# Patient Record
Sex: Female | Born: 1950 | Race: White | Hispanic: No | Smoking: Never smoker
Health system: Southern US, Community
[De-identification: ages and names within clinical notes are randomized; demographics above are authoritative.]

## PROBLEM LIST (undated history)

## (undated) DIAGNOSIS — T4145XA Adverse effect of unspecified anesthetic, initial encounter: Secondary | ICD-10-CM

## (undated) DIAGNOSIS — D469 Myelodysplastic syndrome, unspecified: Secondary | ICD-10-CM

## (undated) DIAGNOSIS — R112 Nausea with vomiting, unspecified: Secondary | ICD-10-CM

## (undated) DIAGNOSIS — T8859XA Other complications of anesthesia, initial encounter: Secondary | ICD-10-CM

## (undated) DIAGNOSIS — N189 Chronic kidney disease, unspecified: Secondary | ICD-10-CM

## (undated) DIAGNOSIS — I00 Rheumatic fever without heart involvement: Secondary | ICD-10-CM

## (undated) DIAGNOSIS — K219 Gastro-esophageal reflux disease without esophagitis: Secondary | ICD-10-CM

## (undated) DIAGNOSIS — M1990 Unspecified osteoarthritis, unspecified site: Secondary | ICD-10-CM

## (undated) DIAGNOSIS — IMO0001 Reserved for inherently not codable concepts without codable children: Secondary | ICD-10-CM

## (undated) DIAGNOSIS — A498 Other bacterial infections of unspecified site: Secondary | ICD-10-CM

## (undated) DIAGNOSIS — I10 Essential (primary) hypertension: Secondary | ICD-10-CM

## (undated) DIAGNOSIS — R768 Other specified abnormal immunological findings in serum: Secondary | ICD-10-CM

## (undated) DIAGNOSIS — A4901 Methicillin susceptible Staphylococcus aureus infection, unspecified site: Secondary | ICD-10-CM

## (undated) DIAGNOSIS — E785 Hyperlipidemia, unspecified: Secondary | ICD-10-CM

## (undated) DIAGNOSIS — G43109 Migraine with aura, not intractable, without status migrainosus: Secondary | ICD-10-CM

## (undated) DIAGNOSIS — Z9889 Other specified postprocedural states: Secondary | ICD-10-CM

## (undated) DIAGNOSIS — K759 Inflammatory liver disease, unspecified: Secondary | ICD-10-CM

## (undated) DIAGNOSIS — Z87442 Personal history of urinary calculi: Secondary | ICD-10-CM

## (undated) DIAGNOSIS — E119 Type 2 diabetes mellitus without complications: Secondary | ICD-10-CM

## (undated) DIAGNOSIS — D649 Anemia, unspecified: Secondary | ICD-10-CM

## (undated) HISTORY — DX: Anemia, unspecified: D64.9

## (undated) HISTORY — PX: ENDOMETRIAL ABLATION: SHX621

## (undated) HISTORY — DX: Reserved for inherently not codable concepts without codable children: IMO0001

## (undated) HISTORY — DX: Unspecified osteoarthritis, unspecified site: M19.90

## (undated) HISTORY — DX: Chronic kidney disease, unspecified: N18.9

## (undated) HISTORY — DX: Migraine with aura, not intractable, without status migrainosus: G43.109

## (undated) HISTORY — DX: Personal history of urinary calculi: Z87.442

## (undated) HISTORY — DX: Methicillin susceptible Staphylococcus aureus infection, unspecified site: A49.01

## (undated) HISTORY — DX: Gastro-esophageal reflux disease without esophagitis: K21.9

## (undated) HISTORY — DX: Other specified abnormal immunological findings in serum: R76.8

## (undated) HISTORY — DX: Other bacterial infections of unspecified site: A49.8

## (undated) HISTORY — PX: CATARACT EXTRACTION: SUR2

## (undated) HISTORY — DX: Type 2 diabetes mellitus without complications: E11.9

## (undated) HISTORY — DX: Myelodysplastic syndrome, unspecified: D46.9

## (undated) HISTORY — DX: Hyperlipidemia, unspecified: E78.5

## (undated) HISTORY — DX: Essential (primary) hypertension: I10

---
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
## 1986-05-14 HISTORY — PX: TUBAL LIGATION: SHX77

## 2005-02-20 ENCOUNTER — Ambulatory Visit: Payer: Self-pay | Admitting: Rheumatology

## 2009-06-30 ENCOUNTER — Ambulatory Visit: Payer: Self-pay | Admitting: Unknown Physician Specialty

## 2009-07-07 ENCOUNTER — Ambulatory Visit: Payer: Self-pay | Admitting: Family Medicine

## 2010-09-14 ENCOUNTER — Ambulatory Visit: Payer: Self-pay | Admitting: Family Medicine

## 2010-09-15 ENCOUNTER — Observation Stay (HOSPITAL_COMMUNITY)
Admission: EM | Admit: 2010-09-15 | Discharge: 2010-09-15 | Disposition: A | Discharge status: Home / Self Care | Payer: BC Managed Care – PPO | Attending: Neurosurgery | Admitting: Neurosurgery

## 2010-09-15 DIAGNOSIS — M51379 Other intervertebral disc degeneration, lumbosacral region without mention of lumbar back pain or lower extremity pain: Principal | ICD-10-CM | POA: Insufficient documentation

## 2010-09-15 DIAGNOSIS — M5137 Other intervertebral disc degeneration, lumbosacral region: Secondary | ICD-10-CM | POA: Insufficient documentation

## 2010-09-17 NOTE — H&P (Signed)
NAME:  Katie Singh, PELAGIO NO.:  [id number]  MEDICAL RECORD NO.:  [id number]           PATIENT TYPE:  O  LOCATION:  [id number]                         FACILITY:  [facility]  PHYSICIAN:  Leeroy Cha, M.D.   DATE OF BIRTH:  [birth year]  DATE OF ADMISSION:  09/15/2010 DATE OF DISCHARGE:  09/15/2010                             HISTORY & PHYSICAL   Ms. Verret is a 60 year old female who my office got call for her to be transfer from emergency because of the possibility of cauda equina syndrome.  The patient was seen by her medical doctor about 2 days ago. At that time according to the chart, the patient was complaining of some short of breath, some chest pain, and also it was mentioned that once in a while, she wears her pants and once in a while she has some poor control of her bowel.  She denies any back pain.  She denies any pain into the legs, although she has a little bit of numbness in the right side of the thigh, which she is claiming secondary to her diabetes. Nevertheless, she ended up having emergency MRI yesterday morning and because of the findings of the MRI, we were called.  I was in surgery, and I was unable to look at the MRI, but the way the patient was described, we told them to keep her n.p.o. and we opened the OR ready to go ahead with surgery.  Finally, the patient came at about 4 o'clock in the afternoon.  She is awake and oriented x3.  She has no symptoms whatsoever.  She is standing and the only complaint that she has is numbness on the right side.  She complains of back pain off and on, but this is something that is cyclical with good days and bad days, but never complained of any shooting pain down to her legs or any pain down to her pelvic area.  In relation to bladder or bowel, she tells me that this is something which is coming to her.  This happened on several occasions, and she thinks that all of this is secondary to her 5 pregnancies.  She has not  been seen by the gynecologist in the past few years.  She denies any chest pain.  She denies any difficulty of breathing.  PAST MEDICAL HISTORY:  She has had 5 pregnancies.  She is taking folic acid, gabapentin, hydrochlorothiazide, Mobic, Paxil, prednisone, Protonix, vitamin 3.  FAMILY HISTORY:  Unremarkable.  SOCIAL HISTORY:  The patient does not smoke.  She occasionally drinks.  REVIEW OF SYSTEMS:  Positive for depression, diabetes, hypertension, and rheumatoid arthritis.  SURGERIES:  She has some type of thermal ablation of the uterus.  PHYSICAL EXAMINATION:  VITAL SIGNS:  Blood pressure 140/76, pulse 76, respiratory rate of 20. HEAD, EARS, NOSE, AND THROAT:  Normal. NECK:  She has good flexibility. CARDIOVASCULAR:  Normal. LUNGS:  Normal. ABDOMEN:  Normal. EXTREMITIES:  Normal pulses. NEURO:  She is oriented x3.  Strength normal in the upper and lower extremities.  Reflexes are symmetrical.  Sensation normal except for some numbness in the outside  of the right thigh.  She has a normal proprioception. MUSCULOSKELETAL:  She is able to walk in tip toes and heel and squat without any problem.  I reviewed the MRI myself first through the internet and later on the CDs she brought.  She has degenerative changes in multiple levels was mild bulging disk laterally in the left side at L5-1.  CLINICAL IMPRESSION:  Degenerative disk disease with a mild bulging disk at L5-1 to the left, which is neurologically asymptomatic and by clinical history or physical examination, there is no evidence of any radiculopathy and no cauda equina syndrome.  I talked to her and her husband who was present.  There is no point to admit her.  I was really worried because with history of chest pain, short of breath, I was worried that I may not be get hold of the cardiologist before surgery if he does know the case.  She is going to go home.  She has my phone number to give me a call as needed.  Of  course, she was advised that part of the workup.  Hopefully, she will go into a weight loss program. Also, I mentioned to her about the need to be seen by the gynecologist to rule out the possibility of cystocele.          ______________________________ Leeroy Cha, M.D.     EB/MEDQ  D:  09/15/2010  T:  09/16/2010  Job:  [id number]  Electronically Signed by Leeroy Cha M.D. on 09/17/2010 08:12:04 PM

## 2010-11-29 ENCOUNTER — Ambulatory Visit: Payer: Self-pay | Admitting: Family Medicine

## 2010-12-04 ENCOUNTER — Ambulatory Visit: Payer: Self-pay

## 2012-02-06 ENCOUNTER — Ambulatory Visit: Payer: Self-pay | Admitting: Family Medicine

## 2012-02-14 ENCOUNTER — Other Ambulatory Visit: Payer: Self-pay | Admitting: Unknown Physician Specialty

## 2012-03-10 ENCOUNTER — Encounter: Payer: Self-pay | Admitting: Unknown Physician Specialty

## 2012-03-10 ENCOUNTER — Ambulatory Visit: Payer: Self-pay | Admitting: Internal Medicine

## 2012-03-10 ENCOUNTER — Ambulatory Visit: Payer: Self-pay | Admitting: Specialist

## 2012-03-11 ENCOUNTER — Ambulatory Visit: Payer: Self-pay | Admitting: Internal Medicine

## 2012-03-11 LAB — CBC CANCER CENTER
Bands: 1 %
Basophil #: 0 x10 3/mm (ref 0.0–0.1)
Eosinophil #: 0.5 x10 3/mm (ref 0.0–0.7)
HGB: 8.6 g/dL — ABNORMAL LOW (ref 12.0–16.0)
Lymphocyte #: 1 x10 3/mm (ref 1.0–3.6)
MCH: 29.7 pg (ref 26.0–34.0)
MCHC: 33 g/dL (ref 32.0–36.0)
MCV: 90 fL (ref 80–100)
Monocyte #: 0.1 x10 3/mm — ABNORMAL LOW (ref 0.2–0.9)
Monocyte %: 4.1 %
Neutrophil #: 1.6 x10 3/mm (ref 1.4–6.5)
Other Cells Blood: 1 %
Platelet: 418 x10 3/mm (ref 150–440)
RBC: 2.89 10*6/uL — ABNORMAL LOW (ref 3.80–5.20)
RDW: 19 % — ABNORMAL HIGH (ref 11.5–14.5)
WBC: 3.2 x10 3/mm — ABNORMAL LOW (ref 3.6–11.0)

## 2012-03-11 LAB — PROTIME-INR
INR: 1
Prothrombin Time: 13.2 s

## 2012-03-11 LAB — FIBRINOGEN: Fibrinogen: 464 mg/dL (ref 210–470)

## 2012-03-11 LAB — RETICULOCYTES
Absolute Retic Count: 0.095 x10 6/uL
Reticulocyte: 3.3 % — ABNORMAL HIGH

## 2012-03-11 LAB — FOLATE: Folic Acid: 62.8 ng/mL

## 2012-03-11 LAB — APTT: Activated PTT: 30.3 secs (ref 23.6–35.9)

## 2012-03-11 LAB — FERRITIN: Ferritin (ARMC): 117 ng/mL

## 2012-03-11 LAB — IRON AND TIBC: Iron Bind.Cap.(Total): 358 ug/dL (ref 250–450)

## 2012-03-11 LAB — FIBRIN DEGRADATION PROD.(ARMC ONLY): Fibrin Degradation Prod.: <10 ug/ml (ref 2.1–7.7)

## 2012-03-11 LAB — LACTATE DEHYDROGENASE: LDH: 308 U/L — ABNORMAL HIGH

## 2012-03-13 LAB — OCCULT BLOOD X 1 CARD TO LAB, STOOL: Occult Blood, Feces: NEGATIVE

## 2012-03-14 ENCOUNTER — Ambulatory Visit: Payer: Self-pay | Admitting: Internal Medicine

## 2012-03-14 ENCOUNTER — Encounter: Payer: Self-pay | Admitting: Unknown Physician Specialty

## 2012-03-26 LAB — CBC CANCER CENTER
Basophil: 2 %
Lymphocytes: 27 %
MCHC: 31.3 g/dL — ABNORMAL LOW (ref 32.0–36.0)
Platelet: 383 x10 3/mm (ref 150–440)
RDW: 17.4 % — ABNORMAL HIGH (ref 11.5–14.5)
Variant Lymphocyte: 4 %

## 2012-04-13 ENCOUNTER — Ambulatory Visit: Payer: Self-pay | Admitting: Internal Medicine

## 2012-04-13 ENCOUNTER — Encounter: Payer: Self-pay | Admitting: Unknown Physician Specialty

## 2012-05-15 ENCOUNTER — Ambulatory Visit: Payer: Self-pay | Admitting: Internal Medicine

## 2012-05-15 LAB — HEPATIC FUNCTION PANEL A (ARMC)
Albumin: 3.1 g/dL — ABNORMAL LOW (ref 3.4–5.0)
SGOT(AST): 23 U/L (ref 15–37)
Total Protein: 8.6 g/dL — ABNORMAL HIGH (ref 6.4–8.2)

## 2012-05-15 LAB — CBC CANCER CENTER
Basophil #: 0.1 x10 3/mm (ref 0.0–0.1)
Basophil %: 1.7 %
Eosinophil %: 9.2 %
HCT: 35.8 % (ref 35.0–47.0)
Lymphocyte #: 1.6 x10 3/mm (ref 1.0–3.6)
Lymphocyte %: 32.3 %
MCHC: 31.6 g/dL — ABNORMAL LOW (ref 32.0–36.0)
Monocyte #: 0.5 x10 3/mm (ref 0.2–0.9)
RBC: 4.61 10*6/uL (ref 3.80–5.20)
RDW: 15.5 % — ABNORMAL HIGH (ref 11.5–14.5)

## 2012-05-15 LAB — CREATININE, SERUM: Creatinine: 2.5 mg/dL — ABNORMAL HIGH (ref 0.60–1.30)

## 2012-06-05 LAB — CBC CANCER CENTER
Eosinophil %: 13 %
HCT: 32.5 % — ABNORMAL LOW (ref 35.0–47.0)
Lymphocyte #: 1.4 x10 3/mm (ref 1.0–3.6)
MCH: 24.1 pg — ABNORMAL LOW (ref 26.0–34.0)
MCHC: 31.8 g/dL — ABNORMAL LOW (ref 32.0–36.0)
MCV: 76 fL — ABNORMAL LOW (ref 80–100)
Monocyte #: 0.3 x10 3/mm (ref 0.2–0.9)
Monocyte %: 8.6 %
Neutrophil %: 40.7 %
Platelet: 193 x10 3/mm (ref 150–440)
RBC: 4.28 10*6/uL (ref 3.80–5.20)
WBC: 3.8 x10 3/mm (ref 3.6–11.0)

## 2012-06-14 ENCOUNTER — Ambulatory Visit: Payer: Self-pay | Admitting: Internal Medicine

## 2012-07-03 LAB — CREATININE, SERUM
Creatinine: 2.82 mg/dL — ABNORMAL HIGH (ref 0.60–1.30)
EGFR (African American): 20 — ABNORMAL LOW

## 2012-07-03 LAB — CBC CANCER CENTER
Monocyte #: 0.4 x10 3/mm (ref 0.2–0.9)
Monocyte %: 8.3 %
Neutrophil %: 54.4 %
RBC: 4.13 10*6/uL (ref 3.80–5.20)
WBC: 5.1 x10 3/mm (ref 3.6–11.0)

## 2012-07-03 LAB — HEPATIC FUNCTION PANEL A (ARMC)
Albumin: 3.5 g/dL (ref 3.4–5.0)
Alkaline Phosphatase: 176 U/L — ABNORMAL HIGH (ref 50–136)
Bilirubin, Direct: 0.1 mg/dL (ref 0.00–0.20)
SGOT(AST): 21 U/L (ref 15–37)
Total Protein: 7.8 g/dL (ref 6.4–8.2)

## 2012-07-12 ENCOUNTER — Ambulatory Visit: Payer: Self-pay | Admitting: Internal Medicine

## 2012-08-14 ENCOUNTER — Ambulatory Visit: Payer: Self-pay | Admitting: Internal Medicine

## 2012-08-14 LAB — CBC CANCER CENTER
Basophil #: 0.1 x10 3/mm (ref 0.0–0.1)
Eosinophil #: 0.3 x10 3/mm (ref 0.0–0.7)
HCT: 32 % — ABNORMAL LOW (ref 35.0–47.0)
Lymphocyte #: 1.4 x10 3/mm (ref 1.0–3.6)
MCH: 24 pg — ABNORMAL LOW (ref 26.0–34.0)
MCHC: 31.9 g/dL — ABNORMAL LOW (ref 32.0–36.0)
Monocyte #: 0.3 x10 3/mm (ref 0.2–0.9)
Monocyte %: 6.8 %
Neutrophil %: 44.2 %
Platelet: 171 x10 3/mm (ref 150–440)
RDW: 16.3 % — ABNORMAL HIGH (ref 11.5–14.5)

## 2012-09-11 ENCOUNTER — Ambulatory Visit: Payer: Self-pay | Admitting: Internal Medicine

## 2012-11-06 ENCOUNTER — Ambulatory Visit: Payer: Self-pay | Admitting: Internal Medicine

## 2012-11-06 LAB — CBC CANCER CENTER
Eosinophil %: 6.3 %
HGB: 10.1 g/dL — ABNORMAL LOW (ref 12.0–16.0)
Lymphocyte %: 33.9 %
RBC: 4.27 10*6/uL (ref 3.80–5.20)
WBC: 3.6 x10 3/mm (ref 3.6–11.0)

## 2012-11-11 ENCOUNTER — Ambulatory Visit: Payer: Self-pay | Admitting: Internal Medicine

## 2013-06-03 ENCOUNTER — Ambulatory Visit: Payer: Self-pay | Admitting: Family Medicine

## 2013-06-03 LAB — RAPID INFLUENZA A&B ANTIGENS (ARMC ONLY)

## 2013-06-03 LAB — RAPID STREP-A WITH REFLX: Micro Text Report: POSITIVE

## 2013-12-22 ENCOUNTER — Ambulatory Visit: Payer: Self-pay | Admitting: Family Medicine

## 2014-01-28 ENCOUNTER — Encounter: Payer: Self-pay | Admitting: Rheumatology

## 2014-02-11 ENCOUNTER — Encounter: Payer: Self-pay | Admitting: Rheumatology

## 2014-03-29 DIAGNOSIS — K529 Noninfective gastroenteritis and colitis, unspecified: Secondary | ICD-10-CM | POA: Insufficient documentation

## 2014-11-07 ENCOUNTER — Other Ambulatory Visit: Payer: Self-pay | Admitting: Family Medicine

## 2014-11-11 ENCOUNTER — Other Ambulatory Visit: Payer: Self-pay | Admitting: Family Medicine

## 2014-11-11 MED ORDER — TRAMADOL HCL 50 MG PO TABS: 50.0000 mg | ORAL_TABLET | Freq: Three times a day (TID) | ORAL | Status: DC | PRN

## 2014-11-11 NOTE — Telephone Encounter (Signed)
Called in [place]

## 2014-12-29 ENCOUNTER — Telehealth: Payer: Self-pay

## 2014-12-29 DIAGNOSIS — E785 Hyperlipidemia, unspecified: Secondary | ICD-10-CM | POA: Insufficient documentation

## 2014-12-29 DIAGNOSIS — G4730 Sleep apnea, unspecified: Secondary | ICD-10-CM | POA: Insufficient documentation

## 2014-12-29 DIAGNOSIS — I10 Essential (primary) hypertension: Secondary | ICD-10-CM | POA: Insufficient documentation

## 2014-12-29 DIAGNOSIS — E1169 Type 2 diabetes mellitus with other specified complication: Secondary | ICD-10-CM | POA: Insufficient documentation

## 2014-12-29 MED ORDER — MELOXICAM 15 MG PO TABS: 15.0000 mg | ORAL_TABLET | Freq: Every day | ORAL | Status: DC

## 2014-12-29 NOTE — Telephone Encounter (Signed)
Requesting refill for Meloxicam

## 2015-02-09 ENCOUNTER — Other Ambulatory Visit: Payer: Self-pay | Admitting: Family Medicine

## 2015-02-20 ENCOUNTER — Other Ambulatory Visit: Payer: Self-pay | Admitting: Family Medicine

## 2015-03-17 ENCOUNTER — Encounter: Payer: Self-pay | Admitting: Family Medicine

## 2015-04-05 ENCOUNTER — Other Ambulatory Visit: Payer: Self-pay | Admitting: Family Medicine

## 2015-04-11 ENCOUNTER — Other Ambulatory Visit: Payer: Self-pay | Admitting: Family Medicine

## 2015-04-26 ENCOUNTER — Encounter: Payer: Self-pay | Admitting: Family Medicine

## 2015-04-26 ENCOUNTER — Ambulatory Visit (INDEPENDENT_AMBULATORY_CARE_PROVIDER_SITE_OTHER): Payer: PPO | Admitting: Family Medicine

## 2015-04-26 VITALS — BP 136/83 | HR 77 | Temp 97.6°F | Ht 67.8 in | Wt 282.0 lb

## 2015-04-26 DIAGNOSIS — IMO0001 Reserved for inherently not codable concepts without codable children: Secondary | ICD-10-CM

## 2015-04-26 DIAGNOSIS — N184 Chronic kidney disease, stage 4 (severe): Secondary | ICD-10-CM

## 2015-04-26 DIAGNOSIS — E785 Hyperlipidemia, unspecified: Secondary | ICD-10-CM | POA: Diagnosis not present

## 2015-04-26 DIAGNOSIS — E1122 Type 2 diabetes mellitus with diabetic chronic kidney disease: Secondary | ICD-10-CM

## 2015-04-26 DIAGNOSIS — Z0000 Encounter for general adult medical examination without abnormal findings: Secondary | ICD-10-CM

## 2015-04-26 DIAGNOSIS — Q069 Congenital malformation of spinal cord, unspecified: Secondary | ICD-10-CM

## 2015-04-26 DIAGNOSIS — G4730 Sleep apnea, unspecified: Secondary | ICD-10-CM

## 2015-04-26 DIAGNOSIS — Z1231 Encounter for screening mammogram for malignant neoplasm of breast: Secondary | ICD-10-CM | POA: Diagnosis not present

## 2015-04-26 DIAGNOSIS — D469 Myelodysplastic syndrome, unspecified: Secondary | ICD-10-CM

## 2015-04-26 DIAGNOSIS — Z23 Encounter for immunization: Secondary | ICD-10-CM

## 2015-04-26 DIAGNOSIS — I10 Essential (primary) hypertension: Secondary | ICD-10-CM | POA: Diagnosis not present

## 2015-04-26 LAB — URINALYSIS, ROUTINE W REFLEX MICROSCOPIC
Bilirubin, UA: NEGATIVE
GLUCOSE, UA: NEGATIVE
Ketones, UA: NEGATIVE
LEUKOCYTES UA: NEGATIVE
Nitrite, UA: NEGATIVE
PH UA: 5 (ref 5.0–7.5)
PROTEIN UA: NEGATIVE
RBC, UA: NEGATIVE
Specific Gravity, UA: 1.01 (ref 1.005–1.030)
Urobilinogen, Ur: 0.2 mg/dL (ref 0.2–1.0)

## 2015-04-26 LAB — BAYER DCA HB A1C WAIVED: HB A1C (BAYER DCA - WAIVED): 6.9 % (ref <7.0)

## 2015-04-26 MED ORDER — DIAZEPAM 5 MG PO TABS: 5.0000 mg | ORAL_TABLET | Freq: Every day | ORAL | Status: DC | PRN

## 2015-04-26 MED ORDER — PAROXETINE HCL 30 MG PO TABS: ORAL_TABLET | ORAL | Status: DC

## 2015-04-26 MED ORDER — DILTIAZEM HCL ER COATED BEADS 180 MG PO CP24: 180.0000 mg | ORAL_CAPSULE | Freq: Every day | ORAL | Status: DC

## 2015-04-26 MED ORDER — TRAMADOL HCL 50 MG PO TABS: 50.0000 mg | ORAL_TABLET | Freq: Two times a day (BID) | ORAL | Status: DC | PRN

## 2015-04-26 MED ORDER — GABAPENTIN 300 MG PO CAPS: 300.0000 mg | ORAL_CAPSULE | Freq: Three times a day (TID) | ORAL | Status: DC

## 2015-04-26 MED ORDER — FUROSEMIDE 40 MG PO TABS: 40.0000 mg | ORAL_TABLET | Freq: Every day | ORAL | Status: DC

## 2015-04-26 NOTE — Assessment & Plan Note (Signed)
The current medical regimen is effective;  continue present plan and medications.  

## 2015-04-26 NOTE — Assessment & Plan Note (Signed)
Followed by hematology 

## 2015-04-26 NOTE — Assessment & Plan Note (Signed)
Renal failure managed by kidney doctors

## 2015-04-26 NOTE — Progress Notes (Signed)
BP 136/83 mmHg  Pulse 77  Temp(Src) 97.6 F (36.4 C)  Ht 5' 7.8" (1.722 m)  Wt 282 lb (127.914 kg)  BMI 43.14 kg/m2  SpO2 97%   Subjective:    Patient ID: Katie Singh, female    DOB: Sep 12, 1950, 64 y.o.   MRN: [id number]  HPI: Katie Singh is a 64 y.o. female  Chief Complaint  Patient presents with  . Annual Exam   welcome to [facility] met Patient with several months worth of right lower quadrant abdominal pain occasional or pain that may last up to an hour or so sometimes longer not associated with any other changes no blood in stool or urine no other bowel symptoms no urinary symptoms Nothing seems to make it worse or better. Patient taking tramadol for occasional aches and pains especially arthritis in her hands takes it rarely itches appropriate Takes rare Valium for high stress days Blood pressures been doing well Gabapentin seems a doing okay And Plaquenil as prescribed by rheumatology Relevant past medical, surgical, family and social history reviewed and updated as indicated. Interim medical history since our last visit reviewed. Allergies and medications reviewed and updated.  Other than noted above Review of Systems  Constitutional: Negative.   HENT: Negative.   Eyes: Negative.   Respiratory: Negative.   Cardiovascular: Negative.   Gastrointestinal: Negative.   Endocrine: Negative.   Genitourinary: Negative.   Musculoskeletal: Negative.   Skin: Negative.   Allergic/Immunologic: Negative.   Neurological: Negative.   Hematological: Negative.   Psychiatric/Behavioral: Negative.     Per HPI unless specifically indicated above     Objective:    BP 136/83 mmHg  Pulse 77  Temp(Src) 97.6 F (36.4 C)  Ht 5' 7.8" (1.722 m)  Wt 282 lb (127.914 kg)  BMI 43.14 kg/m2  SpO2 97%  Wt Readings from Last 3 Encounters:  04/26/15 282 lb (127.914 kg)  08/16/14 257 lb (116.574 kg)    Physical Exam  Constitutional: She is oriented to person, place,  and time. She appears well-developed and well-nourished.  HENT:  Head: Normocephalic and atraumatic.  Right Ear: External ear normal.  Left Ear: External ear normal.  Nose: Nose normal.  Mouth/Throat: Oropharynx is clear and moist.  Eyes: Conjunctivae and EOM are normal. Pupils are equal, round, and reactive to light.  Neck: Normal range of motion. Neck supple. Carotid bruit is not present.  Cardiovascular: Normal rate, regular rhythm and normal heart sounds.   No murmur heard. Pulmonary/Chest: Effort normal and breath sounds normal. She exhibits no mass. Right breast exhibits no mass, no skin change and no tenderness. Left breast exhibits no mass, no skin change and no tenderness. Breasts are symmetrical.  Abdominal: Soft. Bowel sounds are normal. There is no hepatosplenomegaly.  Musculoskeletal: Normal range of motion.  Neurological: She is alert and oriented to person, place, and time.  Skin: No rash noted.  Psychiatric: She has a normal mood and affect. Her behavior is normal. Judgment and thought content normal.        Assessment & Plan:   Problem List Items Addressed This Visit      Cardiovascular and Mediastinum   Hypertension - Primary    The current medical regimen is effective;  continue present plan and medications.       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       Relevant Medications   diltiazem (CARDIZEM CD) 180 MG 24 hr capsule   furosemide (LASIX) 40 MG tablet   Other Relevant Orders   Comprehensive metabolic panel   Lipid panel    Other Visit Diagnoses    Immunization due        Relevant Orders    Pneumococcal conjugate vaccine 13-valent IM (Completed)    Encounter for screening mammogram for breast cancer        Relevant Orders    MM Digital Screening    PE (physical exam), annual        Relevant Orders    EKG 12-Lead (Completed)       Patient will work on living will and will Follow up plan: Return in about 6 months (around 10/25/2015), or if symptoms worsen or fail to improve, for For A1c, BMP, lipids, ALT, AST.

## 2015-04-26 NOTE — Assessment & Plan Note (Signed)
Refuses CPAP 

## 2015-04-27 ENCOUNTER — Encounter: Payer: Self-pay | Admitting: Family Medicine

## 2015-04-27 LAB — LIPID PANEL
CHOL/HDL RATIO: 4.1 ratio (ref 0.0–4.4)
CHOLESTEROL TOTAL: 147 mg/dL (ref 100–199)
HDL: 36 mg/dL — AB (ref >39)
LDL CALC: 88 mg/dL (ref 0–99)
TRIGLYCERIDES: 116 mg/dL (ref 0–149)
VLDL CHOLESTEROL CAL: 23 mg/dL (ref 5–40)

## 2015-04-27 LAB — COMPREHENSIVE METABOLIC PANEL
ALK PHOS: 140 IU/L — AB (ref 39–117)
ALT: 17 IU/L (ref 0–32)
AST: 20 IU/L (ref 0–40)
Albumin/Globulin Ratio: 1.3 (ref 1.1–2.5)
Albumin: 4.1 g/dL (ref 3.6–4.8)
BUN/Creatinine Ratio: 11 (ref 11–26)
BUN: 21 mg/dL (ref 8–27)
Bilirubin Total: 0.3 mg/dL (ref 0.0–1.2)
CALCIUM: 8.9 mg/dL (ref 8.7–10.3)
CO2: 25 mmol/L (ref 18–29)
CREATININE: 1.9 mg/dL — AB (ref 0.57–1.00)
Chloride: 99 mmol/L (ref 96–106)
GFR calc Af Amer: 31 mL/min/{1.73_m2} — ABNORMAL LOW (ref >59)
GFR, EST NON AFRICAN AMERICAN: 27 mL/min/{1.73_m2} — AB (ref >59)
GLOBULIN, TOTAL: 3.1 g/dL (ref 1.5–4.5)
GLUCOSE: 97 mg/dL (ref 65–99)
Potassium: 4.4 mmol/L (ref 3.5–5.2)
Sodium: 140 mmol/L (ref 134–144)
Total Protein: 7.2 g/dL (ref 6.0–8.5)

## 2015-04-27 LAB — CBC WITH DIFFERENTIAL/PLATELET
BASOS: 1 %
Basophils Absolute: 0.1 10*3/uL (ref 0.0–0.2)
EOS (ABSOLUTE): 0.2 10*3/uL (ref 0.0–0.4)
EOS: 4 %
HEMATOCRIT: 36.3 % (ref 34.0–46.6)
Hemoglobin: 11.9 g/dL (ref 11.1–15.9)
IMMATURE GRANS (ABS): 0 10*3/uL (ref 0.0–0.1)
IMMATURE GRANULOCYTES: 0 %
LYMPHS: 29 %
Lymphocytes Absolute: 1.4 10*3/uL (ref 0.7–3.1)
MCH: 25.4 pg — ABNORMAL LOW (ref 26.6–33.0)
MCHC: 32.8 g/dL (ref 31.5–35.7)
MCV: 77 fL — AB (ref 79–97)
MONOCYTES: 12 %
Monocytes Absolute: 0.6 10*3/uL (ref 0.1–0.9)
NEUTROS PCT: 54 %
Neutrophils Absolute: 2.6 10*3/uL (ref 1.4–7.0)
PLATELETS: 258 10*3/uL (ref 150–379)
RBC: 4.69 x10E6/uL (ref 3.77–5.28)
RDW: 14.1 % (ref 12.3–15.4)
WBC: 4.8 10*3/uL (ref 3.4–10.8)

## 2015-04-27 LAB — TSH: TSH: 3.75 u[IU]/mL (ref 0.450–4.500)

## 2015-06-02 ENCOUNTER — Encounter (HOSPITAL_COMMUNITY): Payer: Self-pay

## 2015-07-05 ENCOUNTER — Encounter: Payer: Self-pay | Admitting: Family Medicine

## 2015-07-08 ENCOUNTER — Telehealth: Payer: Self-pay | Admitting: Family Medicine

## 2015-07-08 DIAGNOSIS — F324 Major depressive disorder, single episode, in partial remission: Secondary | ICD-10-CM | POA: Diagnosis not present

## 2015-07-08 DIAGNOSIS — N189 Chronic kidney disease, unspecified: Secondary | ICD-10-CM | POA: Diagnosis not present

## 2015-07-08 DIAGNOSIS — Z6841 Body Mass Index (BMI) 40.0 and over, adult: Secondary | ICD-10-CM | POA: Diagnosis not present

## 2015-07-08 DIAGNOSIS — Z0000 Encounter for general adult medical examination without abnormal findings: Secondary | ICD-10-CM | POA: Diagnosis not present

## 2015-07-08 DIAGNOSIS — R9431 Abnormal electrocardiogram [ECG] [EKG]: Secondary | ICD-10-CM | POA: Diagnosis not present

## 2015-07-08 DIAGNOSIS — I129 Hypertensive chronic kidney disease with stage 1 through stage 4 chronic kidney disease, or unspecified chronic kidney disease: Secondary | ICD-10-CM | POA: Diagnosis not present

## 2015-07-08 NOTE — Telephone Encounter (Signed)
Beverlee Nims called in and stated that the pt had back pain, chest pain and a headache. She stated that the pts manually taken BP was 189/87 and the automatically taken BP was 180/84. After speaking with the Dr Wynetta Emery I advised Ms Beverlee Nims to have the pt go to the ED.

## 2015-07-11 ENCOUNTER — Encounter: Payer: Self-pay | Admitting: Family Medicine

## 2015-07-11 ENCOUNTER — Ambulatory Visit (INDEPENDENT_AMBULATORY_CARE_PROVIDER_SITE_OTHER): Payer: PPO | Admitting: Family Medicine

## 2015-07-11 VITALS — BP 167/89 | HR 76 | Temp 98.8°F | Ht 67.2 in | Wt 284.0 lb

## 2015-07-11 DIAGNOSIS — I10 Essential (primary) hypertension: Secondary | ICD-10-CM | POA: Diagnosis not present

## 2015-07-11 DIAGNOSIS — J069 Acute upper respiratory infection, unspecified: Secondary | ICD-10-CM | POA: Diagnosis not present

## 2015-07-11 MED ORDER — FEXOFENADINE HCL 180 MG PO TABS: 180.0000 mg | ORAL_TABLET | Freq: Every day | ORAL | Status: DC

## 2015-07-11 MED ORDER — LOSARTAN POTASSIUM 25 MG PO TABS: 25.0000 mg | ORAL_TABLET | Freq: Every day | ORAL | Status: DC

## 2015-07-11 MED ORDER — HYDROCOD POLST-CPM POLST ER 10-8 MG/5ML PO SUER: 5.0000 mL | Freq: Every evening | ORAL | Status: DC | PRN

## 2015-07-11 NOTE — Progress Notes (Signed)
BP 167/89 mmHg  Pulse 76  Temp(Src) 98.8 F (37.1 C)  Ht 5' 7.2" (1.707 m)  Wt 284 lb (128.822 kg)  BMI 44.21 kg/m2  SpO2 96%   Subjective:    Patient ID: Katie Singh, female    DOB: [birth year], 65 y.o.   MRN: [id number]  HPI: Katie Singh is a 65 y.o. female  Chief Complaint  Patient presents with  . Hypertension  . URI   HYPERTENSION Hypertension status: exacerbated  Satisfied with current treatment? no Duration of hypertension: chronic BP monitoring frequency:  a few times a week BP range: 169/104 this AM, at the nursing home 160s/120s, 170-80s/80s-100s BP medication side effects:  no Medication compliance: excellent compliance Aspirin: yes Recurrent headaches: yes Visual changes: yes- chronic Palpitations: yes Dyspnea: yes Chest pain: yes Lower extremity edema: yes Dizzy/lightheaded: no  UPPER RESPIRATORY TRACT INFECTION Duration: 3-4 weeks Worst symptom: headaches Fever: no Cough: yes Shortness of breath: no Wheezing: yes Chest pain: yes, with cough Chest tightness: yes Chest congestion: yes Nasal congestion: yes Runny nose: no Post nasal drip: yes Sneezing: yes Sore throat: no Swollen glands: no Sinus pressure: yes Headache: yes Face pain: yes  R maxillary Toothache: yes Ear pain: no  Ear pressure: no  Eyes red/itching:no Eye drainage/crusting: no  Vomiting: no Rash: no Fatigue: yes Sick contacts: yes Strep contacts: no  Context: worse Recurrent sinusitis: no Relief with OTC cold/cough medications: no  Treatments attempted: none '  Relevant past medical, surgical, family and social history reviewed and updated as indicated. Interim medical history since our last visit reviewed. Allergies and medications reviewed and updated.  Review of Systems  Constitutional: Negative.   HENT: Negative.   Respiratory: Negative.   Cardiovascular: Negative.   Psychiatric/Behavioral: Negative.     Per HPI unless specifically indicated  above     Objective:    BP 167/89 mmHg  Pulse 76  Temp(Src) 98.8 F (37.1 C)  Ht 5' 7.2" (1.707 m)  Wt 284 lb (128.822 kg)  BMI 44.21 kg/m2  SpO2 96%  Wt Readings from Last 3 Encounters:  07/11/15 284 lb (128.822 kg)  04/26/15 282 lb (127.914 kg)  08/16/14 257 lb (116.574 kg)    Physical Exam  Constitutional: She is oriented to person, place, and time. She appears well-developed and well-nourished. No distress.  HENT:  Head: Normocephalic and atraumatic.  Right Ear: Hearing and external ear normal.  Left Ear: Hearing and external ear normal.  Nose: Nose normal.  Mouth/Throat: Oropharynx is clear and moist. No oropharyngeal exudate.  Eyes: Conjunctivae, EOM and lids are normal. Pupils are equal, round, and reactive to light. Right eye exhibits no discharge. Left eye exhibits no discharge. No scleral icterus.  Neck: Normal range of motion. Neck supple. No JVD present. No tracheal deviation present. No thyromegaly present.  Cardiovascular: Normal rate, regular rhythm, normal heart sounds and intact distal pulses.  Exam reveals no gallop and no friction rub.   No murmur heard. Pulmonary/Chest: Effort normal and breath sounds normal. No stridor. No respiratory distress. She has no wheezes. She has no rales. She exhibits no tenderness.  Musculoskeletal: Normal range of motion.  Lymphadenopathy:    She has no cervical adenopathy.  Neurological: She is alert and oriented to person, place, and time.  Skin: Skin is warm, dry and intact. No rash noted. No erythema. No pallor.  Psychiatric: She has a normal mood and affect. Her speech is normal and behavior is normal. Judgment and thought content normal. Cognition  and memory are normal.  Nursing note and vitals reviewed.   Results for orders placed or performed in visit on 04/26/15  Comprehensive metabolic panel  Result Value Ref Range   Glucose 97 65 - 99 mg/dL   BUN 21 8 - 27 mg/dL   Creatinine, Ser 1.90 (H) 0.57 - 1.00 mg/dL    GFR calc non Af Amer 27 (L) >59 mL/min/1.73   GFR calc Af Amer 31 (L) >59 mL/min/1.73   BUN/Creatinine Ratio 11 11 - 26   Sodium 140 134 - 144 mmol/L   Potassium 4.4 3.5 - 5.2 mmol/L   Chloride 99 96 - 106 mmol/L   CO2 25 18 - 29 mmol/L   Calcium 8.9 8.7 - 10.3 mg/dL   Total Protein 7.2 6.0 - 8.5 g/dL   Albumin 4.1 3.6 - 4.8 g/dL   Globulin, Total 3.1 1.5 - 4.5 g/dL   Albumin/Globulin Ratio 1.3 1.1 - 2.5   Bilirubin Total 0.3 0.0 - 1.2 mg/dL   Alkaline Phosphatase 140 (H) 39 - 117 IU/L   AST 20 0 - 40 IU/L   ALT 17 0 - 32 IU/L  Lipid panel  Result Value Ref Range   Cholesterol, Total 147 100 - 199 mg/dL   Triglycerides 116 0 - 149 mg/dL   HDL 36 (L) >39 mg/dL   VLDL Cholesterol Cal 23 5 - 40 mg/dL   LDL Calculated 88 0 - 99 mg/dL   Chol/HDL Ratio 4.1 0.0 - 4.4 ratio units  CBC with Differential/Platelet  Result Value Ref Range   WBC 4.8 3.4 - 10.8 x10E3/uL   RBC 4.69 3.77 - 5.28 x10E6/uL   Hemoglobin 11.9 11.1 - 15.9 g/dL   Hematocrit 36.3 34.0 - 46.6 %   MCV 77 (L) 79 - 97 fL   MCH 25.4 (L) 26.6 - 33.0 pg   MCHC 32.8 31.5 - 35.7 g/dL   RDW 14.1 12.3 - 15.4 %   Platelets 258 150 - 379 x10E3/uL   Neutrophils 54 %   Lymphs 29 %   Monocytes 12 %   Eos 4 %   Basos 1 %   Neutrophils Absolute 2.6 1.4 - 7.0 x10E3/uL   Lymphocytes Absolute 1.4 0.7 - 3.1 x10E3/uL   Monocytes Absolute 0.6 0.1 - 0.9 x10E3/uL   EOS (ABSOLUTE) 0.2 0.0 - 0.4 x10E3/uL   Basophils Absolute 0.1 0.0 - 0.2 x10E3/uL   Immature Granulocytes 0 %   Immature Grans (Abs) 0.0 0.0 - 0.1 x10E3/uL  Bayer DCA Hb A1c Waived  Result Value Ref Range   Bayer DCA Hb A1c Waived 6.9 <7.0 %  TSH  Result Value Ref Range   TSH 3.750 0.450 - 4.500 uIU/mL  Urinalysis, Routine w reflex microscopic (not at [facility])  Result Value Ref Range   Specific Gravity, UA 1.010 1.005 - 1.030   pH, UA 5.0 5.0 - 7.5   Color, UA Yellow Yellow   Appearance Ur Clear Clear   Leukocytes, UA Negative Negative   Protein, UA Negative  Negative/Trace   Glucose, UA Negative Negative   Ketones, UA Negative Negative   RBC, UA Negative Negative   Bilirubin, UA Negative Negative   Urobilinogen, Ur 0.2 0.2 - 1.0 mg/dL   Nitrite, UA Negative Negative      Assessment & Plan:   Problem List Items Addressed This Visit      Cardiovascular and Mediastinum   Hypertension - Primary    Not under good control. Will restart losartan and recheck in 1  month. Continue to monitor.       Relevant Medications   losartan (COZAAR) 25 MG tablet    Other Visit Diagnoses    Upper respiratory infection        Will treat with tussionex. No sign of bacterial infection. Call if not getting better or getting worse.         Follow up plan: Return in about 4 weeks (around 08/08/2015) for BP recheck.

## 2015-07-11 NOTE — Assessment & Plan Note (Signed)
Not under good control. Will restart losartan and recheck in 1 month. Continue to monitor.

## 2015-07-28 ENCOUNTER — Other Ambulatory Visit: Payer: Self-pay | Admitting: Family Medicine

## 2015-07-28 NOTE — Telephone Encounter (Signed)
Call in rx  

## 2015-08-08 ENCOUNTER — Encounter: Payer: Self-pay | Admitting: Family Medicine

## 2015-08-08 ENCOUNTER — Ambulatory Visit (INDEPENDENT_AMBULATORY_CARE_PROVIDER_SITE_OTHER): Payer: PPO | Admitting: Family Medicine

## 2015-08-08 VITALS — BP 137/86 | HR 91 | Temp 98.3°F | Ht 69.0 in | Wt 285.0 lb

## 2015-08-08 DIAGNOSIS — E785 Hyperlipidemia, unspecified: Secondary | ICD-10-CM

## 2015-08-08 DIAGNOSIS — N184 Chronic kidney disease, stage 4 (severe): Secondary | ICD-10-CM

## 2015-08-08 DIAGNOSIS — I10 Essential (primary) hypertension: Secondary | ICD-10-CM

## 2015-08-08 DIAGNOSIS — E1122 Type 2 diabetes mellitus with diabetic chronic kidney disease: Secondary | ICD-10-CM

## 2015-08-08 MED ORDER — LOSARTAN POTASSIUM 25 MG PO TABS: 25.0000 mg | ORAL_TABLET | Freq: Every day | ORAL | Status: DC

## 2015-08-08 NOTE — Progress Notes (Signed)
BP 137/86 mmHg  Pulse 91  Temp(Src) 98.3 F (36.8 C)  Ht 5\' 9"  (1.753 m)  Wt 285 lb (129.275 kg)  BMI 42.07 kg/m2  SpO2 98%   Subjective:    Patient ID: Katie Singh, female    DOB: 1950-06-29, 65 y.o.   MRN: [id number]  HPI: [name] is a 65 y.o. female  Chief Complaint  Patient presents with  . Hypertension   blood pressures been doing well in spite of great deal of personal family stress No issues with medications Blood sugar occasionally checked his been doing okay last A1c was 6.9 patient diet-controlled diabetes Renal function has been stable depression able in spite of high stress environment  Relevant past medical, surgical, family and social history reviewed and updated as indicated. Interim medical history since our last visit reviewed. Allergies and medications reviewed and updated.  Review of Systems  Constitutional: Negative.   Respiratory: Negative.   Cardiovascular: Negative.     Per HPI unless specifically indicated above     Objective:    BP 137/86 mmHg  Pulse 91  Temp(Src) 98.3 F (36.8 C)  Ht 5\' 9"  (1.753 m)  Wt 285 lb (129.275 kg)  BMI 42.07 kg/m2  SpO2 98%  Wt Readings from Last 3 Encounters:  08/08/15 285 lb (129.275 kg)  07/11/15 284 lb (128.822 kg)  04/26/15 282 lb (127.914 kg)    Physical Exam  Constitutional: She is oriented to person, place, and time. She appears well-developed and well-nourished. No distress.  HENT:  Head: Normocephalic and atraumatic.  Right Ear: Hearing normal.  Left Ear: Hearing normal.  Nose: Nose normal.  Eyes: Conjunctivae and lids are normal. Right eye exhibits no discharge. Left eye exhibits no discharge. No scleral icterus.  Cardiovascular: Normal rate, regular rhythm and normal heart sounds.   Pulmonary/Chest: Effort normal and breath sounds normal. No respiratory distress.  Musculoskeletal: Normal range of motion.  Neurological: She is alert and oriented to person, place, and time.   Skin: Skin is intact. No rash noted.  Psychiatric: She has a normal mood and affect. Her speech is normal and behavior is normal. Judgment and thought content normal. Cognition and memory are normal.    Results for orders placed or performed in visit on 04/26/15  Comprehensive metabolic panel  Result Value Ref Range   Glucose 97 65 - 99 mg/dL   BUN 21 8 - 27 mg/dL   Creatinine, Ser 1.90 (H) 0.57 - 1.00 mg/dL   GFR calc non Af Amer 27 (L) >59 mL/min/1.73   GFR calc Af Amer 31 (L) >59 mL/min/1.73   BUN/Creatinine Ratio 11 11 - 26   Sodium 140 134 - 144 mmol/L   Potassium 4.4 3.5 - 5.2 mmol/L   Chloride 99 96 - 106 mmol/L   CO2 25 18 - 29 mmol/L   Calcium 8.9 8.7 - 10.3 mg/dL   Total Protein 7.2 6.0 - 8.5 g/dL   Albumin 4.1 3.6 - 4.8 g/dL   Globulin, Total 3.1 1.5 - 4.5 g/dL   Albumin/Globulin Ratio 1.3 1.1 - 2.5   Bilirubin Total 0.3 0.0 - 1.2 mg/dL   Alkaline Phosphatase 140 (H) 39 - 117 IU/L   AST 20 0 - 40 IU/L   ALT 17 0 - 32 IU/L  Lipid panel  Result Value Ref Range   Cholesterol, Total 147 100 - 199 mg/dL   Triglycerides 116 0 - 149 mg/dL   HDL 36 (L) >39 mg/dL   VLDL  Cholesterol Cal 23 5 - 40 mg/dL   LDL Calculated 88 0 - 99 mg/dL   Chol/HDL Ratio 4.1 0.0 - 4.4 ratio units  CBC with Differential/Platelet  Result Value Ref Range   WBC 4.8 3.4 - 10.8 x10E3/uL   RBC 4.69 3.77 - 5.28 x10E6/uL   Hemoglobin 11.9 11.1 - 15.9 g/dL   Hematocrit 36.3 34.0 - 46.6 %   MCV 77 (L) 79 - 97 fL   MCH 25.4 (L) 26.6 - 33.0 pg   MCHC 32.8 31.5 - 35.7 g/dL   RDW 14.1 12.3 - 15.4 %   Platelets 258 150 - 379 x10E3/uL   Neutrophils 54 %   Lymphs 29 %   Monocytes 12 %   Eos 4 %   Basos 1 %   Neutrophils Absolute 2.6 1.4 - 7.0 x10E3/uL   Lymphocytes Absolute 1.4 0.7 - 3.1 x10E3/uL   Monocytes Absolute 0.6 0.1 - 0.9 x10E3/uL   EOS (ABSOLUTE) 0.2 0.0 - 0.4 x10E3/uL   Basophils Absolute 0.1 0.0 - 0.2 x10E3/uL   Immature Granulocytes 0 %   Immature Grans (Abs) 0.0 0.0 - 0.1 x10E3/uL   Bayer DCA Hb A1c Waived  Result Value Ref Range   Bayer DCA Hb A1c Waived 6.9 <7.0 %  TSH  Result Value Ref Range   TSH 3.750 0.450 - 4.500 uIU/mL  Urinalysis, Routine w reflex microscopic (not at [facility])  Result Value Ref Range   Specific Gravity, UA 1.010 1.005 - 1.030   pH, UA 5.0 5.0 - 7.5   Color, UA Yellow Yellow   Appearance Ur Clear Clear   Leukocytes, UA Negative Negative   Protein, UA Negative Negative/Trace   Glucose, UA Negative Negative   Ketones, UA Negative Negative   RBC, UA Negative Negative   Bilirubin, UA Negative Negative   Urobilinogen, Ur 0.2 0.2 - 1.0 mg/dL   Nitrite, UA Negative Negative      Assessment & Plan:   Problem List Items Addressed This Visit      Cardiovascular and Mediastinum   Hypertension - Primary    The current medical regimen is effective;  continue present plan and medications.       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       Relevant Medications   losartan (COZAAR) 25 MG tablet       Follow up plan: Return for As scheduled.

## 2015-08-08 NOTE — Assessment & Plan Note (Signed)
Stable for now

## 2015-08-08 NOTE — Assessment & Plan Note (Signed)
The current medical regimen is effective;  continue present plan and medications.  

## 2015-09-04 ENCOUNTER — Other Ambulatory Visit: Payer: Self-pay | Admitting: Family Medicine

## 2015-09-08 ENCOUNTER — Telehealth: Payer: Self-pay | Admitting: Family Medicine

## 2015-09-08 MED ORDER — CIPROFLOXACIN HCL 250 MG PO TABS: 250.0000 mg | ORAL_TABLET | Freq: Two times a day (BID) | ORAL | Status: DC

## 2015-09-08 NOTE — Telephone Encounter (Signed)
Pt called stated she believes she has a bladder infection, pt stated see has seen Dr. Jeananne Rama for this issue before. Would like to know if something can be called in for her. Pharm is [facility] in [place]. Thanks.

## 2015-09-21 DIAGNOSIS — E119 Type 2 diabetes mellitus without complications: Secondary | ICD-10-CM | POA: Diagnosis not present

## 2015-09-21 DIAGNOSIS — H25019 Cortical age-related cataract, unspecified eye: Secondary | ICD-10-CM | POA: Diagnosis not present

## 2015-09-22 LAB — HM DIABETES EYE EXAM

## 2015-09-29 ENCOUNTER — Telehealth: Payer: Self-pay | Admitting: Family Medicine

## 2015-09-29 MED ORDER — CIPROFLOXACIN HCL 250 MG PO TABS: 250.0000 mg | ORAL_TABLET | Freq: Two times a day (BID) | ORAL | Status: DC

## 2015-09-29 NOTE — Telephone Encounter (Signed)
Routing to provider  

## 2015-09-29 NOTE — Telephone Encounter (Signed)
Called and left patient a voicemail letting her know another round of antibiotic was sent in.

## 2015-09-29 NOTE — Telephone Encounter (Signed)
Pt called and stated that she believes she still has a bladder infection due to burning and she would like to know if she can have another round of antibiotics called in to [facility]

## 2015-10-20 DIAGNOSIS — H2512 Age-related nuclear cataract, left eye: Secondary | ICD-10-CM | POA: Diagnosis not present

## 2015-10-20 DIAGNOSIS — H25011 Cortical age-related cataract, right eye: Secondary | ICD-10-CM | POA: Diagnosis not present

## 2015-10-20 DIAGNOSIS — H2513 Age-related nuclear cataract, bilateral: Secondary | ICD-10-CM | POA: Diagnosis not present

## 2015-10-20 DIAGNOSIS — H25012 Cortical age-related cataract, left eye: Secondary | ICD-10-CM | POA: Diagnosis not present

## 2015-10-20 DIAGNOSIS — H2511 Age-related nuclear cataract, right eye: Secondary | ICD-10-CM | POA: Diagnosis not present

## 2015-10-20 LAB — HM DIABETES EYE EXAM

## 2015-10-25 ENCOUNTER — Ambulatory Visit: Payer: PPO | Admitting: Family Medicine

## 2015-11-01 ENCOUNTER — Ambulatory Visit (INDEPENDENT_AMBULATORY_CARE_PROVIDER_SITE_OTHER): Payer: PPO | Admitting: Family Medicine

## 2015-11-01 ENCOUNTER — Encounter: Payer: Self-pay | Admitting: Family Medicine

## 2015-11-01 ENCOUNTER — Telehealth: Payer: Self-pay | Admitting: Family Medicine

## 2015-11-01 ENCOUNTER — Other Ambulatory Visit: Payer: Self-pay | Admitting: Family Medicine

## 2015-11-01 VITALS — BP 124/78 | HR 83 | Temp 97.8°F | Ht 68.7 in | Wt 284.0 lb

## 2015-11-01 DIAGNOSIS — N184 Chronic kidney disease, stage 4 (severe): Secondary | ICD-10-CM | POA: Diagnosis not present

## 2015-11-01 DIAGNOSIS — Z0000 Encounter for general adult medical examination without abnormal findings: Secondary | ICD-10-CM | POA: Diagnosis not present

## 2015-11-01 DIAGNOSIS — E1122 Type 2 diabetes mellitus with diabetic chronic kidney disease: Secondary | ICD-10-CM | POA: Diagnosis not present

## 2015-11-01 DIAGNOSIS — I10 Essential (primary) hypertension: Secondary | ICD-10-CM | POA: Diagnosis not present

## 2015-11-01 DIAGNOSIS — E785 Hyperlipidemia, unspecified: Secondary | ICD-10-CM

## 2015-11-01 LAB — LP+ALT+AST PICCOLO, WAIVED
ALT (SGPT) Piccolo, Waived: 33 U/L (ref 10–47)
AST (SGOT) Piccolo, Waived: 33 U/L (ref 11–38)
CHOL/HDL RATIO PICCOLO,WAIVE: 4 mg/dL
Cholesterol Piccolo, Waived: 152 mg/dL (ref <200)
HDL Chol Piccolo, Waived: 38 mg/dL — ABNORMAL LOW (ref >59)
LDL CHOL CALC PICCOLO WAIVED: 91 mg/dL (ref <100)
Triglycerides Piccolo,Waived: 115 mg/dL (ref <150)
VLDL CHOL CALC PICCOLO,WAIVE: 23 mg/dL (ref <30)

## 2015-11-01 LAB — BAYER DCA HB A1C WAIVED: HB A1C: 7 % — AB (ref <7.0)

## 2015-11-01 MED ORDER — DULAGLUTIDE 0.75 MG/0.5ML ~~LOC~~ SOAJ
0.7500 mg | SUBCUTANEOUS | Status: DC
Start: 2015-11-01 — End: 2015-11-16

## 2015-11-01 NOTE — Assessment & Plan Note (Addendum)
A1C up some today. Counseled on improving diet and exercise and adding on Trulicity.

## 2015-11-01 NOTE — Progress Notes (Signed)
   BP 124/78 mmHg  Pulse 83  Temp(Src) 97.8 F (36.6 C)  Ht 5' 8.7" (1.745 m)  Wt 284 lb (128.822 kg)  BMI 42.31 kg/m2  SpO2 97%   Subjective:    Patient ID: Katie Singh, female    DOB: Sep 15, 1950, 65 y.o.   MRN: [id number]  HPI: Katie Singh is a 65 y.o. female  Chief Complaint  Patient presents with  . Diabetes  . Hypertension  . Hyperlipidemia   Patient presents for routine follow up today.   DM - Diet controlled currently HTN - BPs doing well, no dizziness or syncopal episodes noted. Taking medications faithfully HLD - Diet controlled. Was on medication previously with no improvement.   Relevant past medical, surgical, family and social history reviewed and updated as indicated. Interim medical history since our last visit reviewed. Allergies and medications reviewed and updated.  Review of Systems  Per HPI unless specifically indicated above     Objective:    BP 124/78 mmHg  Pulse 83  Temp(Src) 97.8 F (36.6 C)  Ht 5' 8.7" (1.745 m)  Wt 284 lb (128.822 kg)  BMI 42.31 kg/m2  SpO2 97%  Wt Readings from Last 3 Encounters:  11/01/15 284 lb (128.822 kg)  08/08/15 285 lb (129.275 kg)  07/11/15 284 lb (128.822 kg)    Physical Exam  Results for orders placed or performed in visit on 10/21/15  HM DIABETES EYE EXAM  Result Value Ref Range   HM Diabetic Eye Exam No Retinopathy No Retinopathy      Assessment & Plan:   Problem List Items Addressed This Visit      Cardiovascular and Mediastinum   Hypertension    The current medical regimen is effective;  continue present plan and medications.       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 Counseled on improving diet and exercise and adding on Trulicity.       Relevant Medications   Dulaglutide (TRULICITY) [id number] SOPN   Other Relevant Orders   Bayer DCA Hb A1c Waived   LP+ALT+AST Piccolo, Waived   Basic metabolic panel     Other   Hyperlipidemia    LDL above goal, counseled patient on continued improvement of diet and exercise. Will continue to monitor for now as we are hoping for improvement with the addition of Trulicity and lifestyle modification.      Relevant Orders   Bayer DCA Hb A1c Waived   LP+ALT+AST Piccolo, Waived   Basic metabolic panel    Other Visit Diagnoses    Healthcare maintenance        Relevant Orders    Hepatitis C Antibody    HIV antibody        Follow up plan: Return in about 3 months (around 02/01/2016) for A1C, Lipid, AST, ALT.

## 2015-11-01 NOTE — Telephone Encounter (Signed)
Phone call discussed Will stop Trulicity due to cost and concerned about renal function will refer to lifestyles for diabetes education diet exercise nutrition

## 2015-11-01 NOTE — Telephone Encounter (Signed)
Call pt 

## 2015-11-01 NOTE — Assessment & Plan Note (Addendum)
LDL above goal, counseled patient on continued improvement of diet and exercise. Will continue to monitor for now as we are hoping for improvement with the addition of Trulicity and lifestyle modification.

## 2015-11-01 NOTE — Telephone Encounter (Signed)
[facility] in [place] called stated pt's insurance will not cover Trulicity and does not give an alternative. Something else will need to be called in or we need to call insurance company to get an exception. [facility] Exception telephone # [phone]. Please call Pharmacy and advise. Thanks.

## 2015-11-01 NOTE — Assessment & Plan Note (Signed)
The current medical regimen is effective;  continue present plan and medications.  

## 2015-11-01 NOTE — Telephone Encounter (Signed)
rx fax

## 2015-11-02 ENCOUNTER — Encounter: Payer: Self-pay | Admitting: Family Medicine

## 2015-11-02 LAB — HIV ANTIBODY (ROUTINE TESTING W REFLEX): HIV Screen 4th Generation wRfx: NONREACTIVE

## 2015-11-02 LAB — HEPATITIS C ANTIBODY: Hep C Virus Ab: <0.1 s/co ratio (ref 0.0–0.9)

## 2015-11-16 ENCOUNTER — Encounter: Payer: Self-pay | Admitting: Family Medicine

## 2015-11-16 ENCOUNTER — Ambulatory Visit (INDEPENDENT_AMBULATORY_CARE_PROVIDER_SITE_OTHER): Payer: PPO | Admitting: Family Medicine

## 2015-11-16 VITALS — BP 156/91 | HR 73 | Temp 98.6°F | Wt 288.0 lb

## 2015-11-16 DIAGNOSIS — Z01818 Encounter for other preprocedural examination: Secondary | ICD-10-CM

## 2015-11-16 NOTE — Patient Instructions (Signed)
Call with any questions or concerns. Best of luck with surgery next week!

## 2015-11-16 NOTE — Progress Notes (Signed)
BP 156/91 mmHg  Pulse 73  Temp(Src) 98.6 F (37 C)  Wt 288 lb (130.636 kg)  SpO2 97%   Subjective:    Patient ID: Katie Singh, female    DOB: 1950-11-28, 64 y.o.   MRN: [id number]  HPI: Katie Singh is a 65 y.o. female  Chief Complaint  Patient presents with  . Surgical Clearance    She is having Cataract Surgery on her left eye on 11/22/15.   Patient presents today for a pre-operative evaluation for left eye cataract surgery scheduled 11/22/15. She feels well overall and has no concerns today. Taking her medications faithfully and without any side effects noted.  HTN - Has not been checking regularly at home. Nephrologist had previously taken her off losartan but it was added back on recently given persistently elevated readings. Denies CP, dizziness, or syncopal episodes.   DM - A1c last month showed 7.0, working on lifestyle modification to further improve control.    CKD - Followed by nephrology, has not been in a while but sees [facility].   Relevant past medical, surgical, family and social history reviewed and updated as indicated. Interim medical history since our last visit reviewed. Allergies and medications reviewed and updated.  Review of Systems  Constitutional: Negative.   HENT: Negative.   Eyes: Visual disturbance: left eye blurry vision d/t cataract.  Respiratory: Negative.   Cardiovascular: Negative.   Gastrointestinal: Negative.   Endocrine: Negative.   Genitourinary: Negative.   Musculoskeletal: Back pain: mild, muscular.  Skin: Negative.   Neurological: Negative.   Hematological: Does not bruise/bleed easily.  Psychiatric/Behavioral: Negative.     Per HPI unless specifically indicated above     Objective:    BP 156/91 mmHg  Pulse 73  Temp(Src) 98.6 F (37 C)  Wt 288 lb (130.636 kg)  SpO2 97%  Wt Readings from Last 3 Encounters:  11/16/15 288 lb (130.636 kg)  11/01/15 284 lb (128.822 kg)  08/08/15 285 lb (129.275 kg)     Physical Exam  Constitutional: She is oriented to person, place, and time. She appears well-developed and well-nourished. No distress.  HENT:  Head: Atraumatic.  Eyes: Conjunctivae are normal. No scleral icterus.  Neck: Normal range of motion. Neck supple.  Cardiovascular: Normal rate and normal heart sounds.   Pulmonary/Chest: Effort normal and breath sounds normal. No respiratory distress.  Abdominal: Soft. Bowel sounds are normal.  Musculoskeletal: Normal range of motion.  Neurological: She is alert and oriented to person, place, and time.  Skin: Skin is warm and dry. No rash noted.  Psychiatric: She has a normal mood and affect. Her behavior is normal.  Nursing note and vitals reviewed.   EKG reviewed, stable from previous (04/2015) - Left axis, anterior fasicular block  Results for orders placed or performed in visit on 11/01/15  Bayer DCA Hb A1c Waived  Result Value Ref Range   Bayer DCA Hb A1c Waived 7.0 (H) <7.0 %  LP+ALT+AST Piccolo, Waived  Result Value Ref Range   ALT (SGPT) Piccolo, Waived 33 10 - 47 U/L   AST (SGOT) Piccolo, Waived 33 11 - 38 U/L   Cholesterol Piccolo, Waived 152 <200 mg/dL   HDL Chol Piccolo, Waived 38 (L) >59 mg/dL   Triglycerides Piccolo,Waived 115 <150 mg/dL   Chol/HDL Ratio Piccolo,Waive 4.0 mg/dL   LDL Chol Calc Piccolo Waived 91 <100 mg/dL   VLDL Chol Calc Piccolo,Waive 23 <30 mg/dL  Hepatitis C Antibody  Result Value Ref Range  Hep C Virus Ab <0.1 0.0 - 0.9 s/co ratio  HIV antibody  Result Value Ref Range   HIV Screen 4th Generation wRfx Non Reactive Non Reactive      Assessment & Plan:   Problem List Items Addressed This Visit    None    Visit Diagnoses    Pre-op evaluation    -  Primary    Labs and EKG completed today. Patient is medically cleared for her upcoming cataract surgery pending results.     Relevant Orders    CBC with Differential/Platelet    Comprehensive metabolic panel    UA/M w/rflx Culture, Routine    EKG  12-Lead (Completed)      Will have patient follow up in one month to address BP, still mildly elevated despite adding losartan back on.    Follow up plan: Return in about 4 weeks (around 12/14/2015) for BP.

## 2015-11-17 ENCOUNTER — Encounter: Payer: Self-pay | Admitting: Family Medicine

## 2015-11-17 ENCOUNTER — Other Ambulatory Visit: Payer: Self-pay | Admitting: Family Medicine

## 2015-11-17 LAB — COMPREHENSIVE METABOLIC PANEL
A/G RATIO: 1.2 (ref 1.2–2.2)
ALBUMIN: 4.1 g/dL (ref 3.6–4.8)
ALK PHOS: 131 IU/L — AB (ref 39–117)
ALT: 20 IU/L (ref 0–32)
AST: 18 IU/L (ref 0–40)
BUN/Creatinine Ratio: 13 (ref 12–28)
BUN: 23 mg/dL (ref 8–27)
Bilirubin Total: 0.3 mg/dL (ref 0.0–1.2)
CHLORIDE: 99 mmol/L (ref 96–106)
CO2: 22 mmol/L (ref 18–29)
Calcium: 9.2 mg/dL (ref 8.7–10.3)
Creatinine, Ser: 1.75 mg/dL — ABNORMAL HIGH (ref 0.57–1.00)
GFR calc Af Amer: 35 mL/min/{1.73_m2} — ABNORMAL LOW (ref >59)
GFR calc non Af Amer: 30 mL/min/{1.73_m2} — ABNORMAL LOW (ref >59)
GLOBULIN, TOTAL: 3.3 g/dL (ref 1.5–4.5)
Glucose: 94 mg/dL (ref 65–99)
Potassium: 4.1 mmol/L (ref 3.5–5.2)
Sodium: 140 mmol/L (ref 134–144)
Total Protein: 7.4 g/dL (ref 6.0–8.5)

## 2015-11-17 LAB — CBC WITH DIFFERENTIAL/PLATELET
BASOS: 1 %
Basophils Absolute: 0.1 10*3/uL (ref 0.0–0.2)
EOS (ABSOLUTE): 0.3 10*3/uL (ref 0.0–0.4)
EOS: 7 %
HEMATOCRIT: 39.5 % (ref 34.0–46.6)
HEMOGLOBIN: 12.4 g/dL (ref 11.1–15.9)
Immature Grans (Abs): 0 10*3/uL (ref 0.0–0.1)
Immature Granulocytes: 0 %
LYMPHS ABS: 1.4 10*3/uL (ref 0.7–3.1)
Lymphs: 28 %
MCH: 26 pg — ABNORMAL LOW (ref 26.6–33.0)
MCHC: 31.4 g/dL — AB (ref 31.5–35.7)
MCV: 83 fL (ref 79–97)
MONOCYTES: 7 %
MONOS ABS: 0.3 10*3/uL (ref 0.1–0.9)
NEUTROS ABS: 2.9 10*3/uL (ref 1.4–7.0)
Neutrophils: 57 %
Platelets: 271 10*3/uL (ref 150–379)
RBC: 4.77 x10E6/uL (ref 3.77–5.28)
RDW: 14.3 % (ref 12.3–15.4)
WBC: 5 10*3/uL (ref 3.4–10.8)

## 2015-11-17 LAB — URINE CULTURE, REFLEX

## 2015-11-17 LAB — UA/M W/RFLX CULTURE, ROUTINE
BILIRUBIN UA: NEGATIVE
Glucose, UA: NEGATIVE
KETONES UA: NEGATIVE
Nitrite, UA: NEGATIVE
PROTEIN UA: NEGATIVE
RBC UA: NEGATIVE
Specific Gravity, UA: 1.015 (ref 1.005–1.030)
UUROB: 0.2 mg/dL (ref 0.2–1.0)
pH, UA: 5 (ref 5.0–7.5)

## 2015-11-17 LAB — MICROSCOPIC EXAMINATION

## 2015-11-22 DIAGNOSIS — H2512 Age-related nuclear cataract, left eye: Secondary | ICD-10-CM | POA: Diagnosis not present

## 2015-11-28 ENCOUNTER — Ambulatory Visit: Payer: Self-pay | Admitting: Dietician

## 2015-12-12 DIAGNOSIS — H25011 Cortical age-related cataract, right eye: Secondary | ICD-10-CM | POA: Diagnosis not present

## 2015-12-14 ENCOUNTER — Encounter: Payer: Self-pay | Admitting: Family Medicine

## 2015-12-14 ENCOUNTER — Ambulatory Visit (INDEPENDENT_AMBULATORY_CARE_PROVIDER_SITE_OTHER): Payer: PPO | Admitting: Family Medicine

## 2015-12-14 VITALS — BP 143/86 | HR 81 | Temp 98.3°F | Wt 287.0 lb

## 2015-12-14 DIAGNOSIS — F329 Major depressive disorder, single episode, unspecified: Secondary | ICD-10-CM | POA: Diagnosis not present

## 2015-12-14 DIAGNOSIS — B351 Tinea unguium: Secondary | ICD-10-CM

## 2015-12-14 DIAGNOSIS — M2540 Effusion, unspecified joint: Secondary | ICD-10-CM

## 2015-12-14 DIAGNOSIS — I10 Essential (primary) hypertension: Secondary | ICD-10-CM

## 2015-12-14 DIAGNOSIS — M1990 Unspecified osteoarthritis, unspecified site: Secondary | ICD-10-CM | POA: Diagnosis not present

## 2015-12-14 DIAGNOSIS — N184 Chronic kidney disease, stage 4 (severe): Secondary | ICD-10-CM

## 2015-12-14 MED ORDER — LOSARTAN POTASSIUM 50 MG PO TABS: 50.0000 mg | ORAL_TABLET | Freq: Every day | ORAL | 3 refills | Status: DC

## 2015-12-14 MED ORDER — TERBINAFINE HCL 1 % EX CREA: 1.0000 "application " | TOPICAL_CREAM | Freq: Two times a day (BID) | CUTANEOUS | 0 refills | Status: DC

## 2015-12-14 MED ORDER — PREDNISONE 20 MG PO TABS: 40.0000 mg | ORAL_TABLET | Freq: Every day | ORAL | 0 refills | Status: DC

## 2015-12-14 MED ORDER — PAROXETINE HCL 40 MG PO TABS: 40.0000 mg | ORAL_TABLET | ORAL | 0 refills | Status: DC

## 2015-12-14 NOTE — Assessment & Plan Note (Addendum)
Improved, but still not at goal. Increase to 50 mg losartan, recheck in 1 month

## 2015-12-14 NOTE — Assessment & Plan Note (Signed)
Will check a uric acid to r/o gout of R 2nd digit DIP

## 2015-12-14 NOTE — Progress Notes (Signed)
BP (!) 143/86   Pulse 81   Temp 98.3 F (36.8 C)   Wt 287 lb (130.2 kg)   SpO2 96%   BMI 42.75 kg/m    Subjective:    Patient ID: Katie Singh, female    DOB: Mar 12, 1951, 65 y.o.   MRN: [id number]  HPI: KAMALI CARMOUCHE is a 65 y.o. female  Chief Complaint  Patient presents with  . Hypertension    4 week recheck  . Hand Pain    right hand, pointer finger x 3 days. Painful, red, swollen. No known injury.  . Back Pain    worse for a couple weeks, worse when she first gets up or if she walks long distances.   . Nail Problem    toenails are black. She is a diabetic.   Patient presents for 1 month BP recheck. Does not check at home, but denies CP, HA, dizziness, or syncope. Denies side effects with the medication. Currently on cardizem and 25 mg losartan which was recently added back on.   Also c/o joint pain and swelling, especially R 2nd digit at DIP joint. Has not seen rheumatology in a long time, but is on plaquenil for OA/possible RA. This is the worst her fingers have ever been. Also having lbp related to the arthritis.   Has noticed thickening an discoloration of toenails lately as well. Has not tried anything on them. Denies pain.  Pt has been on 30 mg paxil for many years now with good control over depressive symptoms. States the past month or so it doesn't seem to be working as well and would like to discuss increasing dose.   Relevant past medical, surgical, family and social history reviewed and updated as indicated. Interim medical history since our last visit reviewed. Allergies and medications reviewed and updated.  Past Medical History:  Diagnosis Date  . Anemia   . Chronic kidney disease   . Diabetes mellitus without complication ([place])   . GERD (gastroesophageal reflux disease)   . History of kidney stones   . Hyperlipidemia   . Hypertension   . Klebsiella infection   . Migraine headache with aura   . MSSA (methicillin susceptible Staphylococcus  aureus)   . Myelodysplasia   . OA (osteoarthritis)   . Positive anti-CCP test    followed by rheumatology   Social History   Social History  . Marital status: Divorced    Spouse name: N/A  . Number of children: N/A  . Years of education: N/A   Occupational History  . Not on file.   Social History Main Topics  . Smoking status: Never Smoker  . Smokeless tobacco: Never Used  . Alcohol use Yes     Comment: rare  . Drug use: No  . Sexual activity: Not on file   Other Topics Concern  . Not on file   Social History Narrative  . No narrative on file   Review of Systems  Constitutional: Negative.   HENT: Negative.   Respiratory: Negative.   Cardiovascular: Negative.   Gastrointestinal: Negative.   Musculoskeletal: Positive for arthralgias, back pain and joint swelling.  Skin:       Toenail thickening  Neurological: Negative.   Psychiatric/Behavioral: Negative.     Per HPI unless specifically indicated above     Objective:    BP (!) 143/86   Pulse 81   Temp 98.3 F (36.8 C)   Wt 287 lb (130.2 kg)   SpO2 96%  BMI 42.75 kg/m   Wt Readings from Last 3 Encounters:  12/14/15 287 lb (130.2 kg)  11/16/15 288 lb (130.6 kg)  11/01/15 284 lb (128.8 kg)    Physical Exam  Constitutional: She is oriented to person, place, and time. She appears well-developed and well-nourished. No distress.  HENT:  Head: Atraumatic.  Eyes: Conjunctivae are normal. No scleral icterus.  Neck: Normal range of motion. Neck supple.  Cardiovascular: Normal heart sounds.   Pulmonary/Chest: Effort normal.  Musculoskeletal: She exhibits tenderness (Right 2nd digit TTP) and deformity (b/l hands with deformities of all digits, especially R second digit DIP).  Decreased ROM in b/l hands due to stiffness and deformities  Neurological: She is alert and oriented to person, place, and time.  Skin: Skin is warm and dry. There is erythema (Right 2nd digit DIP).  Toenails hypertrophic and discolored   Psychiatric: She has a normal mood and affect. Her behavior is normal.  Nursing note and vitals reviewed.     Assessment & Plan:   Problem List Items Addressed This Visit      Cardiovascular and Mediastinum   Hypertension    Improved, but still not at goal. Increase to 50 mg losartan, recheck in 1 month      Relevant Medications   losartan (COZAAR) 50 MG tablet     Musculoskeletal and Integument   Osteoarthritis    Will check a uric acid to r/o gout of R 2nd digit DIP      Relevant Medications   predniSONE (DELTASONE) 20 MG tablet     Other   Major depression, chronic (HCC)    Increase paxil to 40 mg, recheck in 1 month.       Relevant Medications   PARoxetine (PAXIL) 40 MG tablet    Other Visit Diagnoses    Joint swelling    -  Primary   Will start prednisone burst for short term relief, recommended she see rheumatologist as soon as possible as she is past due and current flare is significant   Relevant Orders   Uric acid   CKD (chronic kidney disease), stage 4 (severe) ([place])       Will check CMP today, recommended she schedule regular follow up with nephrologist as she states she is past due.    Relevant Orders   Comprehensive metabolic panel   Onychomycosis       Lamisil cream given. Continue to monitor   Relevant Medications   terbinafine (LAMISIL AT) 1 % cream      Follow up plan: Return in about 4 weeks (around 01/11/2016) for HTN, Depression.

## 2015-12-14 NOTE — Assessment & Plan Note (Signed)
Increase paxil to 40 mg, recheck in 1 month.

## 2015-12-15 ENCOUNTER — Telehealth: Payer: Self-pay | Admitting: Family Medicine

## 2015-12-15 LAB — URIC ACID: Uric Acid: 7.5 mg/dL — ABNORMAL HIGH (ref 2.5–7.1)

## 2015-12-15 LAB — COMPREHENSIVE METABOLIC PANEL
A/G RATIO: 1.3 (ref 1.2–2.2)
ALK PHOS: 147 IU/L — AB (ref 39–117)
ALT: 20 IU/L (ref 0–32)
AST: 18 IU/L (ref 0–40)
Albumin: 4.2 g/dL (ref 3.6–4.8)
BILIRUBIN TOTAL: 0.5 mg/dL (ref 0.0–1.2)
BUN/Creatinine Ratio: 15 (ref 12–28)
BUN: 24 mg/dL (ref 8–27)
CHLORIDE: 100 mmol/L (ref 96–106)
CO2: 21 mmol/L (ref 18–29)
Calcium: 8.7 mg/dL (ref 8.7–10.3)
Creatinine, Ser: 1.64 mg/dL — ABNORMAL HIGH (ref 0.57–1.00)
GFR calc Af Amer: 38 mL/min/{1.73_m2} — ABNORMAL LOW (ref >59)
GFR calc non Af Amer: 33 mL/min/{1.73_m2} — ABNORMAL LOW (ref >59)
Globulin, Total: 3.3 g/dL (ref 1.5–4.5)
Glucose: 152 mg/dL — ABNORMAL HIGH (ref 65–99)
POTASSIUM: 4.1 mmol/L (ref 3.5–5.2)
Sodium: 138 mmol/L (ref 134–144)
Total Protein: 7.5 g/dL (ref 6.0–8.5)

## 2015-12-15 MED ORDER — COLCHICINE 0.6 MG PO TABS: 0.6000 mg | ORAL_TABLET | Freq: Every day | ORAL | 0 refills | Status: DC

## 2015-12-15 NOTE — Telephone Encounter (Signed)
Please call patient and let her know that she did test positive for a gout flare which is likely why her pointer finger is so painful right now. The prednisone should do the trick for her, but I have sent in two tablets of colchicine if she really needs it - to be taken 12 hours apart. Adivse her not to take it unless absolutely necessary since her renal function is poor. Will not put her on chronic prophylaxis unless another flare occurs. Counsel her on limiting/cutting out alcohol especially beer, and limiting seafood, red meats, and sugar drinks. We will recheck her levels at her 1 month f/u. Thanks!

## 2015-12-15 NOTE — Telephone Encounter (Signed)
Left message to call.

## 2015-12-16 DIAGNOSIS — M7989 Other specified soft tissue disorders: Secondary | ICD-10-CM | POA: Diagnosis not present

## 2015-12-16 DIAGNOSIS — M79641 Pain in right hand: Secondary | ICD-10-CM | POA: Diagnosis not present

## 2015-12-16 NOTE — Telephone Encounter (Signed)
Patient returned call. Patient notified of results, medication and what foods to limit.

## 2015-12-19 DIAGNOSIS — M7989 Other specified soft tissue disorders: Secondary | ICD-10-CM | POA: Diagnosis not present

## 2015-12-22 DIAGNOSIS — L089 Local infection of the skin and subcutaneous tissue, unspecified: Secondary | ICD-10-CM | POA: Diagnosis not present

## 2015-12-22 DIAGNOSIS — M79645 Pain in left finger(s): Secondary | ICD-10-CM | POA: Diagnosis not present

## 2015-12-27 ENCOUNTER — Ambulatory Visit (INDEPENDENT_AMBULATORY_CARE_PROVIDER_SITE_OTHER): Payer: PPO | Admitting: Family Medicine

## 2015-12-27 ENCOUNTER — Encounter: Payer: Self-pay | Admitting: Family Medicine

## 2015-12-27 VITALS — BP 128/84 | HR 84 | Temp 98.0°F | Resp 16 | Ht 69.0 in | Wt 284.0 lb

## 2015-12-27 DIAGNOSIS — L03011 Cellulitis of right finger: Secondary | ICD-10-CM

## 2015-12-27 NOTE — Progress Notes (Signed)
BP 128/84   Pulse 84   Temp 98 F (36.7 C)   Resp 16   Ht 5\' 9"  (1.753 m)   Wt 284 lb (128.8 kg)   BMI 41.94 kg/m    Subjective:    Patient ID: Katie Singh, female    DOB: [birth year], 65 y.o.   MRN: [id number]  HPI: Katie Singh is a 65 y.o. female   Finger abscess Patient for the last 2 weeks has had marked redness inflammation of second right DIP extending into the fingernail area with development of marked paronychia and a great deal of pain with throbbing. Patient is been sleepless area looks about ready to rupture Has appointment with orthopedics tomorrow  Relevant past medical, surgical, family and social history reviewed and updated as indicated. Interim medical history since our last visit reviewed. Allergies and medications reviewed and updated.  Review of Systems  Per HPI unless specifically indicated above     Objective:    BP 128/84   Pulse 84   Temp 98 F (36.7 C)   Resp 16   Ht 5\' 9"  (1.753 m)   Wt 284 lb (128.8 kg)   BMI 41.94 kg/m   Wt Readings from Last 3 Encounters:  12/27/15 284 lb (128.8 kg)  12/14/15 287 lb (130.2 kg)  11/16/15 288 lb (130.6 kg)    Physical Exam  Constitutional: She is oriented to person, place, and time. She appears well-developed and well-nourished. No distress.  HENT:  Head: Normocephalic and atraumatic.  Right Ear: Hearing normal.  Left Ear: Hearing normal.  Nose: Nose normal.  Eyes: Conjunctivae and lids are normal. Right eye exhibits no discharge. Left eye exhibits no discharge. No scleral icterus.  Pulmonary/Chest: Effort normal. No respiratory distress.  Musculoskeletal: Normal range of motion.  Neurological: She is alert and oriented to person, place, and time.  Skin: Skin is intact. No rash noted.  Paronychia prepped with Betadine and alcohol sprayed with cryo-freeze 18-gauge needle slipped under fingernail with bloody exudate expressed with no resolution of large paronychia. Further areas were probed  with no resolution just bloody exudate. Further small incision was made above her fingernail with no resolution.  Psychiatric: She has a normal mood and affect. Her speech is normal and behavior is normal. Judgment and thought content normal. Cognition and memory are normal.    Results for orders placed or performed in visit on 12/14/15  Comprehensive metabolic panel  Result Value Ref Range   Glucose 152 (H) 65 - 99 mg/dL   BUN 24 8 - 27 mg/dL   Creatinine, Ser 1.64 (H) 0.57 - 1.00 mg/dL   GFR calc non Af Amer 33 (L) >59 mL/min/1.73   GFR calc Af Amer 38 (L) >59 mL/min/1.73   BUN/Creatinine Ratio 15 12 - 28   Sodium 138 134 - 144 mmol/L   Potassium 4.1 3.5 - 5.2 mmol/L   Chloride 100 96 - 106 mmol/L   CO2 21 18 - 29 mmol/L   Calcium 8.7 8.7 - 10.3 mg/dL   Total Protein 7.5 6.0 - 8.5 g/dL   Albumin 4.2 3.6 - 4.8 g/dL   Globulin, Total 3.3 1.5 - 4.5 g/dL   Albumin/Globulin Ratio 1.3 1.2 - 2.2   Bilirubin Total 0.5 0.0 - 1.2 mg/dL   Alkaline Phosphatase 147 (H) 39 - 117 IU/L   AST 18 0 - 40 IU/L   ALT 20 0 - 32 IU/L  Uric acid  Result Value Ref Range   Uric  Acid 7.5 (H) 2.5 - 7.1 mg/dL      Assessment & Plan:   Problem List Items Addressed This Visit    None    Visit Diagnoses    Paronychia of finger, right    -  Primary   May be possibly infected ganglion cyst patient education on hot compresses elevation continue doxycycline keep appointment with orthopedics tomorrow.       Follow up plan: Return if symptoms worsen or fail to improve, for As scheduled.

## 2015-12-28 ENCOUNTER — Ambulatory Visit: Payer: PPO | Admitting: Family Medicine

## 2015-12-28 DIAGNOSIS — L089 Local infection of the skin and subcutaneous tissue, unspecified: Secondary | ICD-10-CM | POA: Diagnosis not present

## 2016-01-09 ENCOUNTER — Other Ambulatory Visit: Payer: Self-pay

## 2016-01-09 MED ORDER — PAROXETINE HCL 40 MG PO TABS: 40.0000 mg | ORAL_TABLET | ORAL | 0 refills | Status: DC

## 2016-01-09 NOTE — Telephone Encounter (Signed)
Routing to provider. Dr.Crissman patient. She needs a refill on Paroxetine.

## 2016-01-10 ENCOUNTER — Encounter
Admission: RE | Admit: 2016-01-10 | Discharge: 2016-01-10 | Disposition: A | Discharge status: Home / Self Care | Payer: PPO | Source: Ambulatory Visit | Attending: Orthopedic Surgery | Admitting: Orthopedic Surgery

## 2016-01-10 DIAGNOSIS — Z01812 Encounter for preprocedural laboratory examination: Secondary | ICD-10-CM | POA: Insufficient documentation

## 2016-01-10 DIAGNOSIS — M19049 Primary osteoarthritis, unspecified hand: Secondary | ICD-10-CM | POA: Insufficient documentation

## 2016-01-10 HISTORY — DX: Inflammatory liver disease, unspecified: K75.9

## 2016-01-10 HISTORY — DX: Rheumatic fever without heart involvement: I00

## 2016-01-10 HISTORY — DX: Other complications of anesthesia, initial encounter: T88.59XA

## 2016-01-10 HISTORY — DX: Other specified postprocedural states: Z98.890

## 2016-01-10 HISTORY — DX: Adverse effect of unspecified anesthetic, initial encounter: T41.45XA

## 2016-01-10 HISTORY — DX: Nausea with vomiting, unspecified: R11.2

## 2016-01-10 LAB — CBC
HEMATOCRIT: 35.9 % (ref 35.0–47.0)
HEMOGLOBIN: 12.5 g/dL (ref 12.0–16.0)
MCH: 28.1 pg (ref 26.0–34.0)
MCHC: 34.9 g/dL (ref 32.0–36.0)
MCV: 80.6 fL (ref 80.0–100.0)
Platelets: 201 10*3/uL (ref 150–440)
RBC: 4.45 MIL/uL (ref 3.80–5.20)
RDW: 13.9 % (ref 11.5–14.5)
WBC: 4.1 10*3/uL (ref 3.6–11.0)

## 2016-01-10 LAB — SURGICAL PCR SCREEN
MRSA, PCR: NEGATIVE
Staphylococcus aureus: NEGATIVE

## 2016-01-10 NOTE — Patient Instructions (Signed)
  Your procedure is scheduled [id number] Sept. 7 , 2017. Report to Same Day Surgery. To find out your arrival time please call [phone] between 1PM - 3PM on Wednesday Sept. 6, 2017.  Remember: Instructions that are not followed completely may result in serious medical risk, up to and including death, or upon the discretion of your surgeon and anesthesiologist your surgery may need to be rescheduled.    _x___ 1. Do not eat food or drink liquids after midnight. No gum chewing or hard candies.     ____ 2. No Alcohol for 24 hours before or after surgery.   ____ 3. Bring all medications with you on the day of surgery if instructed.    __x__ 4. Notify your doctor if there is any change in your medical condition     (cold, fever, infections).     Do not wear jewelry, make-up, hairpins, clips or nail polish.  Do not wear lotions, powders, or perfumes. You may wear deodorant.  Do not shave 48 hours prior to surgery. Men may shave face and neck.  Do not bring valuables to the hospital.    [facility] is not responsible for any belongings or valuables.               Contacts, dentures or bridgework may not be worn into surgery.  Leave your suitcase in the car. After surgery it may be brought to your room.  For patients admitted to the hospital, discharge time is determined by your treatment team.   Patients discharged the day of surgery will not be allowed to drive home.    Please read over the following fact sheets that you were given:   [facility] Preparing for Surgery  _x___ Take these medicines the morning of surgery with A SIP OF WATER:    1. CARDIZEM CD  2. gabapentin (NEURONTIN)  3. losartan (COZAAR)  4. PARoxetine (PAXIL)  5. traMADol (ULTRAM) optional    ____ Fleet Enema (as directed)   __x__ Use CHG Soap as directed on instruction sheet  ____ Use inhalers on the day of surgery and bring to hospital day of surgery  ____ Stop metformin 2 days prior to  surgery    ____ Take 1/2 of usual insulin dose the night before surgery and none on the morning of  surgery.   __x__ Stop aspirin on January 12, 2016.  __x__ Stop Anti-inflammatories such as: MOBIC, Advil, Aleve, Ibuprofen, Motrin, Naproxen, Naprosyn, Goodies powders or aspirin products.   ____ Stop supplements until after surgery.    ____ Bring C-Pap to the hospital.

## 2016-01-11 ENCOUNTER — Encounter: Payer: Self-pay | Admitting: Family Medicine

## 2016-01-11 ENCOUNTER — Ambulatory Visit (INDEPENDENT_AMBULATORY_CARE_PROVIDER_SITE_OTHER): Payer: PPO | Admitting: Family Medicine

## 2016-01-11 VITALS — BP 149/83 | HR 67 | Temp 97.7°F | Wt 290.0 lb

## 2016-01-11 DIAGNOSIS — I10 Essential (primary) hypertension: Secondary | ICD-10-CM | POA: Diagnosis not present

## 2016-01-11 DIAGNOSIS — F329 Major depressive disorder, single episode, unspecified: Secondary | ICD-10-CM | POA: Diagnosis not present

## 2016-01-11 MED ORDER — PAROXETINE HCL 40 MG PO TABS: 40.0000 mg | ORAL_TABLET | ORAL | 4 refills | Status: DC

## 2016-01-11 MED ORDER — LOSARTAN POTASSIUM 100 MG PO TABS: 100.0000 mg | ORAL_TABLET | Freq: Every day | ORAL | 1 refills | Status: DC

## 2016-01-11 NOTE — Patient Instructions (Signed)
Follow up in one month, sooner if needed

## 2016-01-11 NOTE — Assessment & Plan Note (Signed)
Not at goal, will increase to 100 mg losartan and recheck in 1 month. She will start checking home BPs more frequently as well.

## 2016-01-11 NOTE — Assessment & Plan Note (Signed)
Well controlled with 40 mg paxil. Continue current regimen.

## 2016-01-11 NOTE — Progress Notes (Signed)
BP (!) 149/83   Pulse 67   Temp 97.7 F (36.5 C)   Wt 290 lb (131.5 kg)   SpO2 97%   BMI 44.09 kg/m    Subjective:    Patient ID: Katie Singh, female    DOB: 1951-05-12, 65 y.o.   MRN: [id number]  HPI: Katie Singh is a 65 y.o. female  Chief Complaint  Patient presents with  . Hypertension    follow up   1 month f/u HTN and depression. Home BPs have been 150s-160s/90s the few times she has taken it. No CP, SOB, HAs. Doing well on medications, taking them faithfully without side effects.   Doing really well on increased Paxil dose. States her moods have greatly improved despite all of the medical issues currently - having orthopedic procedure done tomorrow for ongoing pointer finger issue among other things.   Relevant past medical, surgical, family and social history reviewed and updated as indicated. Interim medical history since our last visit reviewed. Allergies and medications reviewed and updated.  Review of Systems  Constitutional: Negative.   HENT: Negative.   Respiratory: Negative.   Cardiovascular: Negative.   Gastrointestinal: Negative.   Musculoskeletal: Positive for arthralgias and joint swelling.  Neurological: Negative.   Psychiatric/Behavioral: Negative.     Per HPI unless specifically indicated above     Objective:    BP (!) 149/83   Pulse 67   Temp 97.7 F (36.5 C)   Wt 290 lb (131.5 kg)   SpO2 97%   BMI 44.09 kg/m   Wt Readings from Last 3 Encounters:  01/11/16 290 lb (131.5 kg)  01/10/16 283 lb (128.4 kg)  12/27/15 284 lb (128.8 kg)    Physical Exam  Constitutional: She is oriented to person, place, and time. She appears well-developed and well-nourished. No distress.  HENT:  Head: Atraumatic.  Eyes: Conjunctivae are normal. No scleral icterus.  Neck: Normal range of motion. Neck supple.  Cardiovascular: Normal rate and normal heart sounds.   Pulmonary/Chest: Effort normal and breath sounds normal. No respiratory distress.    Musculoskeletal: She exhibits edema and tenderness.  Multiple swollen joints in b/l hands, severe erythema and edema in right second digit DIP  Neurological: She is alert and oriented to person, place, and time.  Skin: Skin is warm and dry.  Psychiatric: She has a normal mood and affect. Her behavior is normal. Judgment and thought content normal.  Nursing note and vitals reviewed.   Results for orders placed or performed during the hospital encounter of 01/10/16  Surgical pcr screen  Result Value Ref Range   MRSA, PCR NEGATIVE NEGATIVE   Staphylococcus aureus NEGATIVE NEGATIVE  CBC  Result Value Ref Range   WBC 4.1 3.6 - 11.0 K/uL   RBC 4.45 3.80 - 5.20 MIL/uL   Hemoglobin 12.5 12.0 - 16.0 g/dL   HCT 35.9 35.0 - 47.0 %   MCV 80.6 80.0 - 100.0 fL   MCH 28.1 26.0 - 34.0 pg   MCHC 34.9 32.0 - 36.0 g/dL   RDW 13.9 11.5 - 14.5 %   Platelets 201 150 - 440 K/uL      Assessment & Plan:   Problem List Items Addressed This Visit      Cardiovascular and Mediastinum   Hypertension - Primary    Not at goal, will increase to 100 mg losartan and recheck in 1 month. She will start checking home BPs more frequently as well.      Relevant Medications  losartan (COZAAR) 100 MG tablet     Other   Major depression, chronic (HCC)    Well controlled with 40 mg paxil. Continue current regimen.      Relevant Medications   PARoxetine (PAXIL) 40 MG tablet    Other Visit Diagnoses   None.     Had to cancel nephrology appt due to finger issues, will work on getting back with Rheumatology and Nephrology once finger has resolved over the next few weeks.  Follow up plan: Return in about 4 weeks (around 02/08/2016) for BP recheck.

## 2016-01-18 MED ORDER — CLINDAMYCIN PHOSPHATE 900 MG/50ML IV SOLN
900.0000 mg | Freq: Once | INTRAVENOUS | Status: AC
Start: 2016-01-19 — End: 2016-01-19
  Administered 2016-01-19 (×2): 900 mg via INTRAVENOUS

## 2016-01-19 ENCOUNTER — Ambulatory Visit
Admission: RE | Admit: 2016-01-19 | Discharge: 2016-01-19 | Disposition: A | Discharge status: Home / Self Care | Payer: PPO | Source: Ambulatory Visit | Attending: Orthopedic Surgery | Admitting: Orthopedic Surgery

## 2016-01-19 ENCOUNTER — Ambulatory Visit: Payer: PPO | Admitting: Anesthesiology

## 2016-01-19 ENCOUNTER — Encounter
Admission: RE | Disposition: A | Discharge status: Home / Self Care | Payer: Self-pay | Source: Ambulatory Visit | Attending: Orthopedic Surgery

## 2016-01-19 DIAGNOSIS — Z809 Family history of malignant neoplasm, unspecified: Secondary | ICD-10-CM | POA: Insufficient documentation

## 2016-01-19 DIAGNOSIS — M064 Inflammatory polyarthropathy: Secondary | ICD-10-CM | POA: Insufficient documentation

## 2016-01-19 DIAGNOSIS — M67841 Other specified disorders of synovium, right hand: Secondary | ICD-10-CM | POA: Diagnosis not present

## 2016-01-19 DIAGNOSIS — G4730 Sleep apnea, unspecified: Secondary | ICD-10-CM | POA: Insufficient documentation

## 2016-01-19 DIAGNOSIS — F329 Major depressive disorder, single episode, unspecified: Secondary | ICD-10-CM | POA: Insufficient documentation

## 2016-01-19 DIAGNOSIS — Z882 Allergy status to sulfonamides status: Secondary | ICD-10-CM | POA: Diagnosis not present

## 2016-01-19 DIAGNOSIS — Z888 Allergy status to other drugs, medicaments and biological substances status: Secondary | ICD-10-CM | POA: Insufficient documentation

## 2016-01-19 DIAGNOSIS — Z880 Allergy status to penicillin: Secondary | ICD-10-CM | POA: Diagnosis not present

## 2016-01-19 DIAGNOSIS — Z841 Family history of disorders of kidney and ureter: Secondary | ICD-10-CM | POA: Diagnosis not present

## 2016-01-19 DIAGNOSIS — E1122 Type 2 diabetes mellitus with diabetic chronic kidney disease: Secondary | ICD-10-CM | POA: Diagnosis not present

## 2016-01-19 DIAGNOSIS — E785 Hyperlipidemia, unspecified: Secondary | ICD-10-CM | POA: Diagnosis not present

## 2016-01-19 DIAGNOSIS — I129 Hypertensive chronic kidney disease with stage 1 through stage 4 chronic kidney disease, or unspecified chronic kidney disease: Secondary | ICD-10-CM | POA: Diagnosis not present

## 2016-01-19 DIAGNOSIS — Z8379 Family history of other diseases of the digestive system: Secondary | ICD-10-CM | POA: Diagnosis not present

## 2016-01-19 DIAGNOSIS — Z833 Family history of diabetes mellitus: Secondary | ICD-10-CM | POA: Diagnosis not present

## 2016-01-19 DIAGNOSIS — Z7982 Long term (current) use of aspirin: Secondary | ICD-10-CM | POA: Insufficient documentation

## 2016-01-19 DIAGNOSIS — K219 Gastro-esophageal reflux disease without esophagitis: Secondary | ICD-10-CM | POA: Diagnosis not present

## 2016-01-19 DIAGNOSIS — I10 Essential (primary) hypertension: Secondary | ICD-10-CM | POA: Diagnosis not present

## 2016-01-19 DIAGNOSIS — Z8249 Family history of ischemic heart disease and other diseases of the circulatory system: Secondary | ICD-10-CM | POA: Diagnosis not present

## 2016-01-19 DIAGNOSIS — N183 Chronic kidney disease, stage 3 (moderate): Secondary | ICD-10-CM | POA: Insufficient documentation

## 2016-01-19 DIAGNOSIS — Z881 Allergy status to other antibiotic agents status: Secondary | ICD-10-CM | POA: Insufficient documentation

## 2016-01-19 DIAGNOSIS — Z87442 Personal history of urinary calculi: Secondary | ICD-10-CM | POA: Diagnosis not present

## 2016-01-19 DIAGNOSIS — Z79899 Other long term (current) drug therapy: Secondary | ICD-10-CM | POA: Insufficient documentation

## 2016-01-19 DIAGNOSIS — D649 Anemia, unspecified: Secondary | ICD-10-CM | POA: Diagnosis not present

## 2016-01-19 DIAGNOSIS — L089 Local infection of the skin and subcutaneous tissue, unspecified: Secondary | ICD-10-CM | POA: Diagnosis not present

## 2016-01-19 DIAGNOSIS — E119 Type 2 diabetes mellitus without complications: Secondary | ICD-10-CM | POA: Diagnosis not present

## 2016-01-19 HISTORY — PX: DISTAL INTERPHALANGEAL JOINT FUSION: SHX6428

## 2016-01-19 LAB — GLUCOSE, CAPILLARY
GLUCOSE-CAPILLARY: 166 mg/dL — AB (ref 65–99)
Glucose-Capillary: 132 mg/dL — ABNORMAL HIGH (ref 65–99)

## 2016-01-19 SURGERY — DISTAL INTERPHALANGEAL JOINT FUSION
Anesthesia: General | Site: Finger | Laterality: Right | Wound class: Clean

## 2016-01-19 MED ORDER — LACTATED RINGERS IV SOLN
INTRAVENOUS | Status: DC | PRN
  Administered 2016-01-19 (×2): via INTRAVENOUS

## 2016-01-19 MED ORDER — ONDANSETRON HCL 4 MG/2ML IJ SOLN
INTRAMUSCULAR | Status: DC | PRN
  Administered 2016-01-19: 4 mg via INTRAVENOUS

## 2016-01-19 MED ORDER — MIDAZOLAM HCL 2 MG/2ML IJ SOLN
INTRAMUSCULAR | Status: DC | PRN
  Administered 2016-01-19: 2 mg via INTRAVENOUS

## 2016-01-19 MED ORDER — NEOMYCIN-POLYMYXIN B GU 40-200000 IR SOLN
Status: DC | PRN
  Administered 2016-01-19: 2 mL

## 2016-01-19 MED ORDER — SODIUM CHLORIDE 0.9 % IV SOLN
INTRAVENOUS | Status: DC
  Administered 2016-01-19: 06:00:00 via INTRAVENOUS

## 2016-01-19 MED ORDER — HYDROCODONE-ACETAMINOPHEN 5-325 MG PO TABS: 1.0000 | ORAL_TABLET | Freq: Four times a day (QID) | ORAL | 0 refills | Status: DC | PRN

## 2016-01-19 MED ORDER — FAMOTIDINE 20 MG PO TABS
ORAL_TABLET | ORAL | Status: AC
  Administered 2016-01-19: 20 mg via ORAL
  Filled 2016-01-19: qty 1

## 2016-01-19 MED ORDER — BUPIVACAINE HCL (PF) 0.5 % IJ SOLN
INTRAMUSCULAR | Status: AC
  Filled 2016-01-19: qty 30

## 2016-01-19 MED ORDER — DEXAMETHASONE SODIUM PHOSPHATE 10 MG/ML IJ SOLN
INTRAMUSCULAR | Status: DC | PRN
  Administered 2016-01-19: 5 mg via INTRAVENOUS

## 2016-01-19 MED ORDER — PROPOFOL 10 MG/ML IV BOLUS
INTRAVENOUS | Status: DC | PRN
  Administered 2016-01-19: 200 mg via INTRAVENOUS

## 2016-01-19 MED ORDER — BUPIVACAINE HCL (PF) 0.5 % IJ SOLN
INTRAMUSCULAR | Status: DC | PRN
  Administered 2016-01-19: 10 mL

## 2016-01-19 MED ORDER — CLINDAMYCIN PHOSPHATE 900 MG/50ML IV SOLN
INTRAVENOUS | Status: AC
  Administered 2016-01-19: 900 mg via INTRAVENOUS
  Filled 2016-01-19: qty 50

## 2016-01-19 MED ORDER — NEOMYCIN-POLYMYXIN B GU 40-200000 IR SOLN
Status: AC
  Filled 2016-01-19: qty 2

## 2016-01-19 MED ORDER — FENTANYL CITRATE (PF) 100 MCG/2ML IJ SOLN
INTRAMUSCULAR | Status: DC | PRN
  Administered 2016-01-19 (×2): 100 ug via INTRAVENOUS

## 2016-01-19 MED ORDER — FENTANYL CITRATE (PF) 100 MCG/2ML IJ SOLN: 25.0000 ug | INTRAMUSCULAR | Status: DC | PRN

## 2016-01-19 MED ORDER — LIDOCAINE HCL (CARDIAC) 20 MG/ML IV SOLN
INTRAVENOUS | Status: DC | PRN
  Administered 2016-01-19: 30 mg via INTRAVENOUS

## 2016-01-19 MED ORDER — FAMOTIDINE 20 MG PO TABS
20.0000 mg | ORAL_TABLET | Freq: Once | ORAL | Status: AC
  Administered 2016-01-19: 20 mg via ORAL

## 2016-01-19 MED ORDER — PROMETHAZINE HCL 50 MG PO TABS: 50.0000 mg | ORAL_TABLET | Freq: Four times a day (QID) | ORAL | 0 refills | Status: DC | PRN

## 2016-01-19 MED ORDER — ONDANSETRON HCL 4 MG/2ML IJ SOLN: 4.0000 mg | Freq: Once | INTRAMUSCULAR | Status: DC | PRN

## 2016-01-19 SURGICAL SUPPLY — 37 items
BANDAGE ELASTIC 3 LF NS (GAUZE/BANDAGES/DRESSINGS) ×2 IMPLANT
BANDAGE ELASTIC 4 LF NS (GAUZE/BANDAGES/DRESSINGS) ×2 IMPLANT
BIT DRILL 24 ACUTRAK FUSION (BIT) ×2 IMPLANT
BNDG COHESIVE 1X5 TAN NS LF (GAUZE/BANDAGES/DRESSINGS) ×2 IMPLANT
BNDG COHESIVE 4X5 TAN STRL (GAUZE/BANDAGES/DRESSINGS) ×2 IMPLANT
BNDG ESMARK 4X12 TAN STRL LF (GAUZE/BANDAGES/DRESSINGS) ×2 IMPLANT
BNDG GAUZE 1X2.1 STRL (MISCELLANEOUS) ×2 IMPLANT
CHLORAPREP W/TINT 26ML (MISCELLANEOUS) ×2 IMPLANT
CUFF TOURN 18 STER (MISCELLANEOUS) IMPLANT
DRAPE FLUOR MINI C-ARM 54X84 (DRAPES) ×2 IMPLANT
ELECT CAUTERY BLADE 6.4 (BLADE) ×2 IMPLANT
ELECT REM PT RETURN 9FT ADLT (ELECTROSURGICAL) ×2
ELECTRODE REM PT RTRN 9FT ADLT (ELECTROSURGICAL) ×1 IMPLANT
GAUZE SPONGE 4X4 12PLY STRL (GAUZE/BANDAGES/DRESSINGS) ×2 IMPLANT
GLOVE BIOGEL PI IND STRL 9 (GLOVE) ×1 IMPLANT
GLOVE BIOGEL PI INDICATOR 9 (GLOVE) ×1
GLOVE SURG ORTHO 9.0 STRL STRW (GLOVE) ×2 IMPLANT
GOWN SRG 2XL LVL 4 RGLN SLV (GOWNS) ×1 IMPLANT
GOWN STRL NON-REIN 2XL LVL4 (GOWNS) ×1
GOWN STRL REUS W/ TWL LRG LVL3 (GOWN DISPOSABLE) ×1 IMPLANT
GOWN STRL REUS W/TWL LRG LVL3 (GOWN DISPOSABLE) ×1
GUIDEWIRE ORTHO 062 (WIRE) ×2 IMPLANT
IV CATH ANGIO 14GX3.25 ORG (MISCELLANEOUS) IMPLANT
KIT RM TURNOVER STRD PROC AR (KITS) ×2 IMPLANT
NEEDLE FILTER BLUNT 18X 1/2SAF (NEEDLE) ×1
NEEDLE FILTER BLUNT 18X1 1/2 (NEEDLE) ×1 IMPLANT
NEEDLE HYPO 25X1 1.5 SAFETY (NEEDLE) ×2 IMPLANT
NS IRRIG 500ML POUR BTL (IV SOLUTION) ×2 IMPLANT
PACK EXTREMITY ARMC (MISCELLANEOUS) ×2 IMPLANT
PAD PREP 24X41 OB/GYN DISP (PERSONAL CARE ITEMS) ×2 IMPLANT
PADDING CAST BLEND 4X4 NS (MISCELLANEOUS) ×2 IMPLANT
SCREW FUSION ACUTRAK 30 3000 (Screw) ×1 IMPLANT
SCREW FUSION ACUTRAK 30MM 3000 (Screw) ×2 IMPLANT
STOCKINETTE STRL 4IN 9604848 (GAUZE/BANDAGES/DRESSINGS) ×2 IMPLANT
SUT ETHIBOND 4-0 (SUTURE) ×2 IMPLANT
SUT ETHILON 5 0 CL P 3 (SUTURE) ×2 IMPLANT
SYRINGE 10CC LL (SYRINGE) ×2 IMPLANT

## 2016-01-19 NOTE — Anesthesia Procedure Notes (Signed)
Procedure Name: LMA Insertion Performed by: Sinda Du Pre-anesthesia Checklist: Patient identified, Patient being monitored, Timeout performed, Emergency Drugs available and Suction available Patient Re-evaluated:Patient Re-evaluated prior to inductionOxygen Delivery Method: Circle system utilized Preoxygenation: Pre-oxygenation with 100% oxygen Intubation Type: IV induction Ventilation: Mask ventilation without difficulty LMA: LMA inserted Tube type: Oral Number of attempts: 1 Placement Confirmation: positive ETCO2 and breath sounds checked- equal and bilateral Tube secured with: Tape Dental Injury: Teeth and Oropharynx as per pre-operative assessment

## 2016-01-19 NOTE — Anesthesia Preprocedure Evaluation (Signed)
Anesthesia Evaluation  Patient identified by MRN, date of birth, ID band Patient awake    Reviewed: Allergy & Precautions, H&P , NPO status , Patient's Chart, lab work & pertinent test results, reviewed documented beta blocker date and time   History of Anesthesia Complications (+) PONV and history of anesthetic complications  Airway Mallampati: II  TM Distance: >3 FB Neck ROM: full    Dental  (+) Teeth Intact, Poor Dentition   Pulmonary neg pulmonary ROS, sleep apnea ,    Pulmonary exam normal        Cardiovascular Exercise Tolerance: Good hypertension, negative cardio ROS Normal cardiovascular exam Rhythm:regular Rate:Normal     Neuro/Psych  Headaches, PSYCHIATRIC DISORDERS negative neurological ROS  negative psych ROS   GI/Hepatic negative GI ROS, Neg liver ROS, (+) Hepatitis -  Endo/Other  negative endocrine ROSdiabetes  Renal/GU Renal diseasenegative Renal ROS  negative genitourinary   Musculoskeletal   Abdominal   Peds  Hematology negative hematology ROS (+)   Anesthesia Other Findings   Reproductive/Obstetrics negative OB ROS                             Anesthesia Physical Anesthesia Plan  ASA: III  Anesthesia Plan: General LMA   Post-op Pain Management:    Induction:   Airway Management Planned: LMA  Additional Equipment:   Intra-op Plan:   Post-operative Plan:   Informed Consent: I have reviewed the patients History and Physical, chart, labs and discussed the procedure including the risks, benefits and alternatives for the proposed anesthesia with the patient or authorized representative who has indicated his/her understanding and acceptance.   Dental Advisory Given  Plan Discussed with: CRNA  Anesthesia Plan Comments: (Spoke with patient regarding options and based on discussion, pt prefers GA for procedure.  JA)        Anesthesia Quick Evaluation

## 2016-01-19 NOTE — Op Note (Signed)
01/19/2016  8:31 AM  PATIENT:  Katie Singh  65 y.o. 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  POST-OPERATIVE DIAGNOSIS:  possible inflamatory arthritis right DIP joint index finger  PROCEDURE:  Procedure(s): DISTAL INTERPHALANGEAL JOINT FUSION (Right)  SURGEON: Laurene Footman, MD  ASSISTANTS: None  ANESTHESIA:   general  EBL:  Total I/O In: 1000 [I.V.:1000] Out: 10 [Blood:10]  BLOOD ADMINISTERED:none  DRAINS: none   LOCAL MEDICATIONS USED:  MARCAINE     SPECIMEN:  Source of Specimen:  Synovium from DIP joint  DISPOSITION OF SPECIMEN:  PATHOLOGY  COUNTS:  YES  TOURNIQUET:    IMPLANTS: Acumed Acutrak fusion system 30 mm screw  DICTATION: .Dragon Dictation patient was brought the operating room and after adequate anesthesia was obtained the right arm was prepped and draped in sterile fashion. After patient identification and timeout procedures were completed, tourniquet was raised. T-shaped incision was made over the distal interphalangeal joint and the extensor tendon incised. There is inflammatory tissue present which was debrided and sent as a specimen, there did not appear to be gross infection. The wound was thoroughly irrigated and the joint opened with a rongeur used to debride the joint surfaces to allow for bleeding bone for subsequent fusion. The guidewires then inserted out the tip of the finger and then retrograded back into the middle phalanx measured and the 30 mm screw obtained. Drilling was carried out and then the screw inserted with compression applied and this gave good compression of the fusion site with bone to bone contact at the DIP joint surfaces. With the screw buried under the tip of the distal phalanx of the screwdriver was removed and there was some excess skin secondary to a large amount of swelling over the joint and some of the skin from the flaps from the T were debrided to try to minimize dogears at  the incision edges. The skin was closed using simple interrupted 4-0 nylon followed by Xeroform 4 x 4's web roll and bias wrap. Prior to the start of the case with the incision after timeout procedure been completed 10 cc of half percent Sensorcaine was placed as a digital block to aid in postop analgesia  PLAN OF CARE: Discharge to home after PACU  PATIENT DISPOSITION:  PACU - hemodynamically stable.

## 2016-01-19 NOTE — Transfer of Care (Signed)
Immediate Anesthesia Transfer of Care Note  Patient: Katie Singh  Procedure(s) Performed: Procedure(s): DISTAL INTERPHALANGEAL JOINT FUSION (Right)  Patient Location: PACU  Anesthesia Type:General  Level of Consciousness: sedated  Airway & Oxygen Therapy: Patient Spontanous Breathing and Patient connected to face mask oxygen  Post-op Assessment: Report given to RN and Post -op Vital signs reviewed and stable  Post vital signs: Reviewed and stable  Last Vitals:  Vitals:   01/19/16 0606  BP: 137/82  Pulse: 92  Resp: 14  Temp: 36.8 C    Last Pain:  Vitals:   01/19/16 0606  TempSrc: Oral         Complications:No complications

## 2016-01-19 NOTE — Progress Notes (Signed)
Right hand elevated on pillows

## 2016-01-19 NOTE — Progress Notes (Signed)
Can wiggle fingers on right hand    Warm and dry

## 2016-01-19 NOTE — Progress Notes (Signed)
No complaints of pain on discharge 

## 2016-01-19 NOTE — H&P (Signed)
Reviewed paper H+P, will be scanned into chart. No changes noted.  

## 2016-01-19 NOTE — Discharge Instructions (Addendum)
Leave bandage in place until recheck. Reinforced if there is some bloody drainage. Medicines as directed pain and nausea medication prescribed. Elevate hand is much as possible today and tomorrow to minimize swelling  AMBULATORY SURGERY  DISCHARGE INSTRUCTIONS   1) The drugs that you were given will stay in your system until tomorrow so for the next 24 hours you should not:  A) Drive an automobile B) Make any legal decisions C) Drink any alcoholic beverage   2) You may resume regular meals tomorrow.  Today it is better to start with liquids and gradually work up to solid foods.  You may eat anything you prefer, but it is better to start with liquids, then soup and crackers, and gradually work up to solid foods.   3) Please notify your doctor immediately if you have any unusual bleeding, trouble breathing, redness and pain at the surgery site, drainage, fever, or pain not relieved by medication.    4) Additional Instructions:        Please contact your physician with any problems or Same Day Surgery at [phone], Monday through Friday 6 am to 4 pm, or  at [facility] number at [phone].

## 2016-01-20 LAB — SURGICAL PATHOLOGY

## 2016-01-23 NOTE — Anesthesia Postprocedure Evaluation (Signed)
Anesthesia Post Note  Patient: Katie Singh  Procedure(s) Performed: Procedure(s) (LRB): DISTAL INTERPHALANGEAL JOINT FUSION (Right)  Patient location during evaluation: PACU Anesthesia Type: General Level of consciousness: awake and alert Pain management: pain level controlled Vital Signs Assessment: post-procedure vital signs reviewed and stable Respiratory status: spontaneous breathing, nonlabored ventilation, respiratory function stable and patient connected to nasal cannula oxygen Cardiovascular status: blood pressure returned to baseline and stable Postop Assessment: no signs of nausea or vomiting Anesthetic complications: no    Last Vitals:  Vitals:   01/19/16 0914 01/19/16 0929  BP: 140/72 136/84  Pulse: 78 76  Resp: 14   Temp: 36.5 C     Last Pain:  Vitals:   01/20/16 0832  TempSrc:   PainSc: [name]

## 2016-02-01 ENCOUNTER — Ambulatory Visit (INDEPENDENT_AMBULATORY_CARE_PROVIDER_SITE_OTHER): Payer: PPO | Admitting: Family Medicine

## 2016-02-01 ENCOUNTER — Encounter: Payer: Self-pay | Admitting: Family Medicine

## 2016-02-01 VITALS — BP 133/83 | HR 78 | Temp 97.8°F | Wt 285.0 lb

## 2016-02-01 DIAGNOSIS — E1122 Type 2 diabetes mellitus with diabetic chronic kidney disease: Secondary | ICD-10-CM

## 2016-02-01 DIAGNOSIS — N184 Chronic kidney disease, stage 4 (severe): Secondary | ICD-10-CM | POA: Diagnosis not present

## 2016-02-01 DIAGNOSIS — E785 Hyperlipidemia, unspecified: Secondary | ICD-10-CM | POA: Diagnosis not present

## 2016-02-01 DIAGNOSIS — Z13820 Encounter for screening for osteoporosis: Secondary | ICD-10-CM | POA: Diagnosis not present

## 2016-02-01 DIAGNOSIS — I10 Essential (primary) hypertension: Secondary | ICD-10-CM | POA: Diagnosis not present

## 2016-02-01 LAB — LP+ALT+AST PICCOLO, WAIVED
ALT (SGPT) Piccolo, Waived: 24 U/L (ref 10–47)
AST (SGOT) PICCOLO, WAIVED: 25 U/L (ref 11–38)
CHOL/HDL RATIO PICCOLO,WAIVE: 3.8 mg/dL
CHOLESTEROL PICCOLO, WAIVED: 142 mg/dL (ref <200)
HDL Chol Piccolo, Waived: 37 mg/dL — ABNORMAL LOW (ref >59)
LDL CHOL CALC PICCOLO WAIVED: 77 mg/dL (ref <100)
Triglycerides Piccolo,Waived: 139 mg/dL (ref <150)
VLDL Chol Calc Piccolo,Waive: 28 mg/dL (ref <30)

## 2016-02-01 LAB — BAYER DCA HB A1C WAIVED: HB A1C (BAYER DCA - WAIVED): 6.5 % (ref <7.0)

## 2016-02-01 NOTE — Progress Notes (Signed)
BP 133/83 (BP Location: Left Arm, Patient Position: Sitting, Cuff Size: Normal)   Pulse 78   Temp 97.8 F (36.6 C)   Wt 285 lb (129.3 kg) Comment: with shoes  SpO2 99%   BMI 43.33 kg/m    Subjective:    Patient ID: Katie Singh, female    DOB: [birth year], 65 y.o.   MRN: [id number]  HPI: Katie Singh is a 65 y.o. female  Chief Complaint  Patient presents with  . Hypertension  Patient doing well with no complaints from medications. Blood pressure good control  fingers had surgery and resolving Cholesterol diabetes doing well with no complaints noted low blood sugar spells.  Relevant past medical, surgical, family and social history reviewed and updated as indicated. Interim medical history since our last visit reviewed. Allergies and medications reviewed and updated.  Review of Systems  Constitutional: Negative.   Respiratory: Negative.   Cardiovascular: Negative.     Per HPI unless specifically indicated above     Objective:    BP 133/83 (BP Location: Left Arm, Patient Position: Sitting, Cuff Size: Normal)   Pulse 78   Temp 97.8 F (36.6 C)   Wt 285 lb (129.3 kg) Comment: with shoes  SpO2 99%   BMI 43.33 kg/m   Wt Readings from Last 3 Encounters:  02/01/16 285 lb (129.3 kg)  01/19/16 283 lb (128.4 kg)  01/11/16 290 lb (131.5 kg)    Physical Exam  Constitutional: She is oriented to person, place, and time. She appears well-developed and well-nourished. No distress.  HENT:  Head: Normocephalic and atraumatic.  Right Ear: Hearing normal.  Left Ear: Hearing normal.  Nose: Nose normal.  Eyes: Conjunctivae and lids are normal. Right eye exhibits no discharge. Left eye exhibits no discharge. No scleral icterus.  Cardiovascular: Normal rate, regular rhythm and normal heart sounds.   Pulmonary/Chest: Effort normal and breath sounds normal. No respiratory distress.  Musculoskeletal: Normal range of motion.  Neurological: She is alert and oriented to person,  place, and time.  Skin: Skin is intact. No rash noted.  Psychiatric: She has a normal mood and affect. Her speech is normal and behavior is normal. Judgment and thought content normal. Cognition and memory are normal.    Results for orders placed or performed during the hospital encounter of 01/19/16  Glucose, capillary  Result Value Ref Range   Glucose-Capillary 166 (H) 65 - 99 mg/dL  Glucose, capillary  Result Value Ref Range   Glucose-Capillary 132 (H) 65 - 99 mg/dL  Surgical pathology  Result Value Ref Range   SURGICAL PATHOLOGY      Surgical Pathology CASE: [id number] PATIENT: Katie Singh Surgical Pathology Report     SPECIMEN SUBMITTED: A. Synovium, right index DIP joint  CLINICAL HISTORY: None provided  PRE-OPERATIVE DIAGNOSIS: Infected finger  POST-OPERATIVE DIAGNOSIS: Possible inflammatory arthritis     DIAGNOSIS:  A. SYNOVIUM, RIGHT INDEX DIP JOINT; EXCISION: - BENIGN SYNOVIAL CHONDROID METAPLASIA WITH OSSIFICATION.  Comment: These findings could be compatible with synovial chondromatosis in the appropriate clinical setting.  GROSS DESCRIPTION:  A. Labeled: synovium right index DIP joint  Tissue fragment(s): multiple  Size: aggregate, 1.1 x 0.5 x 0.1 cm  Description: firm to rubbery pink-tan fragments  Entirely submitted in one cassette(s).    Final Diagnosis performed by Quay Burow, MD.  Electronically signed 01/20/2016 2:40:17PM    The electronic signature indicates that the named Attending Pathologist has evaluated the specimen  Technical component  performed at [facility], [place]  [address], [place], [place] [address] Lab: [phone] Dir: Darrick Penna. Evette Doffing, MD  Professional component performed at [facility], [facility], [place], [place], [place] [address] Lab: [phone] Dir: Dellia Nims. Reuel Derby, MD        Assessment & Plan:   Problem List Items Addressed This Visit      Cardiovascular and  Mediastinum   Hypertension - Primary    The current medical regimen is effective;  continue present plan and medications.         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       Relevant Orders   Bayer DCA Hb A1c Waived     Other   Hyperlipidemia    The current medical regimen is effective;  continue present plan and medications.       Relevant Orders   LP+ALT+AST [place]    Other Visit Diagnoses    Screening for osteoporosis       Relevant Orders   DG Bone Density       Follow up plan: Return in about 3 months (around 05/02/2016) for Physical Exam, Hemoglobin A1c.

## 2016-02-01 NOTE — Assessment & Plan Note (Signed)
The current medical regimen is effective;  continue present plan and medications.  

## 2016-02-03 DIAGNOSIS — Z8781 Personal history of (healed) traumatic fracture: Secondary | ICD-10-CM | POA: Diagnosis not present

## 2016-02-03 DIAGNOSIS — Z967 Presence of other bone and tendon implants: Secondary | ICD-10-CM | POA: Diagnosis not present

## 2016-02-07 DIAGNOSIS — H25011 Cortical age-related cataract, right eye: Secondary | ICD-10-CM | POA: Diagnosis not present

## 2016-02-07 DIAGNOSIS — H2511 Age-related nuclear cataract, right eye: Secondary | ICD-10-CM | POA: Diagnosis not present

## 2016-02-07 DIAGNOSIS — H25811 Combined forms of age-related cataract, right eye: Secondary | ICD-10-CM | POA: Diagnosis not present

## 2016-02-27 ENCOUNTER — Other Ambulatory Visit: Payer: Self-pay | Admitting: Family Medicine

## 2016-02-27 NOTE — Telephone Encounter (Signed)
rx

## 2016-03-05 DIAGNOSIS — M79644 Pain in right finger(s): Secondary | ICD-10-CM | POA: Diagnosis not present

## 2016-04-22 ENCOUNTER — Other Ambulatory Visit: Payer: Self-pay | Admitting: Family Medicine

## 2016-04-23 NOTE — Telephone Encounter (Signed)
Patient's husband answered. He explained patient was asleep, she'd been up all night.  Explained to husband who I was, and asked if it was okay if we'd call back around 4:00. He said that would be perfectly fine.

## 2016-04-23 NOTE — Telephone Encounter (Signed)
Routing to provider. appt on 05/02/16

## 2016-04-23 NOTE — Telephone Encounter (Signed)
Call pt 

## 2016-04-24 NOTE — Telephone Encounter (Signed)
Phone call Discussed with patient using tramadol taken about 2 a day takes him for arthritis which is varied especially in her hands hips knees. Will call in prescription.

## 2016-04-27 ENCOUNTER — Other Ambulatory Visit: Payer: Self-pay | Admitting: Family Medicine

## 2016-05-02 ENCOUNTER — Ambulatory Visit (INDEPENDENT_AMBULATORY_CARE_PROVIDER_SITE_OTHER): Payer: PPO | Admitting: Family Medicine

## 2016-05-02 ENCOUNTER — Encounter: Payer: Self-pay | Admitting: Family Medicine

## 2016-05-02 VITALS — BP 118/73 | HR 77 | Temp 97.5°F | Wt 285.2 lb

## 2016-05-02 DIAGNOSIS — Z1239 Encounter for other screening for malignant neoplasm of breast: Secondary | ICD-10-CM

## 2016-05-02 DIAGNOSIS — N184 Chronic kidney disease, stage 4 (severe): Secondary | ICD-10-CM | POA: Diagnosis not present

## 2016-05-02 DIAGNOSIS — Z1231 Encounter for screening mammogram for malignant neoplasm of breast: Secondary | ICD-10-CM

## 2016-05-02 DIAGNOSIS — F329 Major depressive disorder, single episode, unspecified: Secondary | ICD-10-CM

## 2016-05-02 DIAGNOSIS — E1122 Type 2 diabetes mellitus with diabetic chronic kidney disease: Secondary | ICD-10-CM | POA: Diagnosis not present

## 2016-05-02 DIAGNOSIS — E7800 Pure hypercholesterolemia, unspecified: Secondary | ICD-10-CM

## 2016-05-02 LAB — BAYER DCA HB A1C WAIVED: HB A1C (BAYER DCA - WAIVED): 7.2 % — ABNORMAL HIGH (ref <7.0)

## 2016-05-02 NOTE — Assessment & Plan Note (Signed)
The current medical regimen is effective;  continue present plan and medications.  

## 2016-05-02 NOTE — Progress Notes (Signed)
BP 118/73 (BP Location: Right Arm, Patient Position: Sitting, Cuff Size: Normal)   Pulse 77   Temp 97.5 F (36.4 C) (Oral)   Wt 285 lb 3.2 oz (129.4 kg)   SpO2 97%   BMI 43.36 kg/m    Subjective:    Patient ID: Katie Singh, female    DOB: [birth year], 65 y.o.   MRN: [id number]  HPI: Katie Singh is a 65 y.o. female  Chief Complaint  Patient presents with  . Hyperlipidemia  . Hypertension  . Follow-up   Patient all in all doing well no complaints concerned her blood sugars can be up from last time has had a great deal of stress has been unable to lose weight. Blood pressure doing okay no complaints from other medication. On review patient very reluctant to start diabetes medicines. Due to financial considerations patient hasn't been able to go back to nephrology for care or treatment.  Relevant past medical, surgical, family and social history reviewed and updated as indicated. Interim medical history since our last visit reviewed. Allergies and medications reviewed and updated.  Review of Systems  Constitutional: Negative.   Respiratory: Negative.   Cardiovascular: Negative.     Per HPI unless specifically indicated above     Objective:    BP 118/73 (BP Location: Right Arm, Patient Position: Sitting, Cuff Size: Normal)   Pulse 77   Temp 97.5 F (36.4 C) (Oral)   Wt 285 lb 3.2 oz (129.4 kg)   SpO2 97%   BMI 43.36 kg/m   Wt Readings from Last 3 Encounters:  05/02/16 285 lb 3.2 oz (129.4 kg)  02/01/16 285 lb (129.3 kg)  01/19/16 283 lb (128.4 kg)    Physical Exam  Constitutional: She is oriented to person, place, and time. She appears well-developed and well-nourished. No distress.  HENT:  Head: Normocephalic and atraumatic.  Right Ear: Hearing normal.  Left Ear: Hearing normal.  Nose: Nose normal.  Eyes: Conjunctivae and lids are normal. Right eye exhibits no discharge. Left eye exhibits no discharge. No scleral icterus.  Cardiovascular: Normal  rate, regular rhythm and normal heart sounds.   Pulmonary/Chest: Effort normal and breath sounds normal. No respiratory distress.  Musculoskeletal: Normal range of motion.  Neurological: She is alert and oriented to person, place, and time.  Skin: Skin is intact. No rash noted.  Psychiatric: She has a normal mood and affect. Her speech is normal and behavior is normal. Judgment and thought content normal. Cognition and memory are normal.    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 Patient will double down on diet exercise weight loss go to nephrology and if not better next office visit will strongly consider insulin. We'll also consider referral.      Relevant Orders   Bayer DCA Hb A1c Waived (Completed)   MM Digital Screening     Other   Hyperlipidemia    The current medical regimen is effective;  continue present plan and medications.       Major depression, chronic    The current medical regimen is effective;  continue  present plan and medications.        Other Visit Diagnoses    Breast cancer screening       Relevant Orders   MM Digital Screening       Follow up plan: Return in about 3 months (around 07/31/2016) for Hemoglobin A1c, BMP,  Lipids, ALT, AST.

## 2016-05-02 NOTE — Assessment & Plan Note (Signed)
Limitations on treating diabetes with CK D. Patient will double down on diet exercise weight loss go to nephrology and if not better next office visit will strongly consider insulin. We'll also consider referral.

## 2016-05-15 ENCOUNTER — Other Ambulatory Visit: Payer: Self-pay

## 2016-05-15 MED ORDER — MELOXICAM 15 MG PO TABS: 15.0000 mg | ORAL_TABLET | Freq: Every day | ORAL | 6 refills | Status: DC

## 2016-05-15 NOTE — Telephone Encounter (Signed)
Last Visit 05/02/2016.  Request for Meloxicam 15mg  tablet w/ 90 day supply.

## 2016-05-20 ENCOUNTER — Other Ambulatory Visit: Payer: Self-pay | Admitting: Family Medicine

## 2016-05-25 ENCOUNTER — Other Ambulatory Visit: Payer: Self-pay | Admitting: Family Medicine

## 2016-07-08 ENCOUNTER — Other Ambulatory Visit: Payer: Self-pay | Admitting: Family Medicine

## 2016-07-11 ENCOUNTER — Other Ambulatory Visit: Payer: Self-pay | Admitting: Family Medicine

## 2016-07-12 NOTE — Telephone Encounter (Signed)
Last (acute) OV: 05/02/16 Last routine OV: 12/201/17 Next OV: 08/01/16

## 2016-07-16 ENCOUNTER — Other Ambulatory Visit: Payer: Self-pay | Admitting: Family Medicine

## 2016-07-23 ENCOUNTER — Encounter: Payer: Self-pay | Admitting: Family Medicine

## 2016-07-23 ENCOUNTER — Ambulatory Visit (INDEPENDENT_AMBULATORY_CARE_PROVIDER_SITE_OTHER): Payer: PPO | Admitting: Family Medicine

## 2016-07-23 VITALS — BP 165/99 | HR 84 | Temp 98.7°F | Wt 278.0 lb

## 2016-07-23 DIAGNOSIS — J111 Influenza due to unidentified influenza virus with other respiratory manifestations: Secondary | ICD-10-CM

## 2016-07-23 MED ORDER — ALBUTEROL SULFATE HFA 108 (90 BASE) MCG/ACT IN AERS: 2.0000 | INHALATION_SPRAY | Freq: Four times a day (QID) | RESPIRATORY_TRACT | 0 refills | Status: DC | PRN

## 2016-07-23 MED ORDER — OSELTAMIVIR PHOSPHATE 75 MG PO CAPS: 75.0000 mg | ORAL_CAPSULE | Freq: Two times a day (BID) | ORAL | 0 refills | Status: DC

## 2016-07-23 NOTE — Progress Notes (Signed)
BP (!) 165/99   Pulse 84   Temp 98.7 F (37.1 C)   Wt 278 lb (126.1 kg)   SpO2 96%   BMI 42.27 kg/m    Subjective:    Patient ID: Katie Singh, female    DOB: [birth year], 66 y.o.   MRN: [id number]  HPI: Katie Singh is a 66 y.o. female  Chief Complaint  Patient presents with  . URI    x 2 days, hot and sweaty then chills, unsure of fever, some body aches, SOB, occasional cough, some chest/head congestion, occasional headache. No sore throat, no runny nose.    Patient presents with 2 day history of chills, sweats, subjective fever, body aches, chest tightness, dry cough, congestion. Denies ear pain, sinus pain, sore throat. Has been taking tylenol prn with some relief. No sick contacts reported.   Past Medical History:  Diagnosis Date  . Anemia    history of  . Chronic kidney disease    stage 3-4  . Complication of anesthesia   . Diabetes mellitus without complication (HCC)    diet controlled  . GERD (gastroesophageal reflux disease)   . Hepatitis    fatty live  . History of kidney stones   . Hyperlipidemia    history of, no longer taking meds.  . Hypertension   . Klebsiella infection   . Migraine headache with aura    history of  . MSSA (methicillin susceptible Staphylococcus aureus)   . Myelodysplasia   . OA (osteoarthritis)   . PONV (postoperative nausea and vomiting)   . 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 Rheumatic fever in pediatric patient 83   Social History   Social History  . Marital status: Divorced    Spouse name: N/A  . Number of children: N/A  . Years of education: N/A   Occupational History  . Not on file.   Social History Main Topics  . Smoking status: Never Smoker  . Smokeless tobacco: Never Used  . Alcohol use Yes     Comment:  once every 2-3 years.  . Drug use: No  . Sexual activity: No   Other Topics Concern  . Not on file   Social History Narrative  . No narrative on file    Relevant past  medical, surgical, family and social history reviewed and updated as indicated. Interim medical history since our last visit reviewed. Allergies and medications reviewed and updated.  Review of Systems  Constitutional: Positive for chills, diaphoresis and fever.  HENT: Positive for congestion.   Eyes: Negative.   Respiratory: Positive for cough and chest tightness.   Cardiovascular: Negative.   Gastrointestinal: Negative.   Genitourinary: Negative.   Musculoskeletal: Positive for myalgias.  Neurological: Negative.   Psychiatric/Behavioral: Negative.     Per HPI unless specifically indicated above     Objective:    BP (!) 165/99   Pulse 84   Temp 98.7 F (37.1 C)   Wt 278 lb (126.1 kg)   SpO2 96%   BMI 42.27 kg/m   Wt Readings from Last 3 Encounters:  07/23/16 278 lb (126.1 kg)  05/02/16 285 lb 3.2 oz (129.4 kg)  02/01/16 285 lb (129.3 kg)    Physical Exam  Constitutional: She is oriented to person, place, and time. She appears well-developed and well-nourished. No distress.  HENT:  Head: Atraumatic.  Right Ear: External ear normal.  Left Ear: External ear normal.  Nose: Nose normal.  Oropharynx edematous  Eyes: Conjunctivae are normal. Pupils are equal, round, and reactive to light.  Neck: Normal range of motion. Neck supple.  Cardiovascular: Normal rate and normal heart sounds.   Pulmonary/Chest: Effort normal and breath sounds normal. No respiratory distress.  Musculoskeletal: Normal range of motion.  Lymphadenopathy:    She has no cervical adenopathy.  Neurological: She is alert and oriented to person, place, and time.  Skin: Skin is warm and dry.  Psychiatric: She has a normal mood and affect. Her behavior is normal.  Nursing note and vitals reviewed.     Assessment & Plan:   Problem List Items Addressed This Visit    None    Visit Diagnoses    Influenza    -  Primary   Suspect flu, will start tamiflu while w/in window. Discussed OTC remedies for  symptomatic relief, albuterol prn for SOB. Return precautions given   Relevant Medications   oseltamivir (TAMIFLU) 75 MG capsule       Follow up plan: Return for as scheduled.

## 2016-07-24 NOTE — Patient Instructions (Signed)
Follow up for regular follow up visit.

## 2016-08-01 ENCOUNTER — Encounter: Payer: Self-pay | Admitting: Family Medicine

## 2016-08-01 ENCOUNTER — Ambulatory Visit (INDEPENDENT_AMBULATORY_CARE_PROVIDER_SITE_OTHER): Payer: PPO | Admitting: Family Medicine

## 2016-08-01 VITALS — BP 128/80 | HR 81 | Wt 280.0 lb

## 2016-08-01 DIAGNOSIS — N184 Chronic kidney disease, stage 4 (severe): Secondary | ICD-10-CM | POA: Diagnosis not present

## 2016-08-01 DIAGNOSIS — Z1231 Encounter for screening mammogram for malignant neoplasm of breast: Secondary | ICD-10-CM

## 2016-08-01 DIAGNOSIS — Z23 Encounter for immunization: Secondary | ICD-10-CM | POA: Diagnosis not present

## 2016-08-01 DIAGNOSIS — E7800 Pure hypercholesterolemia, unspecified: Secondary | ICD-10-CM

## 2016-08-01 DIAGNOSIS — R079 Chest pain, unspecified: Secondary | ICD-10-CM

## 2016-08-01 DIAGNOSIS — E1122 Type 2 diabetes mellitus with diabetic chronic kidney disease: Secondary | ICD-10-CM

## 2016-08-01 DIAGNOSIS — Z1239 Encounter for other screening for malignant neoplasm of breast: Secondary | ICD-10-CM

## 2016-08-01 DIAGNOSIS — I10 Essential (primary) hypertension: Secondary | ICD-10-CM

## 2016-08-01 LAB — HEMOGLOBIN A1C
Est. average glucose Bld gHb Est-mCnc: 154 mg/dL
HEMOGLOBIN A1C: 7 % — AB (ref 4.8–5.6)

## 2016-08-01 LAB — LP+ALT+AST PICCOLO, WAIVED
ALT (SGPT) PICCOLO, WAIVED: 22 U/L (ref 10–47)
AST (SGOT) PICCOLO, WAIVED: 25 U/L (ref 11–38)
CHOLESTEROL PICCOLO, WAIVED: 145 mg/dL (ref <200)
Chol/HDL Ratio Piccolo,Waive: 3.6 mg/dL
HDL CHOL PICCOLO, WAIVED: 41 mg/dL — AB (ref >59)
LDL Chol Calc Piccolo Waived: 79 mg/dL (ref <100)
TRIGLYCERIDES PICCOLO,WAIVED: 128 mg/dL (ref <150)
VLDL Chol Calc Piccolo,Waive: 26 mg/dL (ref <30)

## 2016-08-01 MED ORDER — FUROSEMIDE 40 MG PO TABS: 40.0000 mg | ORAL_TABLET | Freq: Every day | ORAL | 2 refills | Status: DC

## 2016-08-01 NOTE — Progress Notes (Signed)
BP 128/80 (BP Location: Left Arm)   Pulse 81   Wt 280 lb (127 kg)   SpO2 99%   BMI 42.57 kg/m    Subjective:    Patient ID: Katie Singh, female    DOB: 05/07/1951, 66 y.o.   MRN: [id number]  HPI: Katie Singh is a 66 y.o. female  Chief Complaint  Patient presents with  . Follow-up  . Hyperlipidemia  . Diabetes   Patient's primary concern today is shortness of breath with modest exertion such as running a vacuum cleaner or any kind of fast movement going up steps or other increased activity levels. This results in quickly becoming short of breath no real chest pressure but does get hot and sweaty with this and nausea. He also has some right arm symptoms with exertion and shortness of breath. Patient's previously been to cardiology several years ago and was told she was okay.  Diabetes doing well no complaints no noted low blood sugar spells.    Relevant past medical, surgical, family and social history reviewed and updated as indicated. Interim medical history since our last visit reviewed. Allergies and medications reviewed and updated.  Review of Systems  Constitutional: Positive for activity change, diaphoresis and fatigue. Negative for appetite change, chills and fever.  HENT: Negative.   Eyes: Negative.   Respiratory: Positive for shortness of breath. Negative for wheezing.   Cardiovascular: Positive for chest pain. Negative for palpitations and leg swelling.  Gastrointestinal: Positive for nausea.  Endocrine: Negative.   Genitourinary: Negative.   Musculoskeletal: Negative.   Allergic/Immunologic: Negative.   Neurological: Negative.   Hematological: Negative.     Per HPI unless specifically indicated above     Objective:    BP 128/80 (BP Location: Left Arm)   Pulse 81   Wt 280 lb (127 kg)   SpO2 99%   BMI 42.57 kg/m   Wt Readings from Last 3 Encounters:  08/01/16 280 lb (127 kg)  07/23/16 278 lb (126.1 kg)  05/02/16 285 lb 3.2 oz (129.4 kg)    Physical Exam  Constitutional: She is oriented to person, place, and time. She appears well-developed and well-nourished.  HENT:  Head: Normocephalic and atraumatic.  Eyes: Conjunctivae and EOM are normal.  Neck: Normal range of motion. Thyromegaly present.  Cardiovascular: Normal rate, regular rhythm and normal heart sounds.   Pulmonary/Chest: Effort normal and breath sounds normal. No respiratory distress. She has no wheezes. She has no rales. She exhibits no tenderness.  Abdominal: Soft. Bowel sounds are normal.  Musculoskeletal: Normal range of motion. She exhibits no edema or deformity.  Lymphadenopathy:    She has cervical adenopathy.  Neurological: She is alert and oriented to person, place, and time.  Skin: Skin is warm and dry. No erythema.  Psychiatric: She has a normal mood and affect. Her behavior is normal. Judgment and thought content normal.    Results for orders placed or performed in visit on 05/02/16  Bayer DCA Hb A1c Waived  Result Value Ref Range   Bayer DCA Hb A1c Waived 7.2 (H) <7.0 %      Assessment & Plan:   Problem List Items Addressed This Visit      Cardiovascular and Mediastinum   Hypertension - Primary    The current medical regimen is effective;  continue present plan and medications.       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       Relevant Orders   Basic metabolic panel   LP+ALT+AST Piccolo, Waived   Hemoglobin A1c     Other   Hyperlipidemia    The current medical regimen is effective;  continue present plan and medications.       Relevant Medications   furosemide (LASIX) 40 MG tablet   Other Relevant Orders   Basic metabolic panel   LP+ALT+AST Piccolo, Waived   Hemoglobin A1c    Chest pain    EKG with no acute changes. Due to marked threat to life or bodily function with patient's angina symptoms will refer to cardiology to further evaluate. Patient education was given on calling 911 worsening symptoms. Patient already taken aspirin one a day discussed aspirin use. Discuss not doing triggers that bring on her angina symptoms.. Reviewed previous labs which were essentially normal. Patient education given on use of her current medications. Patient education given on referral process.      Relevant Orders   EKG 12-Lead (Completed)   Ambulatory referral to Cardiology    Other Visit Diagnoses    Breast cancer screening       Relevant Orders   MM DIGITAL SCREENING BILATERAL   Need for pneumococcal vaccination       Relevant Orders   Pneumococcal polysaccharide vaccine 23-valent greater than or equal to 2yo subcutaneous/IM (Completed)       Follow up plan: Return in about 3 months (around 11/01/2016) for Physical Exam, Hemoglobin A1c.

## 2016-08-01 NOTE — Assessment & Plan Note (Signed)
The current medical regimen is effective;  continue present plan and medications.  

## 2016-08-01 NOTE — Assessment & Plan Note (Signed)
EKG with no acute changes. Due to marked threat to life or bodily function with patient's angina symptoms will refer to cardiology to further evaluate. Patient education was given on calling 911 worsening symptoms. Patient already taken aspirin one a day discussed aspirin use. Discuss not doing triggers that bring on her angina symptoms.. Reviewed previous labs which were essentially normal. Patient education given on use of her current medications. Patient education given on referral process.

## 2016-08-02 ENCOUNTER — Encounter: Payer: Self-pay | Admitting: Family Medicine

## 2016-08-02 LAB — BASIC METABOLIC PANEL
BUN / CREAT RATIO: 17 (ref 12–28)
BUN: 27 mg/dL (ref 8–27)
CALCIUM: 9.2 mg/dL (ref 8.7–10.3)
CO2: 20 mmol/L (ref 18–29)
Chloride: 101 mmol/L (ref 96–106)
Creatinine, Ser: 1.59 mg/dL — ABNORMAL HIGH (ref 0.57–1.00)
GFR, EST AFRICAN AMERICAN: 39 mL/min/{1.73_m2} — AB (ref >59)
GFR, EST NON AFRICAN AMERICAN: 34 mL/min/{1.73_m2} — AB (ref >59)
Glucose: 146 mg/dL — ABNORMAL HIGH (ref 65–99)
Potassium: 4.4 mmol/L (ref 3.5–5.2)
Sodium: 139 mmol/L (ref 134–144)

## 2016-08-13 DIAGNOSIS — R0602 Shortness of breath: Secondary | ICD-10-CM | POA: Diagnosis not present

## 2016-08-13 DIAGNOSIS — E782 Mixed hyperlipidemia: Secondary | ICD-10-CM | POA: Diagnosis not present

## 2016-08-13 DIAGNOSIS — N183 Chronic kidney disease, stage 3 (moderate): Secondary | ICD-10-CM | POA: Diagnosis not present

## 2016-08-13 DIAGNOSIS — I10 Essential (primary) hypertension: Secondary | ICD-10-CM | POA: Diagnosis not present

## 2016-09-18 DIAGNOSIS — R0602 Shortness of breath: Secondary | ICD-10-CM | POA: Diagnosis not present

## 2016-09-19 ENCOUNTER — Other Ambulatory Visit: Payer: Self-pay | Admitting: Family Medicine

## 2016-09-25 DIAGNOSIS — E1122 Type 2 diabetes mellitus with diabetic chronic kidney disease: Secondary | ICD-10-CM | POA: Diagnosis not present

## 2016-09-25 DIAGNOSIS — N183 Chronic kidney disease, stage 3 (moderate): Secondary | ICD-10-CM | POA: Diagnosis not present

## 2016-09-25 DIAGNOSIS — I10 Essential (primary) hypertension: Secondary | ICD-10-CM | POA: Diagnosis not present

## 2016-09-25 DIAGNOSIS — N179 Acute kidney failure, unspecified: Secondary | ICD-10-CM | POA: Diagnosis not present

## 2016-09-26 DIAGNOSIS — R0602 Shortness of breath: Secondary | ICD-10-CM | POA: Diagnosis not present

## 2016-09-26 DIAGNOSIS — E782 Mixed hyperlipidemia: Secondary | ICD-10-CM | POA: Diagnosis not present

## 2016-09-26 DIAGNOSIS — I10 Essential (primary) hypertension: Secondary | ICD-10-CM | POA: Diagnosis not present

## 2016-09-26 DIAGNOSIS — N183 Chronic kidney disease, stage 3 (moderate): Secondary | ICD-10-CM | POA: Diagnosis not present

## 2016-10-06 ENCOUNTER — Other Ambulatory Visit: Payer: Self-pay | Admitting: Family Medicine

## 2016-10-09 NOTE — Telephone Encounter (Signed)
Routing to provider. Appt on 11/08/16.

## 2016-10-10 ENCOUNTER — Other Ambulatory Visit: Payer: Self-pay | Admitting: Family Medicine

## 2016-10-10 NOTE — Telephone Encounter (Signed)
Last routine OV: 08/01/16 Next OV: 10/17/16

## 2016-10-12 ENCOUNTER — Other Ambulatory Visit: Payer: Self-pay | Admitting: Family Medicine

## 2016-10-15 NOTE — Telephone Encounter (Signed)
  Last routine OV: 08/01/16 Next OV: 10/17/16

## 2016-10-17 ENCOUNTER — Ambulatory Visit: Payer: PPO

## 2016-10-18 ENCOUNTER — Telehealth: Payer: Self-pay

## 2016-10-18 NOTE — Telephone Encounter (Signed)
Called to r/s missed AWV. Pt requested AWV and physical to be in august.

## 2016-11-08 ENCOUNTER — Encounter: Payer: Self-pay | Admitting: Family Medicine

## 2016-11-10 ENCOUNTER — Other Ambulatory Visit: Payer: Self-pay | Admitting: Family Medicine

## 2016-11-23 ENCOUNTER — Encounter: Payer: Self-pay | Admitting: Family Medicine

## 2016-12-05 DIAGNOSIS — M25562 Pain in left knee: Secondary | ICD-10-CM | POA: Diagnosis not present

## 2016-12-05 DIAGNOSIS — G8929 Other chronic pain: Secondary | ICD-10-CM | POA: Diagnosis not present

## 2016-12-05 DIAGNOSIS — M150 Primary generalized (osteo)arthritis: Secondary | ICD-10-CM | POA: Diagnosis not present

## 2016-12-05 DIAGNOSIS — M1990 Unspecified osteoarthritis, unspecified site: Secondary | ICD-10-CM | POA: Diagnosis not present

## 2016-12-12 ENCOUNTER — Ambulatory Visit (INDEPENDENT_AMBULATORY_CARE_PROVIDER_SITE_OTHER): Payer: PPO

## 2016-12-12 VITALS — BP 136/84 | HR 79 | Temp 97.7°F | Resp 16 | Ht 69.0 in | Wt 289.6 lb

## 2016-12-12 DIAGNOSIS — Z0000 Encounter for general adult medical examination without abnormal findings: Secondary | ICD-10-CM | POA: Diagnosis not present

## 2016-12-12 NOTE — Patient Instructions (Signed)
Katie Singh , Thank you for taking time to come for your Medicare Wellness Visit. I appreciate your ongoing commitment to your health goals. Please review the following plan we discussed and let me know if I can assist you in the future.   Screening recommendations/referrals: Colonoscopy: Completed 03/15/2010 Mammogram: Completed 05/02/2016 Bone Density: Completed 11/09/2010 Recommended yearly ophthalmology/optometry visit for glaucoma screening and checkup Recommended yearly dental visit for hygiene and checkup  Vaccinations: Influenza vaccine: Up to date, due 03/2017 Pneumococcal vaccine: Up to date  Tdap vaccine: up to date Shingles vaccine: up to date     Advanced directives: Advance directive discussed with you today. I have provided a copy for you to complete at home and have notarized. Once this is complete please bring a copy in to our office so we can scan it into your chart.  Conditions/risks identified: Recommend drinking at least 2-3 glasses of water a day   Next appointment: Follow up on 01/02/2017 at 1:00pm with Dr.Crissman. Follow up in one year for your annual wellness exam.    Preventive Care 65 Years and Older, Female Preventive care refers to lifestyle choices and visits with your health care provider that can promote health and wellness. What does preventive care include?  A yearly physical exam. This is also called an annual well check.  Dental exams once or twice a year.  Routine eye exams. Ask your health care provider how often you should have your eyes checked.  Personal lifestyle choices, including:  Daily care of your teeth and gums.  Regular physical activity.  Eating a healthy diet.  Avoiding tobacco and drug use.  Limiting alcohol use.  Practicing safe sex.  Taking low-dose aspirin every day.  Taking vitamin and mineral supplements as recommended by your health care provider. What happens during an annual well check? The services and  screenings done by your health care provider during your annual well check will depend on your age, overall health, lifestyle risk factors, and family history of disease. Counseling  Your health care provider may ask you questions about your:  Alcohol use.  Tobacco use.  Drug use.  Emotional well-being.  Home and relationship well-being.  Sexual activity.  Eating habits.  History of falls.  Memory and ability to understand (cognition).  Work and work Statistician.  Reproductive health. Screening  You may have the following tests or measurements:  Height, weight, and BMI.  Blood pressure.  Lipid and cholesterol levels. These may be checked every 5 years, or more frequently if you are over 67 years old.  Skin check.  Lung cancer screening. You may have this screening every year starting at age 74 if you have a 30-pack-year history of smoking and currently smoke or have quit within the past 15 years.  Fecal occult blood test (FOBT) of the stool. You may have this test every year starting at age 63.  Flexible sigmoidoscopy or colonoscopy. You may have a sigmoidoscopy every 5 years or a colonoscopy every 10 years starting at age 24.  Hepatitis C blood test.  Hepatitis B blood test.  Sexually transmitted disease (STD) testing.  Diabetes screening. This is done by checking your blood sugar (glucose) after you have not eaten for a while (fasting). You may have this done every 1-3 years.  Bone density scan. This is done to screen for osteoporosis. You may have this done starting at age 95.  Mammogram. This may be done every 1-2 years. Talk to your health care provider about  how often you should have regular mammograms. Talk with your health care provider about your test results, treatment options, and if necessary, the need for more tests. Vaccines  Your health care provider may recommend certain vaccines, such as:  Influenza vaccine. This is recommended every  year.  Tetanus, diphtheria, and acellular pertussis (Tdap, Td) vaccine. You may need a Td booster every 10 years.  Zoster vaccine. You may need this after age 34.  Pneumococcal 13-valent conjugate (PCV13) vaccine. One dose is recommended after age 106.  Pneumococcal polysaccharide (PPSV23) vaccine. One dose is recommended after age 33. Talk to your health care provider about which screenings and vaccines you need and how often you need them. This information is not intended to replace advice given to you by your health care provider. Make sure you discuss any questions you have with your health care provider. Document Released: 05/27/2015 Document Revised: 01/18/2016 Document Reviewed: 03/01/2015 Elsevier Interactive Patient Education  2017 [place] Prevention in the Home Falls can cause injuries. They can happen to people of all ages. There are many things you can do to make your home safe and to help prevent falls. What can I do on the outside of my home?  Regularly fix the edges of walkways and driveways and fix any cracks.  Remove anything that might make you trip as you walk through a door, such as a raised step or threshold.  Trim any bushes or trees on the path to your home.  Use bright outdoor lighting.  Clear any walking paths of anything that might make someone trip, such as rocks or tools.  Regularly check to see if handrails are loose or broken. Make sure that both sides of any steps have handrails.  Any raised decks and porches should have guardrails on the edges.  Have any leaves, snow, or ice cleared regularly.  Use sand or salt on walking paths during winter.  Clean up any spills in your garage right away. This includes oil or grease spills. What can I do in the bathroom?  Use night lights.  Install grab bars by the toilet and in the tub and shower. Do not use towel bars as grab bars.  Use non-skid mats or decals in the tub or shower.  If you  need to sit down in the shower, use a plastic, non-slip stool.  Keep the floor dry. Clean up any water that spills on the floor as soon as it happens.  Remove soap buildup in the tub or shower regularly.  Attach bath mats securely with double-sided non-slip rug tape.  Do not have throw rugs and other things on the floor that can make you trip. What can I do in the bedroom?  Use night lights.  Make sure that you have a light by your bed that is easy to reach.  Do not use any sheets or blankets that are too big for your bed. They should not hang down onto the floor.  Have a firm chair that has side arms. You can use this for support while you get dressed.  Do not have throw rugs and other things on the floor that can make you trip. What can I do in the kitchen?  Clean up any spills right away.  Avoid walking on wet floors.  Keep items that you use a lot in easy-to-reach places.  If you need to reach something above you, use a strong step stool that has a grab bar.  Keep  electrical cords out of the way.  Do not use floor polish or wax that makes floors slippery. If you must use wax, use non-skid floor wax.  Do not have throw rugs and other things on the floor that can make you trip. What can I do with my stairs?  Do not leave any items on the stairs.  Make sure that there are handrails on both sides of the stairs and use them. Fix handrails that are broken or loose. Make sure that handrails are as long as the stairways.  Check any carpeting to make sure that it is firmly attached to the stairs. Fix any carpet that is loose or worn.  Avoid having throw rugs at the top or bottom of the stairs. If you do have throw rugs, attach them to the floor with carpet tape.  Make sure that you have a light switch at the top of the stairs and the bottom of the stairs. If you do not have them, ask someone to add them for you. What else can I do to help prevent falls?  Wear shoes  that:  Do not have high heels.  Have rubber bottoms.  Are comfortable and fit you well.  Are closed at the toe. Do not wear sandals.  If you use a stepladder:  Make sure that it is fully opened. Do not climb a closed stepladder.  Make sure that both sides of the stepladder are locked into place.  Ask someone to hold it for you, if possible.  Clearly mark and make sure that you can see:  Any grab bars or handrails.  First and last steps.  Where the edge of each step is.  Use tools that help you move around (mobility aids) if they are needed. These include:  Canes.  Walkers.  Scooters.  Crutches.  Turn on the lights when you go into a dark area. Replace any light bulbs as soon as they burn out.  Set up your furniture so you have a clear path. Avoid moving your furniture around.  If any of your floors are uneven, fix them.  If there are any pets around you, be aware of where they are.  Review your medicines with your doctor. Some medicines can make you feel dizzy. This can increase your chance of falling. Ask your doctor what other things that you can do to help prevent falls. This information is not intended to replace advice given to you by your health care provider. Make sure you discuss any questions you have with your health care provider. Document Released: 02/24/2009 Document Revised: 10/06/2015 Document Reviewed: 06/04/2014 Elsevier Interactive Patient Education  2017 [employer].

## 2016-12-12 NOTE — Progress Notes (Signed)
Subjective:   Katie Singh is a 66 y.o. female who presents for Medicare Annual (Subsequent) preventive examination.  Review of Systems:   Cardiac Risk Factors include: hypertension;dyslipidemia;obesity (BMI >30kg/m2);advanced age (>55men, >49 women)     Objective:     Vitals: BP 136/84 (BP Location: Left Arm, Patient Position: Sitting)   Pulse 79   Temp 97.7 F (36.5 C)   Resp 16   Ht 5\' 9"  (1.753 m)   Wt 289 lb 9.6 oz (131.4 kg)   BMI 42.77 kg/m   Body mass index is 42.77 kg/m.   Tobacco History  Smoking Status  . Never Smoker  Smokeless Tobacco  . Never Used     Counseling given: Not Answered   Past Medical History:  Diagnosis Date  . Anemia    history of  . Chronic kidney disease    stage 3-4  . Complication of anesthesia   . Diabetes mellitus without complication (HCC)    diet controlled  . GERD (gastroesophageal reflux disease)   . Hepatitis    fatty live  . History of kidney stones   . Hyperlipidemia    history of, no longer taking meds.  . Hypertension   . Klebsiella infection   . Migraine headache with aura    history of  . MSSA (methicillin susceptible Staphylococcus aureus)   . Myelodysplasia   . OA (osteoarthritis)   . PONV (postoperative nausea and vomiting)   . 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 Rheumatic fever in pediatric patient 1961   Past Surgical History:  Procedure Laterality Date  . CATARACT EXTRACTION Left   . DISTAL INTERPHALANGEAL JOINT FUSION Right 01/19/2016   Procedure: DISTAL INTERPHALANGEAL JOINT FUSION;  Surgeon: Hessie Knows, MD;  Location: [facility] ORS;  Service: Orthopedics;  Laterality: Right;  . ENDOMETRIAL ABLATION    . TUBAL LIGATION  1988   Family History  Problem Relation Age of Onset  . Cancer Father   . Diabetes Mother   . Heart disease Mother    History  Sexual Activity  . Sexual activity: No    Outpatient Encounter Prescriptions as of 12/12/2016  Medication Sig  . albuterol  (PROVENTIL HFA;VENTOLIN HFA) 108 (90 Base) MCG/ACT inhaler Inhale 2 puffs into the lungs every 6 (six) hours as needed for wheezing or shortness of breath.  Marland Kitchen aspirin EC 325 MG tablet Take 325 mg by mouth daily.  . diazepam (VALIUM) 5 MG tablet TAKE 1 TABLET BY MOUTH AS NEEDED FOR ANXIETY  . diltiazem (CARDIZEM CD) 180 MG 24 hr capsule TAKE 1 CAPSULE (180 MG TOTAL) BY MOUTH DAILY.  . fexofenadine (ALLEGRA) 180 MG tablet Take 1 tablet (180 mg total) by mouth daily. (Patient taking differently: Take 180 mg by mouth daily as needed. )  . fluticasone (FLONASE) 50 MCG/ACT nasal spray Place into the nose.  . furosemide (LASIX) 40 MG tablet Take 1 tablet (40 mg total) by mouth daily.  Marland Kitchen gabapentin (NEURONTIN) 300 MG capsule TAKE 1 CAPSULE (300 MG TOTAL) BY MOUTH 3 (THREE) TIMES DAILY.  . hydroxychloroquine (PLAQUENIL) 200 MG tablet Take 200 mg by mouth daily.  Marland Kitchen losartan (COZAAR) 100 MG tablet TAKE 1 TABLET (100 MG TOTAL) BY MOUTH DAILY.  . meloxicam (MOBIC) 15 MG tablet TAKE 1 TABLET (15 MG TOTAL) BY MOUTH DAILY.  Marland Kitchen PARoxetine (PAXIL) 40 MG tablet Take 1 tablet (40 mg total) by mouth every morning.  . traMADol (ULTRAM) 50 MG tablet TAKE 1 TABLET BY  MOUTH TWICE A DAY   No facility-administered encounter medications on file as of 12/12/2016.     Activities of Daily Living In your present state of health, do you have any difficulty performing the following activities: 12/12/2016 01/19/2016  Hearing? N N  Vision? N N  Difficulty concentrating or making decisions? Y N  Walking or climbing stairs? N N  Dressing or bathing? N N  Doing errands, shopping? N -  Preparing Food and eating ? N -  Using the Toilet? N -  In the past six months, have you accidently leaked urine? Y -  Comment wears pads -  Do you have problems with loss of bowel control? N -  Managing your Medications? N -  Managing your Finances? N -  Housekeeping or managing your Housekeeping? N -  Some recent data might be hidden     Patient Care Team: Guadalupe Maple, MD as PCP - General (Family Medicine) Magnus Sinning, MD as Referring Physician (Nephrology) Emmaline Kluver., MD (Rheumatology)    Assessment:     Exercise Activities and Dietary recommendations Current Exercise Habits: The patient does not participate in regular exercise at present  Goals    . Increase water intake          Recommend drinking at least 2-3 glasses of water a day       Fall Risk Fall Risk  12/12/2016 04/26/2015  Falls in the past year? Yes No  Number falls in past yr: 2 or more -  Injury with Fall? No -  Follow up Falls prevention discussed -   Depression Screen PHQ 2/9 Scores 12/12/2016 04/26/2015  PHQ - 2 Score 2 1  PHQ- 9 Score 8 -     Cognitive Function     6CIT Screen 12/12/2016  What Year? 0 points  What month? 0 points  What time? 0 points  Count back from 20 0 points  Months in reverse 0 points  Repeat phrase 0 points  Total Score 0    Immunization History  Administered Date(s) Administered  . Influenza-Unspecified 02/12/2014, 03/22/2015  . Pneumococcal Conjugate-13 04/26/2015  . Pneumococcal Polysaccharide-23 08/01/2016  . Pneumococcal-Unspecified 07/19/2005, 02/19/2007  . Tdap 09/23/2013  . Zoster 08/06/2011   Screening Tests Health Maintenance  Topic Date Due  . INFLUENZA VACCINE  12/12/2016  . OPHTHALMOLOGY EXAM  01/12/2017 (Originally 10/19/2016)  . MAMMOGRAM  04/13/2017 (Originally 07/08/2011)  . HEMOGLOBIN A1C  02/01/2017  . FOOT EXAM  08/01/2017  . COLONOSCOPY  02/21/2020  . TETANUS/TDAP  09/24/2023  . DEXA SCAN  Completed  . Hepatitis C Screening  Completed  . PNA vac Low Risk Adult  Completed      Plan:     I have personally reviewed and addressed the Medicare Annual Wellness questionnaire and have noted the following in the patient's chart:  A. Medical and social history B. Use of alcohol, tobacco or illicit drugs  C. Current medications and  supplements D. Functional ability and status E.  Nutritional status F.  Physical activity G. Advance directives H. List of other physicians I.  Hospitalizations, surgeries, and ER visits in previous 12 months J.  [place] such as hearing and vision if needed, cognitive and depression L. Referrals and appointments   In addition, I have reviewed and discussed with patient certain preventive protocols, quality metrics, and best practice recommendations. A written personalized care plan for preventive services as well as general preventive health recommendations were provided to patient.  Signed,  Tyler Aas, LPN Nurse Health Advisor   MD Recommendations:

## 2016-12-13 ENCOUNTER — Other Ambulatory Visit: Payer: Self-pay | Admitting: Family Medicine

## 2016-12-18 ENCOUNTER — Encounter: Payer: Self-pay | Admitting: Family Medicine

## 2017-01-02 ENCOUNTER — Encounter: Payer: Self-pay | Admitting: Family Medicine

## 2017-01-07 ENCOUNTER — Other Ambulatory Visit: Payer: Self-pay | Admitting: Family Medicine

## 2017-01-07 NOTE — Telephone Encounter (Signed)
Last OV: 08/01/16 Next OV: 02/28/17  BMP Latest Ref Rng & Units 08/01/2016 12/14/2015 11/16/2015  Glucose 65 - 99 mg/dL 146(H) 152(H) 94  BUN 8 - 27 mg/dL 27 24 23   Creatinine 0.57 - 1.00 mg/dL 1.59(H) 1.64(H) 1.75(H)  BUN/Creat Ratio 12 - 28 17 15 13   Sodium 134 - 144 mmol/L 139 138 140  Potassium 3.5 - 5.2 mmol/L 4.4 4.1 4.1  Chloride 96 - 106 mmol/L 101 100 99  CO2 18 - 29 mmol/L 20 21 22   Calcium 8.7 - 10.3 mg/dL 9.2 8.7 9.2

## 2017-02-06 ENCOUNTER — Other Ambulatory Visit: Payer: Self-pay | Admitting: Family Medicine

## 2017-02-06 NOTE — Telephone Encounter (Signed)
Call pt 

## 2017-02-07 NOTE — Telephone Encounter (Signed)
Call pt 

## 2017-02-10 ENCOUNTER — Other Ambulatory Visit: Payer: Self-pay | Admitting: Family Medicine

## 2017-02-26 ENCOUNTER — Other Ambulatory Visit: Payer: Self-pay | Admitting: Family Medicine

## 2017-02-28 ENCOUNTER — Encounter: Payer: Self-pay | Admitting: Family Medicine

## 2017-03-08 ENCOUNTER — Ambulatory Visit (INDEPENDENT_AMBULATORY_CARE_PROVIDER_SITE_OTHER): Payer: PPO | Admitting: Family Medicine

## 2017-03-08 ENCOUNTER — Encounter: Payer: Self-pay | Admitting: Family Medicine

## 2017-03-08 VITALS — BP 127/81 | HR 88 | Wt 298.0 lb

## 2017-03-08 DIAGNOSIS — Z23 Encounter for immunization: Secondary | ICD-10-CM

## 2017-03-08 DIAGNOSIS — G4730 Sleep apnea, unspecified: Secondary | ICD-10-CM

## 2017-03-08 DIAGNOSIS — Z7189 Other specified counseling: Secondary | ICD-10-CM

## 2017-03-08 DIAGNOSIS — E1122 Type 2 diabetes mellitus with diabetic chronic kidney disease: Secondary | ICD-10-CM

## 2017-03-08 DIAGNOSIS — N184 Chronic kidney disease, stage 4 (severe): Secondary | ICD-10-CM

## 2017-03-08 DIAGNOSIS — Z0000 Encounter for general adult medical examination without abnormal findings: Secondary | ICD-10-CM

## 2017-03-08 DIAGNOSIS — E7800 Pure hypercholesterolemia, unspecified: Secondary | ICD-10-CM

## 2017-03-08 DIAGNOSIS — N2889 Other specified disorders of kidney and ureter: Secondary | ICD-10-CM

## 2017-03-08 DIAGNOSIS — N183 Chronic kidney disease, stage 3 unspecified: Secondary | ICD-10-CM

## 2017-03-08 DIAGNOSIS — I10 Essential (primary) hypertension: Secondary | ICD-10-CM | POA: Diagnosis not present

## 2017-03-08 DIAGNOSIS — Z1329 Encounter for screening for other suspected endocrine disorder: Secondary | ICD-10-CM

## 2017-03-08 DIAGNOSIS — E1142 Type 2 diabetes mellitus with diabetic polyneuropathy: Secondary | ICD-10-CM | POA: Diagnosis not present

## 2017-03-08 DIAGNOSIS — F419 Anxiety disorder, unspecified: Secondary | ICD-10-CM | POA: Insufficient documentation

## 2017-03-08 LAB — MICROSCOPIC EXAMINATION: Epithelial Cells (non renal): >10 /hpf — AB (ref 0–10)

## 2017-03-08 LAB — URINALYSIS, ROUTINE W REFLEX MICROSCOPIC
Bilirubin, UA: NEGATIVE
GLUCOSE, UA: NEGATIVE
Ketones, UA: NEGATIVE
NITRITE UA: NEGATIVE
PH UA: 5 (ref 5.0–7.5)
Protein, UA: NEGATIVE
Specific Gravity, UA: 1.025 (ref 1.005–1.030)
Urobilinogen, Ur: 1 mg/dL (ref 0.2–1.0)

## 2017-03-08 LAB — BAYER DCA HB A1C WAIVED: HB A1C: 7.1 % — AB (ref <7.0)

## 2017-03-08 MED ORDER — FUROSEMIDE 40 MG PO TABS: 40.0000 mg | ORAL_TABLET | Freq: Every day | ORAL | 4 refills | Status: DC

## 2017-03-08 MED ORDER — PAROXETINE HCL 40 MG PO TABS: 40.0000 mg | ORAL_TABLET | ORAL | 4 refills | Status: DC

## 2017-03-08 MED ORDER — LOSARTAN POTASSIUM 100 MG PO TABS: 100.0000 mg | ORAL_TABLET | Freq: Every day | ORAL | 4 refills | Status: DC

## 2017-03-08 MED ORDER — DIAZEPAM 5 MG PO TABS: 5.0000 mg | ORAL_TABLET | Freq: Every day | ORAL | 0 refills | Status: DC | PRN

## 2017-03-08 MED ORDER — DILTIAZEM HCL ER COATED BEADS 180 MG PO CP24: 180.0000 mg | ORAL_CAPSULE | Freq: Every day | ORAL | 4 refills | Status: DC

## 2017-03-08 MED ORDER — GABAPENTIN 300 MG PO CAPS: 300.0000 mg | ORAL_CAPSULE | Freq: Four times a day (QID) | ORAL | 4 refills | Status: DC

## 2017-03-08 NOTE — Assessment & Plan Note (Signed)
The current medical regimen is effective;  continue present plan and medications.  

## 2017-03-08 NOTE — Progress Notes (Addendum)
BP 127/81   Pulse 88   Wt 298 lb (135.2 kg)   SpO2 99%   BMI 44.01 kg/m    Subjective:    Patient ID: Katie Singh, female    DOB: January 28, 1951, 66 y.o.   MRN: [id number]  HPI: Katie Singh is a 66 y.o. female  Annual exam Patient with concerned about fatigue and weight gain and continued arthralgias. Patient working with rheumatology and taking Plaquenil and low-dose. Has Valium and tramadol on her medicine list but it's not really taking if so very sparingly. Encouraged very sparing to know use. Blood pressure doing okay Taking gabapentin for arthralgias complaints seems to help some discuss may be taken the next her 14 worsening pain. Depression stable on Paxil. Not using CPAP and is adamant about not using CPAP Relevant past medical, surgical, family and social history reviewed and updated as indicated. Interim medical history since our last visit reviewed. Allergies and medications reviewed and updated.  Review of Systems  Constitutional: Negative.   HENT: Negative.   Eyes: Negative.   Respiratory: Negative.   Cardiovascular: Negative.   Gastrointestinal: Negative.        Has some reflux symptoms not using any over-the-counter medications which were discussed.  Endocrine: Negative.   Genitourinary: Negative.   Musculoskeletal: Negative.   Skin: Negative.   Allergic/Immunologic: Negative.   Neurological: Negative.   Hematological: Negative.   Psychiatric/Behavioral: Negative.     Per HPI unless specifically indicated above     Objective:    BP 127/81   Pulse 88   Wt 298 lb (135.2 kg)   SpO2 99%   BMI 44.01 kg/m   Wt Readings from Last 3 Encounters:  03/08/17 298 lb (135.2 kg)  12/12/16 289 lb 9.6 oz (131.4 kg)  08/01/16 280 lb (127 kg)    Physical Exam  Constitutional: She is oriented to person, place, and time. She appears well-developed and well-nourished.  HENT:  Head: Normocephalic and atraumatic.  Right Ear: External ear normal.  Left Ear:  External ear normal.  Nose: Nose normal.  Mouth/Throat: Oropharynx is clear and moist.  Eyes: Pupils are equal, round, and reactive to light. Conjunctivae and EOM are normal.  Neck: Normal range of motion. Neck supple. Carotid bruit is not present.  Cardiovascular: Normal rate, regular rhythm and normal heart sounds.   No murmur heard. Pulmonary/Chest: Effort normal and breath sounds normal. She exhibits no mass. Right breast exhibits no mass, no skin change and no tenderness. Left breast exhibits no mass, no skin change and no tenderness. Breasts are symmetrical.  Abdominal: Soft. Bowel sounds are normal. There is no hepatosplenomegaly.  Musculoskeletal: Normal range of motion.  Neurological: She is alert and oriented to person, place, and time.  Skin: No rash noted.  Psychiatric: She has a normal mood and affect. Her behavior is normal. Judgment and thought content normal.    Results for orders placed or performed in visit on 40/98/11  Basic metabolic panel  Result Value Ref Range   Glucose 146 (H) 65 - 99 mg/dL   BUN 27 8 - 27 mg/dL   Creatinine, Ser 1.59 (H) 0.57 - 1.00 mg/dL   GFR calc non Af Amer 34 (L) >59 mL/min/1.73   GFR calc Af Amer 39 (L) >59 mL/min/1.73   BUN/Creatinine Ratio 17 12 - 28   Sodium 139 134 - 144 mmol/L   Potassium 4.4 3.5 - 5.2 mmol/L   Chloride 101 96 - 106 mmol/L   CO2 20  18 - 29 mmol/L   Calcium 9.2 8.7 - 10.3 mg/dL  LP+ALT+AST Piccolo, Waived  Result Value Ref Range   ALT (SGPT) Piccolo, Waived 22 10 - 47 U/L   AST (SGOT) Piccolo, Waived 25 11 - 38 U/L   Cholesterol Piccolo, Waived 145 <200 mg/dL   HDL Chol Piccolo, Waived 41 (L) >59 mg/dL   Triglycerides Piccolo,Waived 128 <150 mg/dL   Chol/HDL Ratio Piccolo,Waive 3.6 mg/dL   LDL Chol Calc Piccolo Waived 79 <100 mg/dL   VLDL Chol Calc Piccolo,Waive 26 <30 mg/dL  Hemoglobin A1c  Result Value Ref Range   Hgb A1c MFr Bld 7.0 (H) 4.8 - 5.6 %   Est. average glucose Bld gHb Est-mCnc 154 mg/dL        Assessment & Plan:   Problem List Items Addressed This Visit      Cardiovascular and Mediastinum   Hypertension    The current medical regimen is effective;  continue present plan and medications.       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      Relevant Medications   losartan (COZAAR) 100 MG tablet   Other Relevant Orders   CBC with Differential/Platelet   Comprehensive metabolic panel   Urinalysis, Routine w reflex microscopic   Bayer DCA Hb A1c Waived     Genitourinary   Chronic renal insufficiency, stage III (moderate) (HCC)   Relevant Medications   gabapentin (NEURONTIN) 300 MG capsule   Other Relevant Orders   Comprehensive metabolic panel     Other   Hyperlipidemia    The current medical regimen is effective;  continue present plan and medications.       Relevant Medications   diltiazem (CARDIZEM CD) 180 MG 24 hr capsule   furosemide (LASIX) 40 MG tablet   losartan (COZAAR) 100 MG tablet   Other Relevant Orders   Lipid panel   Bayer DCA Hb A1c Waived   Anxiety    Discuss anxiety limitations of use of medicines and limiting use of Valium will give patient a refill of Valium 5 mg #20 with no refills Patient's mother is in the hospital with a guarded prognosis.      Relevant Medications   PARoxetine (PAXIL) 40 MG tablet   diazepam (VALIUM) 5 MG tablet   Advanced care planning/counseling discussion    A voluntary discussion about advance care planning including the explanation and discussion of advance directives was extensively discussed  with the patient.  Explanation  about the health care proxy and Living will was reviewed and packet with forms with explanation of how to fill them out was given.         Other Visit Diagnoses    Needs flu shot    -  Primary   Relevant Orders   Flu vaccine HIGH DOSE PF (Fluzone High dose) (Completed)   Thyroid disorder screen       Relevant Orders   TSH   PE (physical exam), annual  Follow up plan: Return in about 3 months (around 06/08/2017) for Hemoglobin A1c.

## 2017-03-08 NOTE — Assessment & Plan Note (Signed)
Not going to use CPAP

## 2017-03-08 NOTE — Assessment & Plan Note (Signed)
Discuss anxiety limitations of use of medicines and limiting use of Valium will give patient a refill of Valium 5 mg #20 with no refills Patient's mother is in the hospital with a guarded prognosis.

## 2017-03-08 NOTE — Patient Instructions (Signed)

## 2017-03-08 NOTE — Assessment & Plan Note (Signed)
A voluntary discussion about advance care planning including the explanation and discussion of advance directives was extensively discussed  with the patient.  Explanation about the health care proxy and Living will was reviewed and packet with forms with explanation of how to fill them out was given.    

## 2017-03-08 NOTE — Assessment & Plan Note (Signed)
Discuss worsening control of diabetes encourage weight loss for better control.

## 2017-03-09 LAB — COMPREHENSIVE METABOLIC PANEL
ALBUMIN: 4.4 g/dL (ref 3.6–4.8)
ALT: 15 IU/L (ref 0–32)
AST: 18 IU/L (ref 0–40)
Albumin/Globulin Ratio: 1.5 (ref 1.2–2.2)
Alkaline Phosphatase: 139 IU/L — ABNORMAL HIGH (ref 39–117)
BUN/Creatinine Ratio: 14 (ref 12–28)
BUN: 36 mg/dL — AB (ref 8–27)
Bilirubin Total: 0.4 mg/dL (ref 0.0–1.2)
CALCIUM: 8.9 mg/dL (ref 8.7–10.3)
CHLORIDE: 102 mmol/L (ref 96–106)
CO2: 18 mmol/L — AB (ref 20–29)
Creatinine, Ser: 2.64 mg/dL — ABNORMAL HIGH (ref 0.57–1.00)
GFR, EST AFRICAN AMERICAN: 21 mL/min/{1.73_m2} — AB (ref >59)
GFR, EST NON AFRICAN AMERICAN: 18 mL/min/{1.73_m2} — AB (ref >59)
GLOBULIN, TOTAL: 3 g/dL (ref 1.5–4.5)
Glucose: 183 mg/dL — ABNORMAL HIGH (ref 65–99)
Potassium: 4 mmol/L (ref 3.5–5.2)
Sodium: 139 mmol/L (ref 134–144)
Total Protein: 7.4 g/dL (ref 6.0–8.5)

## 2017-03-09 LAB — CBC WITH DIFFERENTIAL/PLATELET
Basophils Absolute: 0 10*3/uL (ref 0.0–0.2)
Basos: 1 %
EOS (ABSOLUTE): 0.2 10*3/uL (ref 0.0–0.4)
EOS: 5 %
HEMATOCRIT: 35.6 % (ref 34.0–46.6)
HEMOGLOBIN: 11.3 g/dL (ref 11.1–15.9)
IMMATURE GRANS (ABS): 0 10*3/uL (ref 0.0–0.1)
IMMATURE GRANULOCYTES: 0 %
LYMPHS: 21 %
Lymphocytes Absolute: 0.9 10*3/uL (ref 0.7–3.1)
MCH: 26.5 pg — ABNORMAL LOW (ref 26.6–33.0)
MCHC: 31.7 g/dL (ref 31.5–35.7)
MCV: 84 fL (ref 79–97)
MONOCYTES: 8 %
Monocytes Absolute: 0.3 10*3/uL (ref 0.1–0.9)
NEUTROS PCT: 65 %
Neutrophils Absolute: 2.8 10*3/uL (ref 1.4–7.0)
Platelets: 282 10*3/uL (ref 150–379)
RBC: 4.26 x10E6/uL (ref 3.77–5.28)
RDW: 14.2 % (ref 12.3–15.4)
WBC: 4.3 10*3/uL (ref 3.4–10.8)

## 2017-03-09 LAB — LIPID PANEL
Chol/HDL Ratio: 3.9 ratio (ref 0.0–4.4)
Cholesterol, Total: 151 mg/dL (ref 100–199)
HDL: 39 mg/dL — AB (ref >39)
LDL Calculated: 85 mg/dL (ref 0–99)
Triglycerides: 136 mg/dL (ref 0–149)
VLDL CHOLESTEROL CAL: 27 mg/dL (ref 5–40)

## 2017-03-09 LAB — TSH: TSH: 4.88 u[IU]/mL — AB (ref 0.450–4.500)

## 2017-03-13 ENCOUNTER — Telehealth: Payer: Self-pay | Admitting: Family Medicine

## 2017-03-13 DIAGNOSIS — N184 Chronic kidney disease, stage 4 (severe): Secondary | ICD-10-CM

## 2017-03-13 MED ORDER — CIPROFLOXACIN HCL 250 MG PO TABS: 250.0000 mg | ORAL_TABLET | Freq: Two times a day (BID) | ORAL | 0 refills | Status: DC

## 2017-03-13 NOTE — Telephone Encounter (Signed)
Discussion with patient. Patient come by the office to discuss blood work because of concerns reviewed with patient renal failure significantly worse patient's been taking meloxicam and aspirin. Reviewed patient not to take aspirin or meloxicam. Patient reluctant because of arthritis will flare. Reviewed Tylenol hot compresses cold if necessary. Recheck BMP 1 week. Also discuss TSH elevated will recheck TSH in a couple of months.

## 2017-03-26 ENCOUNTER — Other Ambulatory Visit: Payer: Self-pay | Admitting: Family Medicine

## 2017-04-02 DIAGNOSIS — I10 Essential (primary) hypertension: Secondary | ICD-10-CM | POA: Diagnosis not present

## 2017-04-02 DIAGNOSIS — N183 Chronic kidney disease, stage 3 (moderate): Secondary | ICD-10-CM | POA: Diagnosis not present

## 2017-04-02 DIAGNOSIS — N179 Acute kidney failure, unspecified: Secondary | ICD-10-CM | POA: Diagnosis not present

## 2017-04-29 DIAGNOSIS — E1129 Type 2 diabetes mellitus with other diabetic kidney complication: Secondary | ICD-10-CM | POA: Diagnosis not present

## 2017-04-29 DIAGNOSIS — N179 Acute kidney failure, unspecified: Secondary | ICD-10-CM | POA: Diagnosis not present

## 2017-04-29 DIAGNOSIS — N183 Chronic kidney disease, stage 3 (moderate): Secondary | ICD-10-CM | POA: Diagnosis not present

## 2017-04-29 DIAGNOSIS — I10 Essential (primary) hypertension: Secondary | ICD-10-CM | POA: Diagnosis not present

## 2017-05-20 ENCOUNTER — Telehealth: Payer: Self-pay | Admitting: Family Medicine

## 2017-05-20 MED ORDER — CIPROFLOXACIN HCL 250 MG PO TABS: 250.0000 mg | ORAL_TABLET | Freq: Two times a day (BID) | ORAL | 0 refills | Status: DC

## 2017-05-20 NOTE — Telephone Encounter (Signed)
Copied from [place]. Topic: Quick Communication - See Telephone Encounter >> May 20, 2017 12:20 PM Hewitt Shorts wrote: CRM for notification. See Telephone encounter for:  Pt is experiencing symptoms of a bladder infection and she states that she has an appt on .06/10/17 but she  would like to know if Dr. Jeananne Rama could call something in for the bladder  Infection best number

## 2017-05-20 NOTE — Telephone Encounter (Signed)
rx sent in 

## 2017-05-20 NOTE — Telephone Encounter (Signed)
Should patient come in for acute visit? Please advise.

## 2017-05-21 NOTE — Telephone Encounter (Signed)
Message relayed to patient. Verbalized understanding and denied questions.   

## 2017-06-10 ENCOUNTER — Encounter: Payer: Self-pay | Admitting: Family Medicine

## 2017-06-10 ENCOUNTER — Ambulatory Visit (INDEPENDENT_AMBULATORY_CARE_PROVIDER_SITE_OTHER): Payer: PPO | Admitting: Family Medicine

## 2017-06-10 VITALS — BP 127/82 | HR 66 | Wt 270.0 lb

## 2017-06-10 DIAGNOSIS — E1122 Type 2 diabetes mellitus with diabetic chronic kidney disease: Secondary | ICD-10-CM

## 2017-06-10 DIAGNOSIS — I10 Essential (primary) hypertension: Secondary | ICD-10-CM

## 2017-06-10 DIAGNOSIS — R7989 Other specified abnormal findings of blood chemistry: Secondary | ICD-10-CM

## 2017-06-10 DIAGNOSIS — F329 Major depressive disorder, single episode, unspecified: Secondary | ICD-10-CM

## 2017-06-10 DIAGNOSIS — N184 Chronic kidney disease, stage 4 (severe): Secondary | ICD-10-CM | POA: Diagnosis not present

## 2017-06-10 DIAGNOSIS — F341 Dysthymic disorder: Secondary | ICD-10-CM | POA: Diagnosis not present

## 2017-06-10 DIAGNOSIS — F419 Anxiety disorder, unspecified: Secondary | ICD-10-CM

## 2017-06-10 LAB — BAYER DCA HB A1C WAIVED: HB A1C (BAYER DCA - WAIVED): 6.7 %

## 2017-06-10 MED ORDER — ALBUTEROL SULFATE HFA 108 (90 BASE) MCG/ACT IN AERS: 2.0000 | INHALATION_SPRAY | Freq: Four times a day (QID) | RESPIRATORY_TRACT | 0 refills | Status: DC | PRN

## 2017-06-10 MED ORDER — TRAMADOL HCL 50 MG PO TABS: 50.0000 mg | ORAL_TABLET | Freq: Two times a day (BID) | ORAL | 0 refills | Status: DC

## 2017-06-10 MED ORDER — FLUTICASONE PROPIONATE 50 MCG/ACT NA SUSP: 2.0000 | Freq: Every day | NASAL | 12 refills | Status: DC

## 2017-06-10 MED ORDER — DIAZEPAM 5 MG PO TABS: 5.0000 mg | ORAL_TABLET | Freq: Every day | ORAL | 0 refills | Status: DC | PRN

## 2017-06-10 NOTE — Progress Notes (Signed)
BP 127/82   Pulse 66   Wt 270 lb (122.5 kg)   SpO2 98%   BMI 39.87 kg/m    Subjective:    Patient ID: Katie Singh, female    DOB: 01-21-1951, 67 y.o.   MRN: [id number]  HPI: Katie Singh is a 67 y.o. female  Chief Complaint  Patient presents with  . Follow-up  . Hypertension  . Diabetes   All in all doing well diabetes good control no issues has lost 28 pounds this fall and winter.  Has really cut out snacks and doing well no low blood sugar spells. Blood pressure also doing very well.  Without problems Allergies wants some refill on Flonase and anxiety has takes rare Valium 5 mg will give refill. Patient also has uses tramadol from time to time for occasional pain will give refill also. 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 Relevant past medical, surgical, family and social history reviewed and updated as indicated. Interim medical history since our last visit reviewed. Allergies and medications reviewed and updated.  Review of Systems  Constitutional: Negative.   Respiratory: Negative.   Cardiovascular: Negative.     Per HPI unless specifically indicated above     Objective:    BP 127/82   Pulse 66   Wt 270 lb (122.5 kg)   SpO2 98%   BMI 39.87 kg/m   Wt Readings from Last 3 Encounters:  06/10/17 270 lb (122.5 kg)  03/08/17 298 lb (135.2 kg)  12/12/16 289 lb 9.6 oz (131.4 kg)    Physical Exam  Constitutional: She is oriented to person, place, and time. She appears well-developed and well-nourished.  HENT:  Head: Normocephalic and atraumatic.  Eyes: Conjunctivae and EOM are normal.  Neck: Normal range of motion.  Cardiovascular: Normal rate, regular rhythm and normal heart sounds.  Pulmonary/Chest: Effort normal and breath sounds normal.  Musculoskeletal: Normal range of motion.  Neurological: She is alert and oriented to person, place, and time.  Skin: No erythema.  Psychiatric: She has a normal mood and affect.  Her behavior is normal. Judgment and thought content normal.    Results for orders placed or performed in visit on 04/15/17  HM DIABETES EYE EXAM  Result Value Ref Range   HM Diabetic Eye Exam No Retinopathy No Retinopathy      Assessment & Plan:   Problem List Items Addressed This Visit      Cardiovascular and Mediastinum   Hypertension - Primary    The current medical regimen is effective;  continue present plan and medications.       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       Relevant Orders   Bayer DCA Hb A1c Waived   Basic metabolic panel     Other   Major depression, chronic    The current medical regimen is effective;  continue present plan and medications.       Relevant Medications   diazepam (VALIUM) 5 MG tablet   Anxiety    The current medical regimen is effective;  continue present plan and medications.       Relevant Medications   diazepam (VALIUM) 5 MG tablet    Other Visit Diagnoses    Elevated TSH  Relevant Orders   TSH       Follow up plan: Return in about 3 months (around 09/08/2017) for Hemoglobin A1c, BMP,  Lipids, ALT, AST.

## 2017-06-10 NOTE — Assessment & Plan Note (Signed)
The current medical regimen is effective;  continue present plan and medications.  

## 2017-06-11 ENCOUNTER — Telehealth: Payer: Self-pay | Admitting: Family Medicine

## 2017-06-11 DIAGNOSIS — R7989 Other specified abnormal findings of blood chemistry: Secondary | ICD-10-CM

## 2017-06-11 LAB — BASIC METABOLIC PANEL
BUN/Creatinine Ratio: 13 (ref 12–28)
BUN: 22 mg/dL (ref 8–27)
CALCIUM: 9.1 mg/dL (ref 8.7–10.3)
CHLORIDE: 102 mmol/L (ref 96–106)
CO2: 21 mmol/L (ref 20–29)
CREATININE: 1.75 mg/dL — AB (ref 0.57–1.00)
GFR calc Af Amer: 34 mL/min/{1.73_m2} — ABNORMAL LOW (ref >59)
GFR calc non Af Amer: 30 mL/min/{1.73_m2} — ABNORMAL LOW (ref >59)
GLUCOSE: 149 mg/dL — AB (ref 65–99)
Potassium: 3.9 mmol/L (ref 3.5–5.2)
Sodium: 141 mmol/L (ref 134–144)

## 2017-06-11 LAB — TSH: TSH: 5.13 u[IU]/mL — AB (ref 0.450–4.500)

## 2017-06-11 NOTE — Telephone Encounter (Signed)
Phone call Discussed with patient CKD improved.  Reviewed patient remaining cautious with medications etc. TSH more elevated with thyroid history will recheck again in 3 months

## 2017-06-11 NOTE — Telephone Encounter (Signed)
-----   Message from Katie Singh, [place] sent at 06/11/2017 11:10 AM EST ----- Patient was transferred to provider for telephone conversation.

## 2017-06-11 NOTE — Telephone Encounter (Signed)
-----   Message from Amada Kingfisher, [place] sent at 06/11/2017 11:10 AM EST ----- Patient was transferred to provider for telephone conversation.

## 2017-07-23 DIAGNOSIS — R600 Localized edema: Secondary | ICD-10-CM | POA: Diagnosis not present

## 2017-07-23 DIAGNOSIS — E1129 Type 2 diabetes mellitus with other diabetic kidney complication: Secondary | ICD-10-CM | POA: Diagnosis not present

## 2017-07-23 DIAGNOSIS — E1122 Type 2 diabetes mellitus with diabetic chronic kidney disease: Secondary | ICD-10-CM | POA: Diagnosis not present

## 2017-07-23 DIAGNOSIS — I10 Essential (primary) hypertension: Secondary | ICD-10-CM | POA: Diagnosis not present

## 2017-07-23 DIAGNOSIS — N184 Chronic kidney disease, stage 4 (severe): Secondary | ICD-10-CM | POA: Diagnosis not present

## 2017-07-23 DIAGNOSIS — R601 Generalized edema: Secondary | ICD-10-CM | POA: Diagnosis not present

## 2017-07-23 LAB — BASIC METABOLIC PANEL
BUN: 20 (ref 4–21)
CREATININE: 1.5 — AB (ref 0.5–1.1)
GLUCOSE: 132
Potassium: 3.9 (ref 3.4–5.3)
Sodium: 143 (ref 137–147)

## 2017-08-15 ENCOUNTER — Other Ambulatory Visit: Payer: Self-pay | Admitting: Family Medicine

## 2017-09-02 ENCOUNTER — Ambulatory Visit (INDEPENDENT_AMBULATORY_CARE_PROVIDER_SITE_OTHER): Payer: PPO | Admitting: Family Medicine

## 2017-09-02 DIAGNOSIS — I10 Essential (primary) hypertension: Secondary | ICD-10-CM

## 2017-09-17 ENCOUNTER — Encounter: Payer: Self-pay | Admitting: Family Medicine

## 2017-09-17 ENCOUNTER — Ambulatory Visit (INDEPENDENT_AMBULATORY_CARE_PROVIDER_SITE_OTHER): Payer: PPO | Admitting: Family Medicine

## 2017-09-17 VITALS — BP 138/80 | HR 94 | Ht 69.0 in | Wt 275.6 lb

## 2017-09-17 DIAGNOSIS — F419 Anxiety disorder, unspecified: Secondary | ICD-10-CM | POA: Diagnosis not present

## 2017-09-17 DIAGNOSIS — N184 Chronic kidney disease, stage 4 (severe): Secondary | ICD-10-CM | POA: Diagnosis not present

## 2017-09-17 DIAGNOSIS — I10 Essential (primary) hypertension: Secondary | ICD-10-CM

## 2017-09-17 DIAGNOSIS — E782 Mixed hyperlipidemia: Secondary | ICD-10-CM | POA: Diagnosis not present

## 2017-09-17 DIAGNOSIS — M1990 Unspecified osteoarthritis, unspecified site: Secondary | ICD-10-CM | POA: Diagnosis not present

## 2017-09-17 DIAGNOSIS — E119 Type 2 diabetes mellitus without complications: Secondary | ICD-10-CM | POA: Diagnosis not present

## 2017-09-17 DIAGNOSIS — E1122 Type 2 diabetes mellitus with diabetic chronic kidney disease: Secondary | ICD-10-CM | POA: Diagnosis not present

## 2017-09-17 DIAGNOSIS — Z961 Presence of intraocular lens: Secondary | ICD-10-CM | POA: Diagnosis not present

## 2017-09-17 DIAGNOSIS — H35383 Toxic maculopathy, bilateral: Secondary | ICD-10-CM | POA: Diagnosis not present

## 2017-09-17 DIAGNOSIS — R7989 Other specified abnormal findings of blood chemistry: Secondary | ICD-10-CM | POA: Diagnosis not present

## 2017-09-17 LAB — LP+ALT+AST PICCOLO, WAIVED
ALT (SGPT) Piccolo, Waived: 24 U/L (ref 10–47)
AST (SGOT) Piccolo, Waived: 16 U/L (ref 11–38)
CHOL/HDL RATIO PICCOLO,WAIVE: 3.8 mg/dL
Cholesterol Piccolo, Waived: 151 mg/dL (ref <200)
HDL CHOL PICCOLO, WAIVED: 40 mg/dL — AB (ref >59)
LDL Chol Calc Piccolo Waived: 92 mg/dL (ref <100)
TRIGLYCERIDES PICCOLO,WAIVED: 96 mg/dL (ref <150)
VLDL CHOL CALC PICCOLO,WAIVE: 19 mg/dL (ref <30)

## 2017-09-17 LAB — HM DIABETES EYE EXAM

## 2017-09-17 LAB — BAYER DCA HB A1C WAIVED: HB A1C (BAYER DCA - WAIVED): 6.5 % (ref <7.0)

## 2017-09-17 MED ORDER — DIAZEPAM 5 MG PO TABS: 5.0000 mg | ORAL_TABLET | Freq: Every day | ORAL | 0 refills | Status: DC | PRN

## 2017-09-17 MED ORDER — TRAMADOL HCL 50 MG PO TABS: 50.0000 mg | ORAL_TABLET | Freq: Two times a day (BID) | ORAL | 0 refills | Status: DC

## 2017-09-17 NOTE — Assessment & Plan Note (Signed)
The current medical regimen is effective;  continue present plan and medications.  

## 2017-09-17 NOTE — Progress Notes (Signed)
BP 138/80 (BP Location: Left Arm)   Pulse 94   Ht 5\' 9"  (1.753 m)   Wt 275 lb 9.6 oz (125 kg)   SpO2 98%   BMI 40.70 kg/m    Subjective:    Patient ID: Katie Singh, female    DOB: 19-Jun-1950, 67 y.o.   MRN: [id number]  HPI: Katie Singh is a 67 y.o. female  Chief Complaint  Patient presents with  . Follow-up  . Hypertension  . Hyperlipidemia  . Diabetes   Patient follow-up all in all doing well Blood pressure doing well no complaints. Diabetes doing well diet controlled with not that good a diet.  But patient doing well anyway. Getting good reports from nephrology kidney and function improved slightly. Blood pressure good control no issues with medications. Cholesterol also doing well no complaints.  Relevant past medical, surgical, family and social history reviewed and updated as indicated. Interim medical history since our last visit reviewed. Allergies and medications reviewed and updated.  Review of Systems  Constitutional: Negative.   Respiratory: Negative.   Cardiovascular: Negative.     Per HPI unless specifically indicated above     Objective:    BP 138/80 (BP Location: Left Arm)   Pulse 94   Ht 5\' 9"  (1.753 m)   Wt 275 lb 9.6 oz (125 kg)   SpO2 98%   BMI 40.70 kg/m   Wt Readings from Last 3 Encounters:  09/17/17 275 lb 9.6 oz (125 kg)  06/10/17 270 lb (122.5 kg)  03/08/17 298 lb (135.2 kg)    Physical Exam  Constitutional: She is oriented to person, place, and time. She appears well-developed and well-nourished.  HENT:  Head: Normocephalic and atraumatic.  Eyes: Conjunctivae and EOM are normal.  Neck: Normal range of motion.  Cardiovascular: Normal rate, regular rhythm and normal heart sounds.  Pulmonary/Chest: Effort normal and breath sounds normal.  Musculoskeletal: Normal range of motion.  Neurological: She is alert and oriented to person, place, and time.  Skin: No erythema.  Psychiatric: She has a normal mood and affect. Her  behavior is normal. Judgment and thought content normal.    Results for orders placed or performed in visit on 64/33/29  Basic metabolic panel  Result Value Ref Range   Glucose 132    BUN 20 4 - 21   Creatinine 1.5 (A) 0.5 - 1.1   Potassium 3.9 3.4 - 5.3   Sodium 143 137 - 147      Assessment & Plan:   Problem List Items Addressed This Visit      Cardiovascular and Mediastinum   Hypertension - Primary    The current medical regimen is effective;  continue present plan and medications.       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       Relevant Orders   Basic metabolic panel   Bayer DCA Hb A1c Waived   LP+ALT+AST Piccolo, Waived     Musculoskeletal and Integument   Osteoarthritis   Relevant Medications   traMADol (ULTRAM) 50 MG tablet     Other   Hyperlipidemia    The current medical regimen is effective;  continue present plan and medications.       Relevant Orders  Basic metabolic panel   Bayer DCA Hb A1c Waived   LP+ALT+AST Piccolo, Waived   Anxiety    The current medical regimen is effective;  continue present plan and medications.       Relevant Medications   diazepam (VALIUM) 5 MG tablet    Other Visit Diagnoses    Elevated TSH       Relevant Orders   TSH       Follow up plan: Return in about 6 months (around 03/20/2018) for Physical Exam, Hemoglobin A1c.

## 2017-09-18 ENCOUNTER — Encounter: Payer: Self-pay | Admitting: Family Medicine

## 2017-09-18 LAB — BASIC METABOLIC PANEL
BUN / CREAT RATIO: 12 (ref 12–28)
BUN: 17 mg/dL (ref 8–27)
CO2: 25 mmol/L (ref 20–29)
CREATININE: 1.38 mg/dL — AB (ref 0.57–1.00)
Calcium: 9.2 mg/dL (ref 8.7–10.3)
Chloride: 100 mmol/L (ref 96–106)
GFR calc Af Amer: 46 mL/min/{1.73_m2} — ABNORMAL LOW (ref >59)
GFR, EST NON AFRICAN AMERICAN: 40 mL/min/{1.73_m2} — AB (ref >59)
GLUCOSE: 223 mg/dL — AB (ref 65–99)
Potassium: 3.8 mmol/L (ref 3.5–5.2)
SODIUM: 141 mmol/L (ref 134–144)

## 2017-09-18 LAB — TSH: TSH: 2.94 u[IU]/mL (ref 0.450–4.500)

## 2017-10-31 ENCOUNTER — Encounter: Payer: Self-pay | Admitting: Physician Assistant

## 2017-10-31 ENCOUNTER — Ambulatory Visit (INDEPENDENT_AMBULATORY_CARE_PROVIDER_SITE_OTHER): Payer: PPO | Admitting: Physician Assistant

## 2017-10-31 VITALS — BP 140/80 | HR 85 | Temp 98.3°F | Wt 282.0 lb

## 2017-10-31 DIAGNOSIS — R51 Headache: Secondary | ICD-10-CM | POA: Diagnosis not present

## 2017-10-31 DIAGNOSIS — M1990 Unspecified osteoarthritis, unspecified site: Secondary | ICD-10-CM | POA: Diagnosis not present

## 2017-10-31 DIAGNOSIS — R519 Headache, unspecified: Secondary | ICD-10-CM

## 2017-10-31 DIAGNOSIS — W57XXXA Bitten or stung by nonvenomous insect and other nonvenomous arthropods, initial encounter: Secondary | ICD-10-CM

## 2017-10-31 DIAGNOSIS — N184 Chronic kidney disease, stage 4 (severe): Secondary | ICD-10-CM | POA: Diagnosis not present

## 2017-10-31 MED ORDER — DOXYCYCLINE HYCLATE 100 MG PO TABS: 100.0000 mg | ORAL_TABLET | Freq: Two times a day (BID) | ORAL | 0 refills | Status: DC

## 2017-10-31 NOTE — Progress Notes (Signed)
Subjective:    Patient ID: Katie Singh, female    DOB: Nov 20, 1950, 67 y.o.   MRN: [id number]  Katie Singh is a 67 y.o. female presenting on 10/31/2017 for Nausea (Patient pulled tick off x 2 weeks, not experiencing stomach issues, fatigue, shaking, itchy, headaches, muscle/bone aches, "raw chest")   HPI   Global joint pain, muscle pain. Nauseated without vomiting No fevers. Endorses headaches. Feels like whole body has been itching. Has found tick on hip. Unknown duration of attachment. Found tick 2.5 weeks before now. Denies rash. On plaquenil for arthritis, but patient herself does not know what arthritis it is. Patient has CKD Stage IV. Has had "tick fever" before and said it felt like this. Tolerated doxycycline.  Social History   Tobacco Use  . Smoking status: Never Smoker  . Smokeless tobacco: Never Used  Substance Use Topics  . Alcohol use: No    Comment:  once every 2-3 years.  . Drug use: No    Review of Systems Per HPI unless specifically indicated above     Objective:    BP 140/80   Pulse 85   Temp 98.3 F (36.8 C) (Oral)   Wt 282 lb (127.9 kg)   SpO2 96%   BMI 41.64 kg/m   Wt Readings from Last 3 Encounters:  10/31/17 282 lb (127.9 kg)  09/17/17 275 lb 9.6 oz (125 kg)  06/10/17 270 lb (122.5 kg)    Physical Exam  Constitutional: She is oriented to person, place, and time. She appears well-developed and well-nourished.  Cardiovascular: Normal rate and regular rhythm.  Pulmonary/Chest: Effort normal and breath sounds normal.  Neurological: She is alert and oriented to person, place, and time.  Skin: Skin is warm and dry. No rash noted.  Psychiatric: She has a normal mood and affect. Her behavior is normal.   Results for orders placed or performed in visit on 76/16/07  Basic metabolic panel  Result Value Ref Range   Glucose 223 (H) 65 - 99 mg/dL   BUN 17 8 - 27 mg/dL   Creatinine, Ser 1.38 (H) 0.57 - 1.00 mg/dL   GFR calc non Af Amer 40 (L)  >59 mL/min/1.73   GFR calc Af Amer 46 (L) >59 mL/min/1.73   BUN/Creatinine Ratio 12 12 - 28   Sodium 141 134 - 144 mmol/L   Potassium 3.8 3.5 - 5.2 mmol/L   Chloride 100 96 - 106 mmol/L   CO2 25 20 - 29 mmol/L   Calcium 9.2 8.7 - 10.3 mg/dL  Bayer DCA Hb A1c Waived  Result Value Ref Range   HB A1C (BAYER DCA - WAIVED) 6.5 <7.0 %  LP+ALT+AST Piccolo, Waived  Result Value Ref Range   ALT (SGPT) Piccolo, Waived 24 10 - 47 U/L   AST (SGOT) Piccolo, Waived 16 11 - 38 U/L   Cholesterol Piccolo, Waived 151 <200 mg/dL   HDL Chol Piccolo, Waived 40 (L) >59 mg/dL   Triglycerides Piccolo,Waived 96 <150 mg/dL   Chol/HDL Ratio Piccolo,Waive 3.8 mg/dL   LDL Chol Calc Piccolo Waived 92 <100 mg/dL   VLDL Chol Calc Piccolo,Waive 19 <30 mg/dL  TSH  Result Value Ref Range   TSH 2.940 0.450 - 4.500 uIU/mL      Assessment & Plan:   1. Tick bite, initial encounter  Treat empirically. Declines titers today. No renal adjustment required.  - doxycycline (VIBRA-TABS) 100 MG tablet; Take 1 tablet (100 mg total) by mouth 2 (two) times daily.  Dispense: 20 tablet; Refill: 0  2. Nonintractable headache, unspecified chronicity pattern, unspecified headache type  - doxycycline (VIBRA-TABS) 100 MG tablet; Take 1 tablet (100 mg total) by mouth 2 (two) times daily.  Dispense: 20 tablet; Refill: 0  3. Arthritis  - doxycycline (VIBRA-TABS) 100 MG tablet; Take 1 tablet (100 mg total) by mouth 2 (two) times daily.  Dispense: 20 tablet; Refill: 0  4. CKD (chronic kidney disease) stage 4, GFR 15-29 ml/min (HCC)  GFR 45 on last check.     Follow up plan: Return if symptoms worsen or fail to improve.  Carles Collet, PA-C [facility] 10/31/2017, 4:40 PM

## 2017-10-31 NOTE — Patient Instructions (Signed)

## 2017-11-08 DIAGNOSIS — I10 Essential (primary) hypertension: Secondary | ICD-10-CM | POA: Diagnosis not present

## 2017-11-08 DIAGNOSIS — E1129 Type 2 diabetes mellitus with other diabetic kidney complication: Secondary | ICD-10-CM | POA: Diagnosis not present

## 2017-11-08 DIAGNOSIS — N183 Chronic kidney disease, stage 3 (moderate): Secondary | ICD-10-CM | POA: Diagnosis not present

## 2018-01-06 DIAGNOSIS — M150 Primary generalized (osteo)arthritis: Secondary | ICD-10-CM | POA: Diagnosis not present

## 2018-01-06 DIAGNOSIS — M7061 Trochanteric bursitis, right hip: Secondary | ICD-10-CM | POA: Diagnosis not present

## 2018-01-06 DIAGNOSIS — G8929 Other chronic pain: Secondary | ICD-10-CM | POA: Diagnosis not present

## 2018-01-06 DIAGNOSIS — M545 Low back pain: Secondary | ICD-10-CM | POA: Diagnosis not present

## 2018-01-06 DIAGNOSIS — M1811 Unilateral primary osteoarthritis of first carpometacarpal joint, right hand: Secondary | ICD-10-CM | POA: Diagnosis not present

## 2018-01-06 DIAGNOSIS — M1990 Unspecified osteoarthritis, unspecified site: Secondary | ICD-10-CM | POA: Diagnosis not present

## 2018-02-20 ENCOUNTER — Ambulatory Visit: Payer: PPO

## 2018-03-11 ENCOUNTER — Other Ambulatory Visit: Payer: Self-pay | Admitting: Family Medicine

## 2018-03-11 DIAGNOSIS — I10 Essential (primary) hypertension: Secondary | ICD-10-CM

## 2018-03-20 ENCOUNTER — Ambulatory Visit (INDEPENDENT_AMBULATORY_CARE_PROVIDER_SITE_OTHER): Payer: PPO | Admitting: Family Medicine

## 2018-03-20 ENCOUNTER — Encounter: Payer: Self-pay | Admitting: Family Medicine

## 2018-03-20 VITALS — BP 132/82 | HR 78 | Temp 97.8°F | Ht 67.24 in | Wt 287.2 lb

## 2018-03-20 DIAGNOSIS — Z7189 Other specified counseling: Secondary | ICD-10-CM

## 2018-03-20 DIAGNOSIS — Z23 Encounter for immunization: Secondary | ICD-10-CM | POA: Diagnosis not present

## 2018-03-20 DIAGNOSIS — E7800 Pure hypercholesterolemia, unspecified: Secondary | ICD-10-CM | POA: Diagnosis not present

## 2018-03-20 DIAGNOSIS — E1122 Type 2 diabetes mellitus with diabetic chronic kidney disease: Secondary | ICD-10-CM

## 2018-03-20 DIAGNOSIS — Z0000 Encounter for general adult medical examination without abnormal findings: Secondary | ICD-10-CM

## 2018-03-20 DIAGNOSIS — I10 Essential (primary) hypertension: Secondary | ICD-10-CM | POA: Diagnosis not present

## 2018-03-20 DIAGNOSIS — N183 Chronic kidney disease, stage 3 unspecified: Secondary | ICD-10-CM

## 2018-03-20 DIAGNOSIS — M1990 Unspecified osteoarthritis, unspecified site: Secondary | ICD-10-CM | POA: Diagnosis not present

## 2018-03-20 DIAGNOSIS — N184 Chronic kidney disease, stage 4 (severe): Secondary | ICD-10-CM | POA: Diagnosis not present

## 2018-03-20 DIAGNOSIS — Z1239 Encounter for other screening for malignant neoplasm of breast: Secondary | ICD-10-CM

## 2018-03-20 DIAGNOSIS — F419 Anxiety disorder, unspecified: Secondary | ICD-10-CM | POA: Diagnosis not present

## 2018-03-20 DIAGNOSIS — F329 Major depressive disorder, single episode, unspecified: Secondary | ICD-10-CM

## 2018-03-20 LAB — MICROSCOPIC EXAMINATION

## 2018-03-20 LAB — URINALYSIS, ROUTINE W REFLEX MICROSCOPIC
Bilirubin, UA: NEGATIVE
Glucose, UA: NEGATIVE
Ketones, UA: NEGATIVE
Nitrite, UA: NEGATIVE
PH UA: 5 (ref 5.0–7.5)
PROTEIN UA: NEGATIVE
RBC, UA: NEGATIVE
Specific Gravity, UA: <1.005 — ABNORMAL LOW (ref 1.005–1.030)
UUROB: 0.2 mg/dL (ref 0.2–1.0)

## 2018-03-20 LAB — BAYER DCA HB A1C WAIVED: HB A1C: 6.9 % (ref <7.0)

## 2018-03-20 MED ORDER — TRAMADOL HCL 50 MG PO TABS: 50.0000 mg | ORAL_TABLET | Freq: Two times a day (BID) | ORAL | 0 refills | Status: DC

## 2018-03-20 MED ORDER — FUROSEMIDE 40 MG PO TABS: 40.0000 mg | ORAL_TABLET | Freq: Every day | ORAL | 4 refills | Status: DC

## 2018-03-20 MED ORDER — DIAZEPAM 5 MG PO TABS: 5.0000 mg | ORAL_TABLET | Freq: Every day | ORAL | 0 refills | Status: DC | PRN

## 2018-03-20 MED ORDER — PAROXETINE HCL 40 MG PO TABS: 40.0000 mg | ORAL_TABLET | ORAL | 4 refills | Status: DC

## 2018-03-20 MED ORDER — DILTIAZEM HCL ER COATED BEADS 180 MG PO CP24: 180.0000 mg | ORAL_CAPSULE | Freq: Every day | ORAL | 4 refills | Status: DC

## 2018-03-20 MED ORDER — LOSARTAN POTASSIUM 100 MG PO TABS: 100.0000 mg | ORAL_TABLET | Freq: Every day | ORAL | 4 refills | Status: DC

## 2018-03-20 MED ORDER — GABAPENTIN 300 MG PO CAPS
300.0000 mg | ORAL_CAPSULE | Freq: Four times a day (QID) | ORAL | 4 refills | Status: DC
Start: 2018-03-20 — End: 2019-04-13

## 2018-03-20 NOTE — Assessment & Plan Note (Signed)
The current medical regimen is effective;  continue present plan and medications. a 

## 2018-03-20 NOTE — Progress Notes (Signed)
BP 132/82 (BP Location: Left Arm)   Pulse 78   Temp 97.8 F (36.6 C)   Ht 5' 7.24" (1.708 m)   Wt 287 lb 4 oz (130.3 kg)   SpO2 98%   BMI 44.66 kg/m    Subjective:    Patient ID: Katie Singh, female    DOB: 1950/11/29, 67 y.o.   MRN: [id number]  HPI: Katie Singh is a 67 y.o. female  Med check and yearly f/u  Patient about a month ago climbing some steps felt her right knee pop laterally and is been sore and tender throughout the leg on the lateral side ever since.  Occasional clicking no locking no giving way no joint swelling or redness has been noted. Patient has multiple arthropathies followed by Dr. Jefm Bryant. For leg pain has occasionally taking extra gabapentin at night which may be has helped some. Patient also follow-up hypertension doing well no complaints from medications taken faithfully. Taking gabapentin for leg pain which helps some.  Paxil for depression is stable.  Also taking Lasix for fluid retention and blood pressure which is stable. Takes Valium tramadol on a rare as needed basis 20 tablets given 6 months ago has held out and will do that again.  Relevant past medical, surgical, family and social history reviewed and updated as indicated. Interim medical history since our last visit reviewed. Allergies and medications reviewed and updated.  Review of Systems  Constitutional: Negative.   HENT: Negative.   Eyes: Negative.   Respiratory: Negative.   Cardiovascular: Negative.   Gastrointestinal: Negative.   Endocrine: Negative.   Genitourinary: Negative.   Musculoskeletal: Negative.   Skin: Negative.   Allergic/Immunologic: Negative.   Neurological: Negative.   Hematological: Negative.   Psychiatric/Behavioral: Negative.     Per HPI unless specifically indicated above     Objective:    BP 132/82 (BP Location: Left Arm)   Pulse 78   Temp 97.8 F (36.6 C)   Ht 5' 7.24" (1.708 m)   Wt 287 lb 4 oz (130.3 kg)   SpO2 98%   BMI 44.66 kg/m    Wt Readings from Last 3 Encounters:  03/20/18 287 lb 4 oz (130.3 kg)  10/31/17 282 lb (127.9 kg)  09/17/17 275 lb 9.6 oz (125 kg)    Physical Exam  Constitutional: She is oriented to person, place, and time. She appears well-developed and well-nourished.  HENT:  Head: Normocephalic and atraumatic.  Right Ear: External ear normal.  Left Ear: External ear normal.  Nose: Nose normal.  Mouth/Throat: Oropharynx is clear and moist.  Eyes: Pupils are equal, round, and reactive to light. Conjunctivae and EOM are normal.  Neck: Normal range of motion. Neck supple. Carotid bruit is not present.  Cardiovascular: Normal rate, regular rhythm and normal heart sounds.  No murmur heard. Pulmonary/Chest: Effort normal and breath sounds normal. She exhibits no mass. Right breast exhibits no mass, no skin change and no tenderness. Left breast exhibits no mass, no skin change and no tenderness. Breasts are symmetrical.  Abdominal: Soft. Bowel sounds are normal. There is no hepatosplenomegaly.  Musculoskeletal: Normal range of motion.  Neurological: She is alert and oriented to person, place, and time.  Skin: No rash noted.  Psychiatric: She has a normal mood and affect. Her behavior is normal. Judgment and thought content normal.    Results for orders placed or performed in visit on 02/40/97  Basic metabolic panel  Result Value Ref Range   Glucose 223 (H)  65 - 99 mg/dL   BUN 17 8 - 27 mg/dL   Creatinine, Ser 1.38 (H) 0.57 - 1.00 mg/dL   GFR calc non Af Amer 40 (L) >59 mL/min/1.73   GFR calc Af Amer 46 (L) >59 mL/min/1.73   BUN/Creatinine Ratio 12 12 - 28   Sodium 141 134 - 144 mmol/L   Potassium 3.8 3.5 - 5.2 mmol/L   Chloride 100 96 - 106 mmol/L   CO2 25 20 - 29 mmol/L   Calcium 9.2 8.7 - 10.3 mg/dL  Bayer DCA Hb A1c Waived  Result Value Ref Range   HB A1C (BAYER DCA - WAIVED) 6.5 <7.0 %  LP+ALT+AST Piccolo, Waived  Result Value Ref Range   ALT (SGPT) Piccolo, Waived 24 10 - 47 U/L   AST  (SGOT) Piccolo, Waived 16 11 - 38 U/L   Cholesterol Piccolo, Waived 151 <200 mg/dL   HDL Chol Piccolo, Waived 40 (L) >59 mg/dL   Triglycerides Piccolo,Waived 96 <150 mg/dL   Chol/HDL Ratio Piccolo,Waive 3.8 mg/dL   LDL Chol Calc Piccolo Waived 92 <100 mg/dL   VLDL Chol Calc Piccolo,Waive 19 <30 mg/dL  TSH  Result Value Ref Range   TSH 2.940 0.450 - 4.500 uIU/mL      Assessment & Plan:   Problem List Items Addressed This Visit      Cardiovascular and Mediastinum   Hypertension    The current medical regimen is effective;  continue present plan and medications. 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       Relevant Medications   losartan (COZAAR) 100 MG tablet   Other Relevant Orders   Lipid panel   TSH   Bayer DCA Hb A1c Waived     Musculoskeletal and Integument   Osteoarthritis   Relevant Medications   traMADol (ULTRAM) 50 MG tablet     Other   Hyperlipidemia    The current medical regimen is effective;  continue present plan and medications. a      Relevant Medications   losartan (COZAAR) 100 MG tablet   diltiazem (CARDIZEM CD) 180 MG 24 hr capsule   furosemide (LASIX) 40 MG tablet   Major depression, chronic    The current medical regimen is effective;  continue present plan and medications. a      Relevant Medications   PARoxetine (PAXIL) 40 MG tablet   diazepam (VALIUM) 5 MG tablet   Other Relevant Orders   TSH   Anxiety    Discussed anxiety care and treatment gave refill on Valium patient will continue limited use.      Relevant Medications   PARoxetine (PAXIL) 40 MG  tablet   diazepam (VALIUM) 5 MG tablet   Advanced care planning/counseling discussion    A voluntary discussion about advanced care planning including explanation and discussion of advanced directives was extentively discussed with the patient.  Explained about the healthcare proxy and living will was reviewed and packet with forms with expiration of how to fill them out was given.  Time spent: Encounter  16+ min individuals present: Patient       Other Visit Diagnoses    Immunization due    -  Primary   Relevant Orders   Flu vaccine HIGH DOSE PF (Fluzone High dose) (Completed)   Chronic renal insufficiency, stage III (moderate) (HCC)       Relevant Medications   gabapentin (NEURONTIN) 300 MG capsule   Other Relevant Orders   Comprehensive metabolic panel   CBC with Differential/Platelet   TSH   Urinalysis, Routine w reflex microscopic       Follow up plan: Return in about 6 months (around 09/18/2018) for Hemoglobin A1c, BMP,  Lipids, ALT, AST.

## 2018-03-20 NOTE — Addendum Note (Signed)
Addended by: Gerrit Halls L on: 03/20/2018 04:24 PM   Modules accepted: Orders

## 2018-03-20 NOTE — Assessment & Plan Note (Signed)
A voluntary discussion about advanced care planning including explanation and discussion of advanced directives was extentively discussed with the patient.  Explained about the healthcare proxy and living will was reviewed and packet with forms with expiration of how to fill them out was given.  Time spent: Encounter 16+ min individuals present: Patient 

## 2018-03-20 NOTE — Assessment & Plan Note (Signed)
Discussed anxiety care and treatment gave refill on Valium patient will continue limited use.

## 2018-03-20 NOTE — Assessment & Plan Note (Signed)
The current medical regimen is effective;  continue present plan and medications.  

## 2018-03-21 LAB — CBC WITH DIFFERENTIAL/PLATELET
BASOS ABS: 0 10*3/uL (ref 0.0–0.2)
BASOS: 1 %
EOS (ABSOLUTE): 0.2 10*3/uL (ref 0.0–0.4)
Eos: 4 %
Hematocrit: 37 % (ref 34.0–46.6)
Hemoglobin: 11.9 g/dL (ref 11.1–15.9)
IMMATURE GRANS (ABS): 0 10*3/uL (ref 0.0–0.1)
IMMATURE GRANULOCYTES: 0 %
LYMPHS: 20 %
Lymphocytes Absolute: 1.1 10*3/uL (ref 0.7–3.1)
MCH: 26.4 pg — ABNORMAL LOW (ref 26.6–33.0)
MCHC: 32.2 g/dL (ref 31.5–35.7)
MCV: 82 fL (ref 79–97)
Monocytes Absolute: 0.4 10*3/uL (ref 0.1–0.9)
Monocytes: 7 %
NEUTROS PCT: 68 %
Neutrophils Absolute: 3.7 10*3/uL (ref 1.4–7.0)
PLATELETS: 287 10*3/uL (ref 150–450)
RBC: 4.5 x10E6/uL (ref 3.77–5.28)
RDW: 13.3 % (ref 12.3–15.4)
WBC: 5.4 10*3/uL (ref 3.4–10.8)

## 2018-03-21 LAB — COMPREHENSIVE METABOLIC PANEL
ALK PHOS: 155 IU/L — AB (ref 39–117)
ALT: 20 IU/L (ref 0–32)
AST: 16 IU/L (ref 0–40)
Albumin/Globulin Ratio: 1.4 (ref 1.2–2.2)
Albumin: 4.3 g/dL (ref 3.6–4.8)
BUN/Creatinine Ratio: 13 (ref 12–28)
BUN: 21 mg/dL (ref 8–27)
Bilirubin Total: 0.4 mg/dL (ref 0.0–1.2)
CALCIUM: 9.4 mg/dL (ref 8.7–10.3)
CO2: 23 mmol/L (ref 20–29)
Chloride: 101 mmol/L (ref 96–106)
Creatinine, Ser: 1.66 mg/dL — ABNORMAL HIGH (ref 0.57–1.00)
GFR calc Af Amer: 37 mL/min/{1.73_m2} — ABNORMAL LOW (ref >59)
GFR calc non Af Amer: 32 mL/min/{1.73_m2} — ABNORMAL LOW (ref >59)
GLOBULIN, TOTAL: 3 g/dL (ref 1.5–4.5)
GLUCOSE: 142 mg/dL — AB (ref 65–99)
Potassium: 4.5 mmol/L (ref 3.5–5.2)
SODIUM: 140 mmol/L (ref 134–144)
Total Protein: 7.3 g/dL (ref 6.0–8.5)

## 2018-03-21 LAB — LIPID PANEL
Chol/HDL Ratio: 3.9 ratio (ref 0.0–4.4)
Cholesterol, Total: 166 mg/dL (ref 100–199)
HDL: 43 mg/dL (ref >39)
LDL CALC: 96 mg/dL (ref 0–99)
TRIGLYCERIDES: 137 mg/dL (ref 0–149)
VLDL Cholesterol Cal: 27 mg/dL (ref 5–40)

## 2018-03-21 LAB — TSH: TSH: 2.89 u[IU]/mL (ref 0.450–4.500)

## 2018-04-25 ENCOUNTER — Other Ambulatory Visit: Payer: Self-pay | Admitting: Family Medicine

## 2018-04-25 DIAGNOSIS — N183 Chronic kidney disease, stage 3 unspecified: Secondary | ICD-10-CM

## 2018-05-27 ENCOUNTER — Other Ambulatory Visit: Payer: Self-pay

## 2018-05-27 NOTE — Telephone Encounter (Signed)
Last visit 03/20/2018. Appointment 10/08/2018

## 2018-05-28 MED ORDER — FLUTICASONE PROPIONATE 50 MCG/ACT NA SUSP: 2.0000 | Freq: Every day | NASAL | 12 refills | Status: DC

## 2018-07-31 ENCOUNTER — Ambulatory Visit: Payer: PPO

## 2018-08-07 ENCOUNTER — Other Ambulatory Visit: Payer: Self-pay | Admitting: Family Medicine

## 2018-08-07 DIAGNOSIS — F419 Anxiety disorder, unspecified: Secondary | ICD-10-CM

## 2018-08-07 NOTE — Telephone Encounter (Signed)
Requested medication (s) are due for refill today: Yes  Requested medication (s) are on the active medication list: Yes  Last refill:  03/20/18  Future visit scheduled: Yes  Notes to clinic:  See request    Requested Prescriptions  Pending Prescriptions Disp Refills   diazepam (VALIUM) 5 MG tablet [Pharmacy Med Name: DIAZEPAM 5 MG TABLET] 20 tablet 0    Sig: Take 1 tablet (5 mg total) by mouth daily as needed for anxiety.     Not Delegated - Psychiatry:  Anxiolytics/Hypnotics Failed - 08/07/2018  3:01 PM      Failed - This refill cannot be delegated      Failed - Urine Drug Screen completed in last 360 days.      Passed - Valid encounter within last 6 months    Recent Outpatient Visits          4 months ago Immunization due   [place], [place]   9 months ago Tick bite, initial encounter   [facility] Carles Collet M, PA-C   10 months ago Essential hypertension   [facility] Crissman, Jeannette How, MD   11 months ago    [place], Jeannette How, MD   1 year ago Essential hypertension   [place], Jeannette How, MD      Future Appointments            In 2 months Crissman, Jeannette How, MD [facility]

## 2018-10-05 ENCOUNTER — Other Ambulatory Visit: Payer: Self-pay | Admitting: Family Medicine

## 2018-10-05 DIAGNOSIS — F419 Anxiety disorder, unspecified: Secondary | ICD-10-CM

## 2018-10-05 NOTE — Telephone Encounter (Signed)
Requested medication (s) are due for refill today: Yes  Requested medication (s) are on the active medication list: Yes  Last refill:  08/11/18  Future visit scheduled: Yes  Notes to clinic:  See request    Requested Prescriptions  Pending Prescriptions Disp Refills   diazepam (VALIUM) 5 MG tablet [Pharmacy Med Name: DIAZEPAM 5 MG TABLET] 20 tablet 0    Sig: TAKE 1 TABLET (5 MG TOTAL) BY MOUTH DAILY AS NEEDED FOR ANXIETY.     Not Delegated - Psychiatry:  Anxiolytics/Hypnotics Failed - 10/05/2018  5:55 AM      Failed - This refill cannot be delegated      Failed - Urine Drug Screen completed in last 360 days.      Failed - Valid encounter within last 6 months    Recent Outpatient Visits          6 months ago Immunization due   [place], MD   11 months ago Tick bite, initial encounter   [facility] Trinna Post, PA-C   1 year ago Essential hypertension   [place], Jeannette How, MD   1 year ago    [facility], Jeannette How, MD   1 year ago Essential hypertension   [place], Jeannette How, MD      Future Appointments            In 3 days Crissman, Jeannette How, MD [facility], [facility]

## 2018-10-08 ENCOUNTER — Ambulatory Visit: Payer: PPO | Admitting: Family Medicine

## 2018-10-09 ENCOUNTER — Telehealth: Payer: Self-pay | Admitting: Family Medicine

## 2018-10-09 DIAGNOSIS — E1122 Type 2 diabetes mellitus with diabetic chronic kidney disease: Secondary | ICD-10-CM

## 2018-10-09 DIAGNOSIS — I10 Essential (primary) hypertension: Secondary | ICD-10-CM

## 2018-10-09 DIAGNOSIS — E7800 Pure hypercholesterolemia, unspecified: Secondary | ICD-10-CM

## 2018-10-09 NOTE — Telephone Encounter (Signed)
Pt called in to reschedule states she doesn't want to do telephone visit advised she can have lab work done and do phone visit. She is ok with that. Routing to provider to see if we can get orders in.

## 2018-10-13 ENCOUNTER — Other Ambulatory Visit: Payer: Self-pay

## 2018-10-13 ENCOUNTER — Other Ambulatory Visit: Payer: PPO

## 2018-10-13 DIAGNOSIS — N184 Chronic kidney disease, stage 4 (severe): Secondary | ICD-10-CM | POA: Diagnosis not present

## 2018-10-13 DIAGNOSIS — E1122 Type 2 diabetes mellitus with diabetic chronic kidney disease: Secondary | ICD-10-CM | POA: Diagnosis not present

## 2018-10-13 DIAGNOSIS — I10 Essential (primary) hypertension: Secondary | ICD-10-CM

## 2018-10-13 DIAGNOSIS — E7800 Pure hypercholesterolemia, unspecified: Secondary | ICD-10-CM

## 2018-10-13 LAB — BAYER DCA HB A1C WAIVED: HB A1C (BAYER DCA - WAIVED): 7.7 % — ABNORMAL HIGH (ref <7.0)

## 2018-10-13 NOTE — Telephone Encounter (Signed)
Ordered labs

## 2018-10-13 NOTE — Telephone Encounter (Signed)
Lab appointment scheduled 

## 2018-10-13 NOTE — Addendum Note (Signed)
Addended by: Golden Pop A on: 10/13/2018 08:07 AM   Modules accepted: Orders

## 2018-10-14 ENCOUNTER — Ambulatory Visit (INDEPENDENT_AMBULATORY_CARE_PROVIDER_SITE_OTHER): Payer: PPO | Admitting: Family Medicine

## 2018-10-14 ENCOUNTER — Encounter: Payer: Self-pay | Admitting: Family Medicine

## 2018-10-14 DIAGNOSIS — I10 Essential (primary) hypertension: Secondary | ICD-10-CM | POA: Diagnosis not present

## 2018-10-14 DIAGNOSIS — N184 Chronic kidney disease, stage 4 (severe): Secondary | ICD-10-CM | POA: Diagnosis not present

## 2018-10-14 DIAGNOSIS — E1122 Type 2 diabetes mellitus with diabetic chronic kidney disease: Secondary | ICD-10-CM | POA: Diagnosis not present

## 2018-10-14 DIAGNOSIS — E7800 Pure hypercholesterolemia, unspecified: Secondary | ICD-10-CM | POA: Diagnosis not present

## 2018-10-14 LAB — BASIC METABOLIC PANEL
BUN/Creatinine Ratio: 15 (ref 12–28)
BUN: 29 mg/dL — ABNORMAL HIGH (ref 8–27)
CO2: 19 mmol/L — ABNORMAL LOW (ref 20–29)
Calcium: 9.5 mg/dL (ref 8.7–10.3)
Chloride: 103 mmol/L (ref 96–106)
Creatinine, Ser: 2 mg/dL — ABNORMAL HIGH (ref 0.57–1.00)
GFR calc Af Amer: 29 mL/min/{1.73_m2} — ABNORMAL LOW (ref >59)
GFR calc non Af Amer: 25 mL/min/{1.73_m2} — ABNORMAL LOW (ref >59)
Glucose: 174 mg/dL — ABNORMAL HIGH (ref 65–99)
Potassium: 4.2 mmol/L (ref 3.5–5.2)
Sodium: 138 mmol/L (ref 134–144)

## 2018-10-14 LAB — LIPID PANEL
Chol/HDL Ratio: 4.4 ratio (ref 0.0–4.4)
Cholesterol, Total: 159 mg/dL (ref 100–199)
HDL: 36 mg/dL — ABNORMAL LOW (ref >39)
LDL Calculated: 93 mg/dL (ref 0–99)
Triglycerides: 152 mg/dL — ABNORMAL HIGH (ref 0–149)
VLDL Cholesterol Cal: 30 mg/dL (ref 5–40)

## 2018-10-14 MED ORDER — SITAGLIPTIN PHOSPHATE 25 MG PO TABS: 25.0000 mg | ORAL_TABLET | Freq: Every day | ORAL | 3 refills | Status: DC

## 2018-10-14 NOTE — Assessment & Plan Note (Signed)
Discussed poor control of diabetes and worsening renal failure will refer back to nephrology. Nephrology to help with renally dosing medications.

## 2018-10-14 NOTE — Assessment & Plan Note (Signed)
Discussed cholesterol elevated and need to start medications.  Very reluctant that now will hold temporarily.

## 2018-10-14 NOTE — Assessment & Plan Note (Signed)
Patient not checking blood pressure at home not sure if good control or not but will continue medications.

## 2018-10-14 NOTE — Progress Notes (Signed)
There were no vitals taken for this visit.   Subjective:    Patient ID: Katie Singh, female    DOB: 11/23/1950, 68 y.o.   MRN: [id number]  HPI: Katie Singh is a 68 y.o. female  DM recheck  Telemedicine using audio/video telecommunications for a synchronous communication visit. Today's visit due to COVID-19 isolation precautions I connected with and verified that I am speaking with the correct person using two identifiers.   I discussed the limitations, risks, security and privacy concerns of performing an evaluation and management service by telecommunication and the availability of in person appointments. I also discussed with the patient that there may be a patient responsible charge related to this service. The patient expressed understanding and agreed to proceed. The patient's location is home. I am at home.  Patient had blood work done just prior to visit indicating poor control of diabetes. Discussed diabetes care and treatment with patient who is very reluctant to escalate care due to perceived side effects. Discussed declining renal function with serious concerns.  Again patient very reluctant to escalate care and treatment. Discussed elevated cholesterol and again patient very reluctant to escalate care and treatment.  Relevant past medical, surgical, family and social history reviewed and updated as indicated. Interim medical history since our last visit reviewed. Allergies and medications reviewed and updated.  Review of Systems  Constitutional: Negative.   Respiratory: Negative.   Cardiovascular: Negative.     Per HPI unless specifically indicated above     Objective:    There were no vitals taken for this visit.  Wt Readings from Last 3 Encounters:  03/20/18 287 lb 4 oz (130.3 kg)  10/31/17 282 lb (127.9 kg)  09/17/17 275 lb 9.6 oz (125 kg)    Physical Exam  Results for orders placed or performed in visit on 00/76/22  Basic metabolic panel   Result Value Ref Range   Glucose 174 (H) 65 - 99 mg/dL   BUN 29 (H) 8 - 27 mg/dL   Creatinine, Ser 2.00 (H) 0.57 - 1.00 mg/dL   GFR calc non Af Amer 25 (L) >59 mL/min/1.73   GFR calc Af Amer 29 (L) >59 mL/min/1.73   BUN/Creatinine Ratio 15 12 - 28   Sodium 138 134 - 144 mmol/L   Potassium 4.2 3.5 - 5.2 mmol/L   Chloride 103 96 - 106 mmol/L   CO2 19 (L) 20 - 29 mmol/L   Calcium 9.5 8.7 - 10.3 mg/dL  Bayer DCA Hb A1c Waived  Result Value Ref Range   HB A1C (BAYER DCA - WAIVED) 7.7 (H) <7.0 %  Lipid panel  Result Value Ref Range   Cholesterol, Total 159 100 - 199 mg/dL   Triglycerides 152 (H) 0 - 149 mg/dL   HDL 36 (L) >39 mg/dL   VLDL Cholesterol Cal 30 5 - 40 mg/dL   LDL Calculated 93 0 - 99 mg/dL   Chol/HDL Ratio 4.4 0.0 - 4.4 ratio      Assessment & Plan:   Problem List Items Addressed This Visit      Cardiovascular and Mediastinum   Hypertension    Patient not checking blood pressure at home not sure if good control or not but will continue medications.      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 Nephrology to help with renally dosing medications.      Relevant Orders   Ambulatory referral to Nephrology     Other   Hyperlipidemia    Discussed cholesterol elevated and need to start medications.  Very reluctant that now will hold temporarily.          I discussed the assessment and treatment plan with the patient. The patient was provided an opportunity to ask questions and all were answered. The patient agreed with the plan and demonstrated an understanding of the instructions.   The patient was advised to call back or seek an in-person evaluation if the symptoms worsen or if the condition fails to improve as anticipated.   I provided 21+ minutes of time during this encounter. Follow up plan:  Return in about 4 weeks (around 11/11/2018).

## 2018-10-27 ENCOUNTER — Encounter: Payer: Self-pay | Admitting: Family Medicine

## 2018-10-27 NOTE — Assessment & Plan Note (Signed)
The current medical regimen is effective;  continue present plan and medications.  

## 2018-10-29 ENCOUNTER — Telehealth: Payer: Self-pay | Admitting: Family Medicine

## 2018-10-29 NOTE — Chronic Care Management (AMB) (Signed)
Chronic Care Management   Note  10/29/2018 Name: [name] MRN: [id number] DOB: June 20, 1950  Katie Singh is a 68 y.o. year old female who is a primary care patient of [facility], Jeannette How, MD. I reached out to [name] by phone today in response to a referral sent by Katie Singh's health plan.    Katie Singh was given information about Chronic Care Management services today including:  1. CCM service includes personalized support from designated clinical staff supervised by her physician, including individualized plan of care and coordination with other care providers 2. 24/7 contact phone numbers for assistance for urgent and routine care needs. 3. Service will only be billed when office clinical staff spend 20 minutes or more in a month to coordinate care. 4. Only one practitioner may furnish and bill the service in a calendar month. 5. The patient may stop CCM services at any time (effective at the end of the month) by phone call to the office staff. 6. The patient will be responsible for cost sharing (co-pay) of up to 20% of the service fee (after annual deductible is met).  Patient did not agree to enrollment in care management services and does not wish to consider at this time.  Follow up plan: The patient has been provided with contact information for the chronic care management team and has been advised to call with any health related questions or concerns.   [place]  [email or web address]   ??[phone]

## 2018-10-29 NOTE — Chronic Care Management (AMB) (Signed)
°  Chronic Care Management   Outreach Note  10/29/2018 Name: Katie Singh MRN: [id number] DOB: 1950-10-24  Referred by: Guadalupe Maple, MD Reason for referral : Chronic Care Management (Initial CCM outreach was unsuccessful )   An unsuccessful telephone outreach was attempted today. The patient was referred to the case management team by for assistance with chronic care management and care coordination.   Follow Up Plan: The care management team will reach out to the patient again over the next 7 days.   [place]  [email or web address]   ??[phone]

## 2018-11-05 ENCOUNTER — Other Ambulatory Visit: Payer: Self-pay | Admitting: Family Medicine

## 2018-11-05 ENCOUNTER — Ambulatory Visit: Payer: Self-pay | Admitting: *Deleted

## 2018-11-05 DIAGNOSIS — M1990 Unspecified osteoarthritis, unspecified site: Secondary | ICD-10-CM

## 2018-11-05 NOTE — Telephone Encounter (Signed)
  Reason for Disposition . Caller has URGENT medication question about med that PCP prescribed and triager unable to answer question  Answer Assessment - Initial Assessment Questions 1. SYMPTOMS: "Do you have any symptoms?"     I was started on Januvia 2 wks ago.   My legs and knees started hurting so bad.   I stopped taking it for 3 days and the pain went away.   I took one yesterday   morning and I'm aching again.  This morning I still ache. 2. SEVERITY: If symptoms are present, ask "Are they mild, moderate or severe?"     I did not take one this morning.  Protocols used: MEDICATION QUESTION CALL-A-AH

## 2018-11-05 NOTE — Telephone Encounter (Signed)
Would Dr. Jeananne Rama like to continue this rx

## 2018-11-06 NOTE — Telephone Encounter (Signed)
Message relayed to patient. Verbalized understanding and denied questions.   

## 2018-11-06 NOTE — Telephone Encounter (Signed)
Please let her know that leg pain is not usually a symptom of januvia. She can cut it in 1/2 for a week and let us know how she's doing.

## 2018-11-07 NOTE — Telephone Encounter (Signed)
Pt stated that she just wanted to have some in case her legs started to hurt but would call us to schedule it later.

## 2018-11-11 DIAGNOSIS — E1129 Type 2 diabetes mellitus with other diabetic kidney complication: Secondary | ICD-10-CM | POA: Diagnosis not present

## 2018-11-11 DIAGNOSIS — I129 Hypertensive chronic kidney disease with stage 1 through stage 4 chronic kidney disease, or unspecified chronic kidney disease: Secondary | ICD-10-CM | POA: Diagnosis not present

## 2018-11-11 DIAGNOSIS — N184 Chronic kidney disease, stage 4 (severe): Secondary | ICD-10-CM | POA: Diagnosis not present

## 2018-12-12 ENCOUNTER — Other Ambulatory Visit: Payer: Self-pay

## 2019-01-05 ENCOUNTER — Other Ambulatory Visit: Payer: Self-pay | Admitting: Family Medicine

## 2019-01-13 DIAGNOSIS — M1990 Unspecified osteoarthritis, unspecified site: Secondary | ICD-10-CM | POA: Diagnosis not present

## 2019-01-13 DIAGNOSIS — G250 Essential tremor: Secondary | ICD-10-CM | POA: Diagnosis not present

## 2019-01-13 DIAGNOSIS — G629 Polyneuropathy, unspecified: Secondary | ICD-10-CM | POA: Diagnosis not present

## 2019-01-13 DIAGNOSIS — M8949 Other hypertrophic osteoarthropathy, multiple sites: Secondary | ICD-10-CM | POA: Diagnosis not present

## 2019-02-03 DIAGNOSIS — R69 Illness, unspecified: Secondary | ICD-10-CM | POA: Diagnosis not present

## 2019-02-11 ENCOUNTER — Telehealth: Payer: Self-pay | Admitting: Family Medicine

## 2019-02-11 DIAGNOSIS — Z1231 Encounter for screening mammogram for malignant neoplasm of breast: Secondary | ICD-10-CM

## 2019-02-11 DIAGNOSIS — Z3009 Encounter for other general counseling and advice on contraception: Secondary | ICD-10-CM

## 2019-02-11 NOTE — Telephone Encounter (Signed)
mamo ordered

## 2019-04-13 ENCOUNTER — Other Ambulatory Visit: Payer: Self-pay

## 2019-04-13 DIAGNOSIS — N183 Chronic kidney disease, stage 3 unspecified: Secondary | ICD-10-CM

## 2019-04-13 DIAGNOSIS — N189 Chronic kidney disease, unspecified: Secondary | ICD-10-CM | POA: Diagnosis not present

## 2019-04-13 DIAGNOSIS — R8290 Unspecified abnormal findings in urine: Secondary | ICD-10-CM | POA: Diagnosis not present

## 2019-04-13 DIAGNOSIS — E1122 Type 2 diabetes mellitus with diabetic chronic kidney disease: Secondary | ICD-10-CM | POA: Diagnosis not present

## 2019-04-13 DIAGNOSIS — N184 Chronic kidney disease, stage 4 (severe): Secondary | ICD-10-CM | POA: Diagnosis not present

## 2019-04-13 DIAGNOSIS — F419 Anxiety disorder, unspecified: Secondary | ICD-10-CM

## 2019-04-13 DIAGNOSIS — Z87442 Personal history of urinary calculi: Secondary | ICD-10-CM | POA: Diagnosis not present

## 2019-04-13 DIAGNOSIS — I10 Essential (primary) hypertension: Secondary | ICD-10-CM

## 2019-04-13 MED ORDER — GABAPENTIN 300 MG PO CAPS: 300.0000 mg | ORAL_CAPSULE | Freq: Four times a day (QID) | ORAL | 0 refills | Status: DC

## 2019-04-13 MED ORDER — LOSARTAN POTASSIUM 100 MG PO TABS: 100.0000 mg | ORAL_TABLET | Freq: Every day | ORAL | 0 refills | Status: DC

## 2019-04-13 MED ORDER — PAROXETINE HCL 40 MG PO TABS: 40.0000 mg | ORAL_TABLET | ORAL | 0 refills | Status: DC

## 2019-04-13 MED ORDER — DILTIAZEM HCL ER COATED BEADS 180 MG PO CP24: 180.0000 mg | ORAL_CAPSULE | Freq: Every day | ORAL | 0 refills | Status: DC

## 2019-04-13 NOTE — Telephone Encounter (Signed)
Needs appointment

## 2019-04-13 NOTE — Telephone Encounter (Signed)
Scheduled 12/9 with Henrine Screws

## 2019-04-13 NOTE — Telephone Encounter (Signed)
Please get patient scheduled.  °

## 2019-04-19 ENCOUNTER — Encounter: Payer: Self-pay | Admitting: Nurse Practitioner

## 2019-04-19 DIAGNOSIS — M1990 Unspecified osteoarthritis, unspecified site: Secondary | ICD-10-CM | POA: Insufficient documentation

## 2019-04-22 ENCOUNTER — Ambulatory Visit: Payer: PPO | Admitting: Nurse Practitioner

## 2019-05-07 ENCOUNTER — Other Ambulatory Visit: Payer: Self-pay | Admitting: Family Medicine

## 2019-05-07 DIAGNOSIS — I10 Essential (primary) hypertension: Secondary | ICD-10-CM

## 2019-05-07 NOTE — Telephone Encounter (Signed)
Requested medication (s) are due for refill today: yes  Requested medication (s) are on the active medication list: yes  Last refill:  04/13/2019  Future visit scheduled: yes  Notes to clinic:  Patient schedule for 05/19/2018 No valid encounter within last 6 months Please advise    Requested Prescriptions  Pending Prescriptions Disp Refills   diltiazem (CARDIZEM CD) 180 MG 24 hr capsule [Pharmacy Med Name: DILTIAZEM 24H ER(CD) 180 MG CP] 30 capsule 0    Sig: TAKE 1 CAPSULE BY MOUTH EVERY DAY      Cardiovascular:  Calcium Channel Blockers Failed - 05/07/2019  8:43 AM      Failed - Valid encounter within last 6 months    Recent Outpatient Visits           6 months ago Essential hypertension   [place] [facility], Jeannette How, MD   1 year ago Immunization due   [place], Jeannette How, MD   1 year ago Tick bite, initial encounter   [facility] Trinna Post, PA-C   1 year ago Essential hypertension   [place] Crissman, Jeannette How, MD   1 year ago Essential hypertension   [place], MD       Future Appointments             In 1 week [place], Barbaraann Faster, NP [facility], [facility]            Passed - Last BP in normal range    BP Readings from Last 1 Encounters:  03/20/18 132/82

## 2019-05-07 NOTE — Telephone Encounter (Signed)
Routing to provider  

## 2019-05-12 ENCOUNTER — Other Ambulatory Visit: Payer: Self-pay

## 2019-05-12 DIAGNOSIS — I10 Essential (primary) hypertension: Secondary | ICD-10-CM

## 2019-05-12 MED ORDER — FUROSEMIDE 40 MG PO TABS: 40.0000 mg | ORAL_TABLET | Freq: Every day | ORAL | 0 refills | Status: DC

## 2019-05-20 ENCOUNTER — Ambulatory Visit: Payer: Medicare HMO | Admitting: Nurse Practitioner

## 2019-06-05 ENCOUNTER — Other Ambulatory Visit: Payer: Self-pay | Admitting: Family Medicine

## 2019-06-05 ENCOUNTER — Other Ambulatory Visit: Payer: Self-pay | Admitting: Nurse Practitioner

## 2019-06-05 DIAGNOSIS — I10 Essential (primary) hypertension: Secondary | ICD-10-CM

## 2019-06-19 ENCOUNTER — Other Ambulatory Visit: Payer: Self-pay | Admitting: Nurse Practitioner

## 2019-06-19 ENCOUNTER — Other Ambulatory Visit: Payer: Self-pay | Admitting: Family Medicine

## 2019-06-19 DIAGNOSIS — I10 Essential (primary) hypertension: Secondary | ICD-10-CM

## 2019-06-28 ENCOUNTER — Other Ambulatory Visit: Payer: Self-pay | Admitting: Family Medicine

## 2019-06-28 DIAGNOSIS — N183 Chronic kidney disease, stage 3 unspecified: Secondary | ICD-10-CM

## 2019-06-28 NOTE — Telephone Encounter (Signed)
Requested Prescriptions  Pending Prescriptions Disp Refills  . gabapentin (NEURONTIN) 300 MG capsule [Pharmacy Med Name: GABAPENTIN 300 MG CAPSULE] 120 capsule 0    Sig: TAKE 1 CAPSULE (300 MG TOTAL) BY MOUTH 4 (FOUR) TIMES DAILY.     Neurology: Anticonvulsants - gabapentin Passed - 06/28/2019  8:31 AM      Passed - Valid encounter within last 12 months    Recent Outpatient Visits          8 months ago Essential hypertension   [place] [facility], Jeannette How, MD   1 year ago Immunization due   [place], Jeannette How, MD   1 year ago Tick bite, initial encounter   [facility] Trinna Post, PA-C   1 year ago Essential hypertension   [place], Jeannette How, MD   1 year ago Essential hypertension   [facility] Crissman, Jeannette How, MD

## 2019-06-29 ENCOUNTER — Other Ambulatory Visit: Payer: Self-pay

## 2019-06-29 MED ORDER — FLUTICASONE PROPIONATE 50 MCG/ACT NA SUSP: 2.0000 | Freq: Every day | NASAL | 12 refills | Status: DC

## 2019-06-29 NOTE — Telephone Encounter (Signed)
Refill request for Flonase LOV: 10/14/18 Next Appt: none

## 2019-07-01 ENCOUNTER — Other Ambulatory Visit: Payer: Self-pay

## 2019-07-01 ENCOUNTER — Encounter: Payer: Self-pay | Admitting: Nurse Practitioner

## 2019-07-01 ENCOUNTER — Ambulatory Visit (INDEPENDENT_AMBULATORY_CARE_PROVIDER_SITE_OTHER): Payer: Medicare HMO | Admitting: Nurse Practitioner

## 2019-07-01 VITALS — BP 125/83 | HR 85 | Temp 97.6°F

## 2019-07-01 DIAGNOSIS — E1159 Type 2 diabetes mellitus with other circulatory complications: Secondary | ICD-10-CM | POA: Diagnosis not present

## 2019-07-01 DIAGNOSIS — E1169 Type 2 diabetes mellitus with other specified complication: Secondary | ICD-10-CM | POA: Diagnosis not present

## 2019-07-01 DIAGNOSIS — Z79899 Other long term (current) drug therapy: Secondary | ICD-10-CM

## 2019-07-01 DIAGNOSIS — E1122 Type 2 diabetes mellitus with diabetic chronic kidney disease: Secondary | ICD-10-CM

## 2019-07-01 DIAGNOSIS — I10 Essential (primary) hypertension: Secondary | ICD-10-CM

## 2019-07-01 DIAGNOSIS — R69 Illness, unspecified: Secondary | ICD-10-CM | POA: Diagnosis not present

## 2019-07-01 DIAGNOSIS — M1990 Unspecified osteoarthritis, unspecified site: Secondary | ICD-10-CM

## 2019-07-01 DIAGNOSIS — F419 Anxiety disorder, unspecified: Secondary | ICD-10-CM

## 2019-07-01 DIAGNOSIS — G4733 Obstructive sleep apnea (adult) (pediatric): Secondary | ICD-10-CM

## 2019-07-01 DIAGNOSIS — N184 Chronic kidney disease, stage 4 (severe): Secondary | ICD-10-CM | POA: Diagnosis not present

## 2019-07-01 DIAGNOSIS — F329 Major depressive disorder, single episode, unspecified: Secondary | ICD-10-CM

## 2019-07-01 DIAGNOSIS — I152 Hypertension secondary to endocrine disorders: Secondary | ICD-10-CM

## 2019-07-01 DIAGNOSIS — E785 Hyperlipidemia, unspecified: Secondary | ICD-10-CM | POA: Diagnosis not present

## 2019-07-01 DIAGNOSIS — G720 Drug-induced myopathy: Secondary | ICD-10-CM

## 2019-07-01 LAB — BAYER DCA HB A1C WAIVED: HB A1C (BAYER DCA - WAIVED): 5.9 % (ref <7.0)

## 2019-07-01 LAB — MICROALBUMIN, URINE WAIVED
Creatinine, Urine Waived: 200 mg/dL (ref 10–300)
Microalb, Ur Waived: 150 mg/L — ABNORMAL HIGH (ref 0–19)

## 2019-07-01 MED ORDER — GABAPENTIN 300 MG PO CAPS: 300.0000 mg | ORAL_CAPSULE | Freq: Four times a day (QID) | ORAL | 3 refills | Status: DC

## 2019-07-01 MED ORDER — DIAZEPAM 5 MG PO TABS: 5.0000 mg | ORAL_TABLET | Freq: Every day | ORAL | 0 refills | Status: DC | PRN

## 2019-07-01 MED ORDER — TRAMADOL HCL 50 MG PO TABS: 50.0000 mg | ORAL_TABLET | Freq: Two times a day (BID) | ORAL | 0 refills | Status: DC

## 2019-07-01 MED ORDER — LOSARTAN POTASSIUM 25 MG PO TABS: 25.0000 mg | ORAL_TABLET | Freq: Every day | ORAL | 3 refills | Status: DC

## 2019-07-01 MED ORDER — DILTIAZEM HCL ER COATED BEADS 180 MG PO CP24: ORAL_CAPSULE | ORAL | 3 refills | Status: DC

## 2019-07-01 MED ORDER — FUROSEMIDE 40 MG PO TABS: 40.0000 mg | ORAL_TABLET | Freq: Every day | ORAL | 3 refills | Status: DC

## 2019-07-01 MED ORDER — SITAGLIPTIN PHOSPHATE 25 MG PO TABS: 25.0000 mg | ORAL_TABLET | Freq: Every day | ORAL | 3 refills | Status: DC

## 2019-07-01 MED ORDER — PAROXETINE HCL 40 MG PO TABS: 40.0000 mg | ORAL_TABLET | ORAL | 3 refills | Status: DC

## 2019-07-01 NOTE — Assessment & Plan Note (Signed)
Chronic, ongoing.  Denies SI/HI.  PHQ = 12 today.  Continue Paxil at this time, however discussed at length BEERS criteria and possible transition to Sertraline in upcoming months.  Would benefit from Cymbalta, but due to kidney function will need to avoid this.  Refills sent.  Return in 3 months.

## 2019-07-01 NOTE — Assessment & Plan Note (Signed)
Chronic, ongoing.  Followed by Dr. Jefm Bryant.  Continue current Plaquenil dose.  Refills on Tramadol sent, using appropriately with last fill 03/20/2018 for #14 pills, minimal use.  Recommend to continue to minimally use this and not to take along with her Valium.  Refills sent today.  UDS obtained, contract next visit.

## 2019-07-01 NOTE — Assessment & Plan Note (Signed)
Refer to anxiety plan.  UDS today and obtain contract next visit.

## 2019-07-01 NOTE — Assessment & Plan Note (Signed)
Chronic, ongoing.  Continue collaboration with nephrology and review of notes.  CMP today, may need to further adjust or discontinue Losartan dependent on K+ on these labs.

## 2019-07-01 NOTE — Assessment & Plan Note (Addendum)
Chronic, ongoing.  Poor tolerance of statin in past, no current medication.  Continue diet focus.  Lipid panel today.

## 2019-07-01 NOTE — Assessment & Plan Note (Addendum)
Chronic, ongoing.  A1C today 5.9%.  Continue Januvia at 25 MG, renal dosing.  Urine micro ALB 150 and A:C >300.  Continue collaboration with nephrology and review notes.  May need to consider renal dosing of Gabapentin, will review CMP upon return, but based on current CrCl 900 MG a day max dose.  Refills sent.  Return in 3 months.

## 2019-07-01 NOTE — Patient Instructions (Signed)
Carbohydrate Counting for Diabetes Mellitus, Adult  Carbohydrate counting is a method of keeping track of how many carbohydrates you eat. Eating carbohydrates naturally increases the amount of sugar (glucose) in the blood. Counting how many carbohydrates you eat helps keep your blood glucose within normal limits, which helps you manage your diabetes (diabetes mellitus). It is important to know how many carbohydrates you can safely have in each meal. This is different for every person. A diet and nutrition specialist (registered dietitian) can help you make a meal plan and calculate how many carbohydrates you should have at each meal and snack. Carbohydrates are found in the following foods:  Grains, such as breads and cereals.  Dried beans and soy products.  Starchy vegetables, such as potatoes, peas, and corn.  Fruit and fruit juices.  Milk and yogurt.  Sweets and snack foods, such as cake, cookies, candy, chips, and soft drinks. How do I count carbohydrates? There are two ways to count carbohydrates in food. You can use either of the methods or a combination of both. Reading "Nutrition Facts" on packaged food The "Nutrition Facts" list is included on the labels of almost all packaged foods and beverages in the [place] It includes:  The serving size.  Information about nutrients in each serving, including the grams (g) of carbohydrate per serving. To use the "Nutrition Facts":  Decide how many servings you will have.  Multiply the number of servings by the number of carbohydrates per serving.  The resulting number is the total amount of carbohydrates that you will be having. Learning standard serving sizes of other foods When you eat carbohydrate foods that are not packaged or do not include "Nutrition Facts" on the label, you need to measure the servings in order to count the amount of carbohydrates:  Measure the foods that you will eat with a food scale or measuring cup, if  needed.  Decide how many standard-size servings you will eat.  Multiply the number of servings by 15. Most carbohydrate-rich foods have about 15 g of carbohydrates per serving. ? For example, if you eat 8 oz (170 g) of strawberries, you will have eaten 2 servings and 30 g of carbohydrates (2 servings x 15 g = 30 g).  For foods that have more than one food mixed, such as soups and casseroles, you must count the carbohydrates in each food that is included. The following list contains standard serving sizes of common carbohydrate-rich foods. Each of these servings has about 15 g of carbohydrates:   hamburger bun or  English muffin.   oz (15 mL) syrup.   oz (14 g) jelly.  1 slice of bread.  1 six-inch tortilla.  3 oz (85 g) cooked rice or pasta.  4 oz (113 g) cooked dried beans.  4 oz (113 g) starchy vegetable, such as peas, corn, or potatoes.  4 oz (113 g) hot cereal.  4 oz (113 g) mashed potatoes or  of a large baked potato.  4 oz (113 g) canned or frozen fruit.  4 oz (120 mL) fruit juice.  4-6 crackers.  6 chicken nuggets.  6 oz (170 g) unsweetened dry cereal.  6 oz (170 g) plain fat-free yogurt or yogurt sweetened with artificial sweeteners.  8 oz (240 mL) milk.  8 oz (170 g) fresh fruit or one small piece of fruit.  24 oz (680 g) popped popcorn. Example of carbohydrate counting Sample meal  3 oz (85 g) chicken breast.  6 oz (170 g)   brown rice.  4 oz (113 g) corn.  8 oz (240 mL) milk.  8 oz (170 g) strawberries with sugar-free whipped topping. Carbohydrate calculation 1. Identify the foods that contain carbohydrates: ? Rice. ? Corn. ? Milk. ? Strawberries. 2. Calculate how many servings you have of each food: ? 2 servings rice. ? 1 serving corn. ? 1 serving milk. ? 1 serving strawberries. 3. Multiply each number of servings by 15 g: ? 2 servings rice x 15 g = 30 g. ? 1 serving corn x 15 g = 15 g. ? 1 serving milk x 15 g = 15 g. ? 1  serving strawberries x 15 g = 15 g. 4. Add together all of the amounts to find the total grams of carbohydrates eaten: ? 30 g + 15 g + 15 g + 15 g = 75 g of carbohydrates total. Summary  Carbohydrate counting is a method of keeping track of how many carbohydrates you eat.  Eating carbohydrates naturally increases the amount of sugar (glucose) in the blood.  Counting how many carbohydrates you eat helps keep your blood glucose within normal limits, which helps you manage your diabetes.  A diet and nutrition specialist (registered dietitian) can help you make a meal plan and calculate how many carbohydrates you should have at each meal and snack. This information is not intended to replace advice given to you by your health care provider. Make sure you discuss any questions you have with your health care provider. Document Revised: 11/22/2016 Document Reviewed: 10/12/2015 Elsevier Patient Education  2020 [employer] Inc.  

## 2019-07-01 NOTE — Progress Notes (Addendum)
BP 125/83 (BP Location: Left Arm, Patient Position: Sitting, Cuff Size: Normal)   Pulse 85   Temp 97.6 F (36.4 C) (Oral)   SpO2 97%    Subjective:    Patient ID: Katie Singh, female    DOB: 1950-06-15, 69 y.o.   MRN: [id number]  HPI: Katie Singh is a 69 y.o. female  Chief Complaint  Patient presents with  . Medication Refill    furosimide, paxil, diazipam, januvia, losartan, tramadol  90 day supplies   DIABETES Last A1C 7.7%, was started on Januvia. Currently is only medication she is taking.   Hypoglycemic episodes:no Polydipsia/polyuria: no Visual disturbance: no Chest pain: no Paresthesias: no Glucose Monitoring: no  Accucheck frequency: Not Checking  Fasting glucose:  Post prandial:  Evening:  Before meals: Taking Insulin?: no  Long acting insulin:  Short acting insulin: Blood Pressure Monitoring: not checking Retinal Examination: Not up to Date -- is due for this Foot Exam: Up to Date Pneumovax: Up to Date Influenza: Up to Date Aspirin: yes   HYPERTENSION / HYPERLIPIDEMIA Continues on Diltiazem (takes this for migraines too -- Dr. Candiss Norse told her she could stop taking, but when she tried had more headaches), Losartan, Lasix.  No current statin, reports she took in past and gave her bad side effects (cramping).  Has CPAP at home, but does not use.  Has had the machine for several years.   Satisfied with current treatment? yes Duration of hypertension: chronic BP monitoring frequency: not checking BP range:  BP medication side effects: no Duration of hyperlipidemia: chronic Cholesterol medication side effects: no Cholesterol supplements: none Medication compliance: good compliance Aspirin: yes Recent stressors: no Recurrent headaches: no Visual changes: no Palpitations: no Dyspnea: no -- does get out of breath a bit quicker Chest pain: no Lower extremity edema: no Dizzy/lightheaded: no   CHRONIC KIDNEY DISEASE Followed by nephrology, Dr.  Candiss Norse.  Last visit 03/27/2019.  CRT 2.28 and GFR 21 with K+ 5.7, BUN 38.  Losartan decreased to 25 MG and attempted to D/C Diltiazem, but this brought back headaches.  CrCl 48 at this time. CKD status: stable Medications renally dose: yes Previous renal evaluation: yes Pneumovax:  Up to Date Influenza Vaccine:  Up to Date  ANXIETY/STRESS Continues Paxil 40 MG, has been on for long time.  Has tried coming off and anxiety increases.  Takes Valium as needed, does not take every day.  Uses about 10 times a month at times.  Pt is aware of risks of benzo medication use to include increased sedation, respiratory suppression, falls, dependence and cardiovascular events.  Pt would like to continue treatment as benefit determined to outweigh risk.  PMP review shows last Valium fill on 10/07/2018 for #20 pills and last Tramadol fill on 03/20/2018 for #14 pills. Duration:stable Anxious mood: yes  Excessive worrying: no Irritability: no  Sweating: no Nausea: no Palpitations:no Hyperventilation: no Panic attacks: no Agoraphobia: no  Obscessions/compulsions: no Depressed mood: no Depression screen [facility] 2/9 07/01/2019 03/20/2018 09/17/2017 06/10/2017 03/08/2017  Decreased Interest 1 1 1 1 1   Down, Depressed, Hopeless 1 1 0 0 0  PHQ - 2 Score 2 2 1 1 1   Altered sleeping 3 0 - - -  Tired, decreased energy 3 1 - - -  Change in appetite 2 3 - - -  Feeling bad or failure about yourself  2 1 - - -  Trouble concentrating 0 0 - - -  Moving slowly or fidgety/restless 0 0 - - -  Suicidal thoughts 0 0 - - -  PHQ-9 Score 12 7 - - -  Difficult doing work/chores Not difficult at all Not difficult at all - - -   Anhedonia: no Weight changes: no Insomnia: yes hard to fall asleep -- takes Melatonin at times Hypersomnia: no Fatigue/loss of energy: no Feelings of worthlessness: no Feelings of guilt: no Impaired concentration/indecisiveness: no Suicidal ideations: no  Crying spells: no Recent Stressors/Life  Changes: yes   Relationship problems: no   Family stress: yes -- caregiver to husband   Financial stress: no    Job stress: no    Recent death/loss: no  CHRONIC PAIN  Takes Tramadol as needed, this depends on weather.  Is on Plaquenil for inflammatory arthritis, continues on Plaquenil.  Followed by Dr. Jefm Bryant with last visit 01/13/2019. Present dose:  Morphine equivalents Pain control status: stable Duration: chronic Location: jumps around -- knees, hip, back, elbows Quality: dull and aching Current Pain Level: 1/10 Previous Pain Level: moderate Breakthrough pain: no Benefit from narcotic medications: yes What Activities task can be accomplished with current medication? yes with Tramadol Interested in weaning off narcotics:no   Stool softners/OTC fiber: yes  Previous pain specialty evaluation: no Non-narcotic analgesic meds: no Narcotic contract: yes  Relevant past medical, surgical, family and social history reviewed and updated as indicated. Interim medical history since our last visit reviewed. Allergies and medications reviewed and updated.  Review of Systems  Constitutional: Negative for activity change, appetite change, diaphoresis, fatigue and fever.  Respiratory: Negative for cough, chest tightness, shortness of breath and wheezing.   Cardiovascular: Negative for chest pain, palpitations and leg swelling.  Gastrointestinal: Negative.   Endocrine: Negative for cold intolerance, heat intolerance, polydipsia, polyphagia and polyuria.  Neurological: Negative.   Psychiatric/Behavioral: Positive for sleep disturbance. Negative for decreased concentration, self-injury and suicidal ideas. The patient is nervous/anxious.     Per HPI unless specifically indicated above     Objective:    BP 125/83 (BP Location: Left Arm, Patient Position: Sitting, Cuff Size: Normal)   Pulse 85   Temp 97.6 F (36.4 C) (Oral)   SpO2 97%   Wt Readings from Last 3 Encounters:  03/20/18 287  lb 4 oz (130.3 kg)  10/31/17 282 lb (127.9 kg)  09/17/17 275 lb 9.6 oz (125 kg)    Physical Exam Vitals and nursing note reviewed.  Constitutional:      General: She is awake. She is not in acute distress.    Appearance: She is well-developed. She is morbidly obese. She is not ill-appearing.  HENT:     Head: Normocephalic.     Right Ear: Hearing normal.     Left Ear: Hearing normal.  Eyes:     General: Lids are normal.        Right eye: No discharge.        Left eye: No discharge.     Conjunctiva/sclera: Conjunctivae normal.     Pupils: Pupils are equal, round, and reactive to light.  Neck:     Thyroid: No thyromegaly.     Vascular: No carotid bruit.  Cardiovascular:     Rate and Rhythm: Normal rate and regular rhythm.     Heart sounds: Normal heart sounds. No murmur. No gallop.   Pulmonary:     Effort: Pulmonary effort is normal. No accessory muscle usage or respiratory distress.     Breath sounds: Normal breath sounds.  Abdominal:     General: Bowel sounds are normal.  Palpations: Abdomen is soft.  Musculoskeletal:     Cervical back: Normal range of motion and neck supple.     Right lower leg: No edema.     Left lower leg: No edema.  Skin:    General: Skin is warm and dry.  Neurological:     Mental Status: She is alert and oriented to person, place, and time.  Psychiatric:        Attention and Perception: Attention normal.        Mood and Affect: Mood normal.        Speech: Speech normal.        Behavior: Behavior normal. Behavior is cooperative.    Diabetic Foot Exam - Simple   Simple Foot Form Visual Inspection No deformities, no ulcerations, no other skin breakdown bilaterally: Yes Sensation Testing See comments: Yes Pulse Check Posterior Tibialis and Dorsalis pulse intact bilaterally: Yes Comments Sensation 6/10 both feet. Normal pulses and intact skin.     Results for orders placed or performed in visit on 07/01/19  Bayer DCA Hb A1c Waived    Result Value Ref Range   HB A1C (BAYER DCA - WAIVED) 5.9 <7.0 %  Microalbumin, Urine Waived  Result Value Ref Range   Microalb, Ur Waived 150 (H) 0 - 19 mg/L   Creatinine, Urine Waived 200 10 - 300 mg/dL   Microalb/Creat Ratio 30-300 (H) <30 mg/g  Comprehensive metabolic panel  Result Value Ref Range   Glucose 174 (H) 65 - 99 mg/dL   BUN 24 8 - 27 mg/dL   Creatinine, Ser 2.01 (H) 0.57 - 1.00 mg/dL   GFR calc non Af Amer 25 (L) >59 mL/min/1.73   GFR calc Af Amer 29 (L) >59 mL/min/1.73   BUN/Creatinine Ratio 12 12 - 28   Sodium 143 134 - 144 mmol/L   Potassium 3.9 3.5 - 5.2 mmol/L   Chloride 105 96 - 106 mmol/L   CO2 20 20 - 29 mmol/L   Calcium 9.3 8.7 - 10.3 mg/dL   Total Protein 7.7 6.0 - 8.5 g/dL   Albumin 4.5 3.8 - 4.8 g/dL   Globulin, Total 3.2 1.5 - 4.5 g/dL   Albumin/Globulin Ratio 1.4 1.2 - 2.2   Bilirubin Total 0.4 0.0 - 1.2 mg/dL   Alkaline Phosphatase 149 (H) 39 - 117 IU/L   AST 29 0 - 40 IU/L   ALT 37 (H) 0 - 32 IU/L  Lipid Panel w/o Chol/HDL Ratio  Result Value Ref Range   Cholesterol, Total 174 100 - 199 mg/dL   Triglycerides 105 0 - 149 mg/dL   HDL 40 >39 mg/dL   VLDL Cholesterol Cal 19 5 - 40 mg/dL   LDL Chol Calc (NIH) 115 (H) 0 - 99 mg/dL  Urine drugs of abuse scrn w alc, routine (Ref Lab)  Result Value Ref Range   Amphetamines, Urine Negative Cutoff=1000 ng/mL   Barbiturate Quant, Ur Negative Cutoff=300 ng/mL   Benzodiazepine Quant, Ur Negative Cutoff=300 ng/mL   Cannabinoid Quant, Ur Negative Cutoff=50 ng/mL   Cocaine (Metab.) Negative Cutoff=300 ng/mL   Opiate Quant, Ur Negative Cutoff=300 ng/mL   PCP Quant, Ur Negative Cutoff=25 ng/mL   Methadone Screen, Urine Negative Cutoff=300 ng/mL   Propoxyphene Negative Cutoff=300 ng/mL   Ethanol, Urine Negative Cutoff=0.020 %  TSH  Result Value Ref Range   TSH 3.820 0.450 - 4.500 uIU/mL      Assessment & Plan:   Problem List Items Addressed This Visit      Cardiovascular  and Mediastinum    Hypertension associated with diabetes ([place])    Chronic, ongoing.  BP at goal today.  Recommend she check BP at least a few mornings a week at home.  Continue current medication regimen and adjust as needed.  Obtain CMP today, may need to further reduce or discontinue Losartan if K+ remains elevated.  A1C 5.9% today.  Return in 3 months.      Relevant Medications   losartan (COZAAR) 25 MG tablet   sitaGLIPtin (JANUVIA) 25 MG tablet   diltiazem (CARDIZEM CD) 180 MG 24 hr capsule   furosemide (LASIX) 40 MG tablet   Other Relevant Orders   Bayer DCA Hb A1c Waived (Completed)   Microalbumin, Urine Waived (Completed)   Comprehensive metabolic panel (Completed)   TSH     Respiratory   Sleep apnea syndrome    Recommend use of CPAP and possible new sleep study as has not used in years, discussed at length benefit to overall health of CPAP.  She wishes to think about this.        Endocrine   Hyperlipidemia associated with type 2 diabetes mellitus (HCC)    Chronic, ongoing.  Poor tolerance of statin in past, no current medication.  Continue diet focus.  Lipid panel today.      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  A1C today 5.9%.  Continue Januvia at 25 MG, renal dosing.  Urine micro ALB 150 and A:C >300.  Continue collaboration with nephrology and review notes.  May need to consider renal dosing of Gabapentin, will review CMP upon return, but based on current CrCl 900 MG a day max dose.  Refills sent.  Return in 3 months.      Relevant Medications   losartan (COZAAR) 25 MG tablet   sitaGLIPtin (JANUVIA) 25 MG tablet   Other Relevant Orders   Bayer DCA Hb A1c Waived (Completed)   Microalbumin, Urine Waived  (Completed)   Comprehensive metabolic panel (Completed)     Musculoskeletal and Integument   Inflammatory arthritis    Chronic, ongoing.  Followed by Dr. Jefm Bryant.  Continue current Plaquenil dose.  Refills on Tramadol sent, using appropriately with last fill 03/20/2018 for #14 pills, minimal use.  Recommend to continue to minimally use this and not to take along with her Valium.  Refills sent today.  UDS obtained, contract next visit.      Relevant Medications   gabapentin (NEURONTIN) 300 MG capsule   traMADol (ULTRAM) 50 MG tablet   Drug-induced myopathy    Poor tolerance of statins -- has tried multiple.        Genitourinary   CKD (chronic kidney disease) stage 4, GFR 15-29 ml/min (HCC)    Chronic, ongoing.  Continue collaboration with nephrology and review of notes.  CMP today, may need to further adjust or discontinue Losartan dependent on K+ on these labs.          Other   Major depression, chronic    Chronic, ongoing.  Denies SI/HI.  PHQ = 12 today.  Continue Paxil at this time, however discussed at length BEERS criteria and possible transition to Sertraline in upcoming months.  Would benefit from Cymbalta,  but due to kidney function will need to avoid this.  Refills sent.  Return in 3 months.      Relevant Medications   PARoxetine (PAXIL) 40 MG tablet   diazepam (VALIUM) 5 MG tablet   Anxiety    Chronic, ongoing.  Continues on PRN Valium with minimal use, last fill 10/07/2018 for #20 pills.  Discussed at length BEERS criteria with her today and increased risks with age.  At this time she wishes to continue current regimen, but may consider change in future.  UDS today.  Obtain contract next visit.  Refills sent.  Return in 3 months.      Relevant Medications   PARoxetine (PAXIL) 40 MG tablet   diazepam (VALIUM) 5 MG tablet   Other Relevant Orders   Urine drugs of abuse scrn w alc, routine (Ref Lab) (Completed)   TSH   Long term prescription benzodiazepine use    Refer  to anxiety plan.  UDS today and obtain contract next visit.      Relevant Orders   Urine drugs of abuse scrn w alc, routine (Ref Lab) (Completed)   Morbid obesity ([place])    Recommend continued focus on healthy diet choices and regular physical activity (30 minutes 5 days a week).       Relevant Medications   sitaGLIPtin (JANUVIA) 25 MG tablet      Time: 25 minutes, >50% spent counseling on medication regimen   Follow up plan: Return in about 3 months (around 09/28/2019) for T2DM, HTN/HLD, Mood.

## 2019-07-01 NOTE — Assessment & Plan Note (Signed)
Recommend continued focus on healthy diet choices and regular physical activity (30 minutes 5 days a week).  

## 2019-07-01 NOTE — Assessment & Plan Note (Addendum)
Chronic, ongoing.  Continues on PRN Valium with minimal use, last fill 10/07/2018 for #20 pills.  Discussed at length BEERS criteria with her today and increased risks with age.  At this time she wishes to continue current regimen, but may consider change in future.  UDS today.  Obtain contract next visit.  Refills sent.  Return in 3 months.

## 2019-07-01 NOTE — Assessment & Plan Note (Signed)
Chronic, ongoing.  BP at goal today.  Recommend she check BP at least a few mornings a week at home.  Continue current medication regimen and adjust as needed.  Obtain CMP today, may need to further reduce or discontinue Losartan if K+ remains elevated.  A1C 5.9% today.  Return in 3 months.

## 2019-07-01 NOTE — Assessment & Plan Note (Signed)
Recommend use of CPAP and possible new sleep study as has not used in years, discussed at length benefit to overall health of CPAP.  She wishes to think about this.

## 2019-07-02 ENCOUNTER — Telehealth: Payer: Self-pay

## 2019-07-02 LAB — URINE DRUGS OF ABUSE SCREEN W ALC, ROUTINE (REF LAB)
Amphetamines, Urine: NEGATIVE ng/mL
Barbiturate Quant, Ur: NEGATIVE ng/mL
Benzodiazepine Quant, Ur: NEGATIVE ng/mL
Cannabinoid Quant, Ur: NEGATIVE ng/mL
Cocaine (Metab.): NEGATIVE ng/mL
Ethanol, Urine: NEGATIVE %
Methadone Screen, Urine: NEGATIVE ng/mL
Opiate Quant, Ur: NEGATIVE ng/mL
PCP Quant, Ur: NEGATIVE ng/mL
Propoxyphene: NEGATIVE ng/mL

## 2019-07-02 NOTE — Telephone Encounter (Signed)
PA Approved

## 2019-07-02 NOTE — Telephone Encounter (Signed)
Prior Authorization initiated via CoverMyMeds for Diazepam Key: [id number]

## 2019-07-03 NOTE — Progress Notes (Signed)
Contacted via MyChart Good evening Katie Singh.  Your labs have returned, with exception of TSH which is still pending and I will let you know results when return.   - Continue to show kidney disease stage 4, continue visits with your kidney doctor, would would benefit from decreasing your Gabapentin dosing 300 MG three times a day (900 MG total a day), this is recommended dose for your kidney function - Cholesterol levels are elevated on these labs. Goal LDL is <70 for stroke prevention and your number is 115.  In diabetes statin therapy is recommended. You would benefit from starting something like Crestor or Lipitor daily.  If you would like to try this let me know.  I do recommend, even if Crestor is taken once a week or three days a week, this may decrease side effects and offer benefit. Have a great evening!!

## 2019-07-04 LAB — LIPID PANEL W/O CHOL/HDL RATIO
Cholesterol, Total: 174 mg/dL (ref 100–199)
HDL: 40 mg/dL (ref >39)
LDL Chol Calc (NIH): 115 mg/dL — ABNORMAL HIGH (ref 0–99)
Triglycerides: 105 mg/dL (ref 0–149)
VLDL Cholesterol Cal: 19 mg/dL (ref 5–40)

## 2019-07-04 LAB — COMPREHENSIVE METABOLIC PANEL
ALT: 37 IU/L — ABNORMAL HIGH (ref 0–32)
AST: 29 IU/L (ref 0–40)
Albumin/Globulin Ratio: 1.4 (ref 1.2–2.2)
Albumin: 4.5 g/dL (ref 3.8–4.8)
Alkaline Phosphatase: 149 IU/L — ABNORMAL HIGH (ref 39–117)
BUN/Creatinine Ratio: 12 (ref 12–28)
BUN: 24 mg/dL (ref 8–27)
Bilirubin Total: 0.4 mg/dL (ref 0.0–1.2)
CO2: 20 mmol/L (ref 20–29)
Calcium: 9.3 mg/dL (ref 8.7–10.3)
Chloride: 105 mmol/L (ref 96–106)
Creatinine, Ser: 2.01 mg/dL — ABNORMAL HIGH (ref 0.57–1.00)
GFR calc Af Amer: 29 mL/min/{1.73_m2} — ABNORMAL LOW (ref >59)
GFR calc non Af Amer: 25 mL/min/{1.73_m2} — ABNORMAL LOW (ref >59)
Globulin, Total: 3.2 g/dL (ref 1.5–4.5)
Glucose: 174 mg/dL — ABNORMAL HIGH (ref 65–99)
Potassium: 3.9 mmol/L (ref 3.5–5.2)
Sodium: 143 mmol/L (ref 134–144)
Total Protein: 7.7 g/dL (ref 6.0–8.5)

## 2019-07-04 LAB — TSH: TSH: 3.82 u[IU]/mL (ref 0.450–4.500)

## 2019-07-31 DIAGNOSIS — Z20828 Contact with and (suspected) exposure to other viral communicable diseases: Secondary | ICD-10-CM | POA: Diagnosis not present

## 2019-07-31 DIAGNOSIS — J069 Acute upper respiratory infection, unspecified: Secondary | ICD-10-CM | POA: Diagnosis not present

## 2019-07-31 DIAGNOSIS — R5383 Other fatigue: Secondary | ICD-10-CM | POA: Diagnosis not present

## 2019-07-31 DIAGNOSIS — R0602 Shortness of breath: Secondary | ICD-10-CM | POA: Diagnosis not present

## 2019-07-31 DIAGNOSIS — Z20822 Contact with and (suspected) exposure to covid-19: Secondary | ICD-10-CM | POA: Diagnosis not present

## 2019-09-03 DIAGNOSIS — E119 Type 2 diabetes mellitus without complications: Secondary | ICD-10-CM | POA: Diagnosis not present

## 2019-09-03 DIAGNOSIS — H35389 Toxic maculopathy, unspecified eye: Secondary | ICD-10-CM | POA: Diagnosis not present

## 2019-09-03 DIAGNOSIS — T372X5A Adverse effect of antimalarials and drugs acting on other blood protozoa, initial encounter: Secondary | ICD-10-CM | POA: Diagnosis not present

## 2019-09-03 DIAGNOSIS — Z79899 Other long term (current) drug therapy: Secondary | ICD-10-CM | POA: Diagnosis not present

## 2019-09-03 DIAGNOSIS — Z961 Presence of intraocular lens: Secondary | ICD-10-CM | POA: Diagnosis not present

## 2019-09-03 LAB — HM DIABETES EYE EXAM

## 2019-09-24 DIAGNOSIS — E1122 Type 2 diabetes mellitus with diabetic chronic kidney disease: Secondary | ICD-10-CM | POA: Diagnosis not present

## 2019-09-24 DIAGNOSIS — I10 Essential (primary) hypertension: Secondary | ICD-10-CM | POA: Diagnosis not present

## 2019-09-24 DIAGNOSIS — N184 Chronic kidney disease, stage 4 (severe): Secondary | ICD-10-CM | POA: Diagnosis not present

## 2019-09-25 DIAGNOSIS — M1990 Unspecified osteoarthritis, unspecified site: Secondary | ICD-10-CM | POA: Diagnosis not present

## 2019-09-25 DIAGNOSIS — M8949 Other hypertrophic osteoarthropathy, multiple sites: Secondary | ICD-10-CM | POA: Diagnosis not present

## 2019-09-25 DIAGNOSIS — M7061 Trochanteric bursitis, right hip: Secondary | ICD-10-CM | POA: Diagnosis not present

## 2019-09-28 ENCOUNTER — Ambulatory Visit: Payer: Medicare HMO | Admitting: Nurse Practitioner

## 2019-11-04 ENCOUNTER — Encounter: Payer: Self-pay | Admitting: Nurse Practitioner

## 2019-11-04 DIAGNOSIS — G720 Drug-induced myopathy: Secondary | ICD-10-CM | POA: Insufficient documentation

## 2019-11-05 NOTE — Assessment & Plan Note (Signed)
Poor tolerance of statins -- has tried multiple.

## 2019-12-10 ENCOUNTER — Ambulatory Visit: Payer: Medicare HMO | Admitting: Nurse Practitioner

## 2019-12-11 ENCOUNTER — Ambulatory Visit: Payer: Medicare HMO | Admitting: Nurse Practitioner

## 2019-12-17 ENCOUNTER — Encounter: Payer: Self-pay | Admitting: Nurse Practitioner

## 2019-12-17 ENCOUNTER — Other Ambulatory Visit: Payer: Self-pay

## 2019-12-17 ENCOUNTER — Ambulatory Visit (INDEPENDENT_AMBULATORY_CARE_PROVIDER_SITE_OTHER): Payer: Medicare HMO | Admitting: Nurse Practitioner

## 2019-12-17 VITALS — BP 122/78 | HR 76 | Temp 98.5°F | Wt 294.2 lb

## 2019-12-17 DIAGNOSIS — I152 Hypertension secondary to endocrine disorders: Secondary | ICD-10-CM

## 2019-12-17 DIAGNOSIS — M8949 Other hypertrophic osteoarthropathy, multiple sites: Secondary | ICD-10-CM | POA: Diagnosis not present

## 2019-12-17 DIAGNOSIS — N184 Chronic kidney disease, stage 4 (severe): Secondary | ICD-10-CM

## 2019-12-17 DIAGNOSIS — E1159 Type 2 diabetes mellitus with other circulatory complications: Secondary | ICD-10-CM | POA: Diagnosis not present

## 2019-12-17 DIAGNOSIS — E1122 Type 2 diabetes mellitus with diabetic chronic kidney disease: Secondary | ICD-10-CM

## 2019-12-17 DIAGNOSIS — E785 Hyperlipidemia, unspecified: Secondary | ICD-10-CM | POA: Diagnosis not present

## 2019-12-17 DIAGNOSIS — E1169 Type 2 diabetes mellitus with other specified complication: Secondary | ICD-10-CM

## 2019-12-17 DIAGNOSIS — R0602 Shortness of breath: Secondary | ICD-10-CM

## 2019-12-17 DIAGNOSIS — I10 Essential (primary) hypertension: Secondary | ICD-10-CM | POA: Diagnosis not present

## 2019-12-17 DIAGNOSIS — G4733 Obstructive sleep apnea (adult) (pediatric): Secondary | ICD-10-CM

## 2019-12-17 DIAGNOSIS — R0600 Dyspnea, unspecified: Secondary | ICD-10-CM | POA: Insufficient documentation

## 2019-12-17 DIAGNOSIS — M159 Polyosteoarthritis, unspecified: Secondary | ICD-10-CM

## 2019-12-17 LAB — BAYER DCA HB A1C WAIVED: HB A1C (BAYER DCA - WAIVED): 6.4 % (ref <7.0)

## 2019-12-17 MED ORDER — TIZANIDINE HCL 4 MG PO TABS: 4.0000 mg | ORAL_TABLET | Freq: Four times a day (QID) | ORAL | 0 refills | Status: DC | PRN

## 2019-12-17 NOTE — Assessment & Plan Note (Signed)
Chronic, ongoing.  BP at goal today on recheck.  Recommend she check BP at least a few mornings a week at home + document for provider.  Continue current medication regimen and adjust as needed.  Obtain CMP today, may need to further reduce or discontinue Losartan if K+ elevated.  A1C 6.4% today.  Return in 2 weeks for SOB and edema.

## 2019-12-17 NOTE — Assessment & Plan Note (Signed)
Right hip OA and lumbar spine.  Recommend to continue collaboration with orthopedics, had recent injection and Prednisone.  Will send in Tizanidine to use as needed for discomfort.  Continue Gabapentin 900 MG daily at this time, renal dosing.  Recommend focus on modest weight loss via regular activity and diet changes.  She refuses PT, although this was highly recommended.

## 2019-12-17 NOTE — Assessment & Plan Note (Signed)
Chronic, ongoing.  Poor tolerance of statin in past, no current medication, refuses statin.  Continue diet focus.  Lipid panel today.

## 2019-12-17 NOTE — Assessment & Plan Note (Signed)
Recommend use of CPAP and possible new sleep study as has not used in years, discussed at length benefit to overall health of CPAP.  She wishes to think about this.

## 2019-12-17 NOTE — Assessment & Plan Note (Signed)
New diagnosis, present since June with increased stressors and primary caregiver role.  Presenting with edema BLE.  At risk for HF due to underlying co morbidities.  EKG in office today.  Will obtain labs to include iron (history of IDA), CBC, TSH, CMP, and BNP.  Order for echo placed and discussed with patient.  Referral to cardiology, has not seen heart provider in a few years and would benefit from this.  Recommend she start wearing compression hose at home, on during day and off at night.  Dependent on labs, if K+ stable may increase Lasix dose for brief period.  Return to office in 2 weeks, sooner if worsening.

## 2019-12-17 NOTE — Assessment & Plan Note (Signed)
Chronic, ongoing.  A1C today 6.4%.  Continue Januvia at 25 MG, renal dosing.  Urine micro ALB 150 and A:C >300 last visit.  Continue collaboration with nephrology and review notes.  May need to consider renal dosing of Gabapentin, will review CMP upon return, but based on current CrCl 900 MG a day max dose.  Return in 3 months.

## 2019-12-17 NOTE — Patient Instructions (Signed)
Edema  Edema is when you have too much fluid in your body or under your skin. Edema may make your legs, feet, and ankles swell up. Swelling is also common in looser tissues, like around your eyes. This is a common condition. It gets more common as you get older. There are many possible causes of edema. Eating too much salt (sodium) and being on your feet or sitting for a long time can cause edema in your legs, feet, and ankles. Hot weather may make edema worse. Edema is usually painless. Your skin may look swollen or shiny. Follow these instructions at home:  Keep the swollen body part raised (elevated) above the level of your heart when you are sitting or lying down.  Do not sit still or stand for a long time.  Do not wear tight clothes. Do not wear garters on your upper legs.  Exercise your legs. This can help the swelling go down.  Wear elastic bandages or support stockings as told by your doctor.  Eat a low-salt (low-sodium) diet to reduce fluid as told by your doctor.  Depending on the cause of your swelling, you may need to limit how much fluid you drink (fluid restriction).  Take over-the-counter and prescription medicines only as told by your doctor. Contact a doctor if:  Treatment is not working.  You have heart, liver, or kidney disease and have symptoms of edema.  You have sudden and unexplained weight gain. Get help right away if:  You have shortness of breath or chest pain.  You cannot breathe when you lie down.  You have pain, redness, or warmth in the swollen areas.  You have heart, liver, or kidney disease and get edema all of a sudden.  You have a fever and your symptoms get worse all of a sudden. Summary  Edema is when you have too much fluid in your body or under your skin.  Edema may make your legs, feet, and ankles swell up. Swelling is also common in looser tissues, like around your eyes.  Raise (elevate) the swollen body part above the level of your  heart when you are sitting or lying down.  Follow your doctor's instructions about diet and how much fluid you can drink (fluid restriction). This information is not intended to replace advice given to you by your health care provider. Make sure you discuss any questions you have with your health care provider. Document Revised: 05/03/2017 Document Reviewed: 05/18/2016 Elsevier Patient Education  2020 [employer]  

## 2019-12-17 NOTE — Assessment & Plan Note (Signed)
Recommend continued focus on healthy diet choices and regular physical activity (30 minutes 5 days a week).  

## 2019-12-17 NOTE — Assessment & Plan Note (Signed)
Chronic, ongoing.  Continue collaboration with nephrology and review of notes.  CMP today, may need to further adjust or discontinue Losartan dependent on K+ on these labs.

## 2019-12-17 NOTE — Progress Notes (Signed)
BP 122/78 (BP Location: Left Arm)   Pulse 76   Temp 98.5 F (36.9 C) (Oral)   Wt 294 lb 3.2 oz (133.4 kg)   SpO2 96%   BMI 45.74 kg/m    Subjective:    Patient ID: Katie Singh, female    DOB: 28-Sep-1950, 69 y.o.   MRN: [id number]  HPI: Katie Singh is a 69 y.o. female  Chief Complaint  Patient presents with  . Foot Swelling  . Shortness of Breath  . Hip Pain   DIABETES Last A1C 5.9%, on Januvia. Currently is only medication she is taking.   Hypoglycemic episodes:no Polydipsia/polyuria: no Visual disturbance: no Chest pain: no Paresthesias: no Glucose Monitoring: no             Accucheck frequency: Not Checking             Fasting glucose:             Post prandial:             Evening:             Before meals: Taking Insulin?: no             Long acting insulin:             Short acting insulin: Blood Pressure Monitoring: not checking Retinal Examination: Up To Date Foot Exam: Up to Date Pneumovax: Up to Date Influenza: Up to Date Aspirin: yes   HYPERTENSION / HYPERLIPIDEMIA Continues on Diltiazem (takes this for migraines too -- Dr. Candiss Norse told her she could stop taking, but when she tried had more headaches), Losartan 25 MG, Lasix 40 MG. No current statin, reports she took in past and gave her bad side effects (cramping).  Has CPAP at home, but does not use. Has had the machine for several years.    Reports having swelling to lower extremities that started June 29th after her significant other hurt himself and she had to care for him and was in hospital watching him -- sitting up and sleeping.  It is not getting better with elevation, but can not elevate for long due to caring for significant other.  Started to have SOB at the same time, prior to this had it occasionally with exertion but now is noticing it more with lying down.  Sleeps on 2 pillows at night, which is baseline for her not increased.  Last had echo in 2018 -- EF 55% -- Dr. Nehemiah Massed seen at  that time.  She does endorse history of iron deficiency anemia and having similar symptoms before with this -- no current supplements. Satisfied with current treatment? yes Duration of hypertension: chronic BP monitoring frequency: not checking BP range:  BP medication side effects: no Duration of hyperlipidemia: chronic Cholesterol medication side effects: no Cholesterol supplements: none Medication compliance: good compliance Aspirin: yes Recent stressors: no Recurrent headaches: no Visual changes: no Palpitations: no Dyspnea: since June Chest pain: no Lower extremity edema: since June Dizzy/lightheaded: no  The 10-year ASCVD risk score Mikey Bussing DC Jr., et al., 2013) is: 19.6%   Values used to calculate the score:     Age: 79 years     Sex: Female     Is Non-Hispanic African American: No     Diabetic: Yes     Tobacco smoker: No     Systolic Blood Pressure: 800 mmHg     Is BP treated: Yes     HDL Cholesterol: 40 mg/dL  Total Cholesterol: 174 mg/dL  CHRONIC KIDNEY DISEASE Followed by nephrology, Dr. Candiss Norse.  Last visit 09/24/19.  CRT 1.75 and GFR 29 with K+ 3.9, BUN 25.  Current CrCl 64 CKD status: stable Medications renally dose: yes Previous renal evaluation: yes Pneumovax:  Up to Date Influenza Vaccine:  Up to Date  HIP PAIN Had imaging in May noting joint space narrowing + lumbar spine scoliosis + significant degenerative changes L2-3.  Was given shot and 6 days of Prednisone by ortho 3 weeks ago, which has not offered much benefit.  Has not done any physical therapy, but refuses this at this time.  Currently is taking Gabapentin -- she reports taking three 300 MG tablets once a day. Duration: chronic Involved hip: right  Mechanism of injury: unknown Location: lateral Onset: gradual  Severity: 5/10  Quality: dull and aching Frequency: constant Radiation: yes Aggravating factors: walking and movement   Alleviating factors: APAP, NSAIDs and rest  Status:  fluctuating Treatments attempted: has taken occasional Meloxicam although endorses her kidney provider told her not to take Relief with NSAIDs?: moderate Weakness with weight bearing: no Weakness with walking: no Paresthesias / decreased sensation: no Swelling: no Redness:no Fevers: no  Relevant past medical, surgical, family and social history reviewed and updated as indicated. Interim medical history since our last visit reviewed. Allergies and medications reviewed and updated.  Review of Systems  Constitutional: Positive for fatigue. Negative for activity change, appetite change, diaphoresis and fever.  Respiratory: Positive for shortness of breath. Negative for cough, chest tightness and wheezing.   Cardiovascular: Positive for leg swelling. Negative for chest pain and palpitations.  Gastrointestinal: Negative.   Endocrine: Negative for polydipsia, polyphagia and polyuria.  Musculoskeletal: Positive for arthralgias.  Neurological: Negative.   Psychiatric/Behavioral: Negative.     Per HPI unless specifically indicated above     Objective:    BP 122/78 (BP Location: Left Arm)   Pulse 76   Temp 98.5 F (36.9 C) (Oral)   Wt 294 lb 3.2 oz (133.4 kg)   SpO2 96%   BMI 45.74 kg/m   Wt Readings from Last 3 Encounters:  12/17/19 294 lb 3.2 oz (133.4 kg)  03/20/18 287 lb 4 oz (130.3 kg)  10/31/17 282 lb (127.9 kg)    Physical Exam Vitals and nursing note reviewed.  Constitutional:      General: She is awake. She is not in acute distress.    Appearance: She is well-developed and well-groomed. She is morbidly obese. She is not ill-appearing.  HENT:     Head: Normocephalic.     Right Ear: Hearing normal.     Left Ear: Hearing normal.     Mouth/Throat:     Dentition: Abnormal dentition (poor dentition).  Eyes:     General: Lids are normal.        Right eye: No discharge.        Left eye: No discharge.     Conjunctiva/sclera: Conjunctivae normal.     Pupils: Pupils are  equal, round, and reactive to light.  Neck:     Thyroid: No thyromegaly.     Vascular: No carotid bruit.  Cardiovascular:     Rate and Rhythm: Normal rate and regular rhythm.     Heart sounds: Normal heart sounds. No murmur heard.  No gallop.   Pulmonary:     Effort: Pulmonary effort is normal. No accessory muscle usage or respiratory distress.     Breath sounds: Normal breath sounds.  Abdominal:  General: Bowel sounds are normal.     Palpations: Abdomen is soft.  Musculoskeletal:     Cervical back: Normal range of motion and neck supple.     Right hip: Crepitus present. No deformity, lacerations or tenderness. Decreased range of motion. Normal strength.     Left hip: Normal.     Right lower leg: 2+ Pitting Edema present.     Left lower leg: 2+ Pitting Edema present.  Lymphadenopathy:     Cervical: No cervical adenopathy.  Skin:    General: Skin is warm and dry.  Neurological:     Mental Status: She is alert and oriented to person, place, and time.  Psychiatric:        Attention and Perception: Attention normal.        Mood and Affect: Mood normal.        Speech: Speech normal.        Behavior: Behavior normal. Behavior is cooperative.    EKG in office NSR rate 70 with occasional PAC and some LVH  Results for orders placed or performed in visit on 09/03/19  HM DIABETES EYE EXAM  Result Value Ref Range   HM Diabetic Eye Exam No Retinopathy No Retinopathy      Assessment & Plan:   Problem List Items Addressed This Visit      Cardiovascular and Mediastinum   Hypertension associated with diabetes ([place])    Chronic, ongoing.  BP at goal today on recheck.  Recommend she check BP at least a few mornings a week at home + document for provider.  Continue current medication regimen and adjust as needed.  Obtain CMP today, may need to further reduce or discontinue Losartan if K+ elevated.  A1C 6.4% today.  Return in 2 weeks for SOB and edema.      Relevant Medications    aspirin 325 MG tablet   Other Relevant Orders   TSH   HgB A1c     Respiratory   Sleep apnea syndrome    Recommend use of CPAP and possible new sleep study as has not used in years, discussed at length benefit to overall health of CPAP.  She wishes to think about this.      Relevant Orders   Ambulatory referral to Cardiology     Endocrine   Hyperlipidemia associated with type 2 diabetes mellitus (HCC)    Chronic, ongoing.  Poor tolerance of statin in past, no current medication, refuses statin.  Continue diet focus.  Lipid panel today.      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  A1C today 6.4%.  Continue Januvia at 25 MG, renal dosing.  Urine micro ALB 150 and A:C >300 last visit.  Continue collaboration with nephrology and review notes.  May need to consider renal dosing of Gabapentin, will review CMP upon return, but based on current CrCl 900 MG a day max dose.  Return in 3 months.      Relevant Medications   aspirin 325 MG tablet   Other Relevant Orders   Ambulatory referral to Cardiology     Musculoskeletal and Integument   Osteoarthritis    Right hip OA and lumbar spine.  Recommend to continue collaboration with orthopedics, had recent injection and Prednisone.  Will send  in Tizanidine to use as needed for discomfort.  Continue Gabapentin 900 MG daily at this time, renal dosing.  Recommend focus on modest weight loss via regular activity and diet changes.  She refuses PT, although this was highly recommended.        Relevant Medications   aspirin 325 MG tablet   tiZANidine (ZANAFLEX) 4 MG tablet     Genitourinary   CKD (chronic kidney disease) stage 4, GFR 15-29 ml/min (HCC)    Chronic, ongoing.  Continue collaboration with nephrology and review of notes.  CMP today, may need to further adjust or  discontinue Losartan dependent on K+ on these labs.        Relevant Orders   Ambulatory referral to Cardiology     Other   Morbid obesity ([place])    Recommend continued focus on healthy diet choices and regular physical activity (30 minutes 5 days a week).       SOB (shortness of breath)    New diagnosis, present since June with increased stressors and primary caregiver role.  Presenting with edema BLE.  At risk for HF due to underlying co morbidities.  EKG in office today.  Will obtain labs to include iron (history of IDA), CBC, TSH, CMP, and BNP.  Order for echo placed and discussed with patient.  Referral to cardiology, has not seen heart provider in a few years and would benefit from this.  Recommend she start wearing compression hose at home, on during day and off at night.  Dependent on labs, if K+ stable may increase Lasix dose for brief period.  Return to office in 2 weeks, sooner if worsening.      Relevant Orders   EKG 12-Lead (Completed)   B Nat Peptide   ECHOCARDIOGRAM COMPLETE   Iron, TIBC and Ferritin Panel   CBC with Differential/Platelet   Ambulatory referral to Cardiology       Follow up plan: Return in about 2 weeks (around 12/31/2019) for SOB and EDEMA.

## 2019-12-18 ENCOUNTER — Telehealth: Payer: Self-pay | Admitting: Nurse Practitioner

## 2019-12-18 DIAGNOSIS — R0602 Shortness of breath: Secondary | ICD-10-CM

## 2019-12-18 DIAGNOSIS — R7989 Other specified abnormal findings of blood chemistry: Secondary | ICD-10-CM

## 2019-12-18 LAB — CBC WITH DIFFERENTIAL/PLATELET
Basophils Absolute: 0 10*3/uL (ref 0.0–0.2)
Basos: 1 %
EOS (ABSOLUTE): 0.3 10*3/uL (ref 0.0–0.4)
Eos: 4 %
Hematocrit: 36.3 % (ref 34.0–46.6)
Hemoglobin: 11.8 g/dL (ref 11.1–15.9)
Immature Grans (Abs): 0 10*3/uL (ref 0.0–0.1)
Immature Granulocytes: 1 %
Lymphocytes Absolute: 0.8 10*3/uL (ref 0.7–3.1)
Lymphs: 12 %
MCH: 27.9 pg (ref 26.6–33.0)
MCHC: 32.5 g/dL (ref 31.5–35.7)
MCV: 86 fL (ref 79–97)
Monocytes Absolute: 0.4 10*3/uL (ref 0.1–0.9)
Monocytes: 6 %
Neutrophils Absolute: 5.6 10*3/uL (ref 1.4–7.0)
Neutrophils: 76 %
Platelets: 274 10*3/uL (ref 150–450)
RBC: 4.23 x10E6/uL (ref 3.77–5.28)
RDW: 13 % (ref 11.7–15.4)
WBC: 7.2 10*3/uL (ref 3.4–10.8)

## 2019-12-18 LAB — COMPREHENSIVE METABOLIC PANEL
ALT: 22 IU/L (ref 0–32)
AST: 22 IU/L (ref 0–40)
Albumin/Globulin Ratio: 1.4 (ref 1.2–2.2)
Albumin: 4.3 g/dL (ref 3.8–4.8)
Alkaline Phosphatase: 154 IU/L — ABNORMAL HIGH (ref 48–121)
BUN/Creatinine Ratio: 14 (ref 12–28)
BUN: 28 mg/dL — ABNORMAL HIGH (ref 8–27)
Bilirubin Total: 0.4 mg/dL (ref 0.0–1.2)
CO2: 23 mmol/L (ref 20–29)
Calcium: 9.3 mg/dL (ref 8.7–10.3)
Chloride: 104 mmol/L (ref 96–106)
Creatinine, Ser: 2.02 mg/dL — ABNORMAL HIGH (ref 0.57–1.00)
GFR calc Af Amer: 28 mL/min/{1.73_m2} — ABNORMAL LOW (ref >59)
GFR calc non Af Amer: 25 mL/min/{1.73_m2} — ABNORMAL LOW (ref >59)
Globulin, Total: 3 g/dL (ref 1.5–4.5)
Glucose: 113 mg/dL — ABNORMAL HIGH (ref 65–99)
Potassium: 4.1 mmol/L (ref 3.5–5.2)
Sodium: 143 mmol/L (ref 134–144)
Total Protein: 7.3 g/dL (ref 6.0–8.5)

## 2019-12-18 LAB — IRON,TIBC AND FERRITIN PANEL
Ferritin: 46 ng/mL (ref 15–150)
Iron Saturation: 11 % — ABNORMAL LOW (ref 15–55)
Iron: 43 ug/dL (ref 27–139)
Total Iron Binding Capacity: 390 ug/dL (ref 250–450)
UIBC: 347 ug/dL (ref 118–369)

## 2019-12-18 LAB — LIPID PANEL W/O CHOL/HDL RATIO
Cholesterol, Total: 148 mg/dL (ref 100–199)
HDL: 33 mg/dL — ABNORMAL LOW (ref >39)
LDL Chol Calc (NIH): 94 mg/dL (ref 0–99)
Triglycerides: 114 mg/dL (ref 0–149)
VLDL Cholesterol Cal: 21 mg/dL (ref 5–40)

## 2019-12-18 LAB — BRAIN NATRIURETIC PEPTIDE: BNP: 102.1 pg/mL — ABNORMAL HIGH (ref 0.0–100.0)

## 2019-12-18 LAB — TSH: TSH: 4.97 u[IU]/mL — ABNORMAL HIGH (ref 0.450–4.500)

## 2019-12-18 NOTE — Telephone Encounter (Signed)
Copied from [place] [id number]. Topic: General - Other >> Dec 18, 2019  1:56 PM Gillis Ends D wrote: Reason for CRM: Patient states that she missed a call from the office and she was returning the call. She statews that she had some labs on yesterday and it could have been in reference to that. She asks that the call be returned. Please advise.  Looked in chart unable to find note to see who called pt.

## 2019-12-18 NOTE — Progress Notes (Signed)
Review telephone encounter on 12/18/19

## 2019-12-18 NOTE — Telephone Encounter (Signed)
Spoke to patient on telephone to review lab results.  TSH mildly elevated, will recheck next visit along with a Free T4.  Kidney function remains at her baseline.  CBC shows no anemia.  BNP is mildly elevated, discussed with her.  An echo has been ordered.  Will place referral to cardiology for further work-up due to SOB, edema, and some orthopnea.  Suspect some HF may be present.  Have recommend she increase Lasix for 3 days to 60 MG.  She was agreeable to plan and able to verbalize back.

## 2019-12-21 NOTE — Telephone Encounter (Signed)
Unable to see who or documentation on who called Pt.

## 2020-01-07 ENCOUNTER — Ambulatory Visit (INDEPENDENT_AMBULATORY_CARE_PROVIDER_SITE_OTHER): Payer: Medicare HMO | Admitting: Nurse Practitioner

## 2020-01-07 ENCOUNTER — Other Ambulatory Visit: Payer: Self-pay

## 2020-01-07 ENCOUNTER — Encounter: Payer: Self-pay | Admitting: Nurse Practitioner

## 2020-01-07 VITALS — BP 138/81 | HR 79 | Temp 97.8°F | Wt 286.6 lb

## 2020-01-07 DIAGNOSIS — N184 Chronic kidney disease, stage 4 (severe): Secondary | ICD-10-CM

## 2020-01-07 DIAGNOSIS — R7989 Other specified abnormal findings of blood chemistry: Secondary | ICD-10-CM | POA: Diagnosis not present

## 2020-01-07 DIAGNOSIS — F329 Major depressive disorder, single episode, unspecified: Secondary | ICD-10-CM | POA: Diagnosis not present

## 2020-01-07 DIAGNOSIS — F419 Anxiety disorder, unspecified: Secondary | ICD-10-CM

## 2020-01-07 DIAGNOSIS — R0602 Shortness of breath: Secondary | ICD-10-CM

## 2020-01-07 DIAGNOSIS — M1990 Unspecified osteoarthritis, unspecified site: Secondary | ICD-10-CM

## 2020-01-07 DIAGNOSIS — R69 Illness, unspecified: Secondary | ICD-10-CM | POA: Diagnosis not present

## 2020-01-07 MED ORDER — TRAMADOL HCL 50 MG PO TABS: 50.0000 mg | ORAL_TABLET | Freq: Two times a day (BID) | ORAL | 0 refills | Status: DC | PRN

## 2020-01-07 MED ORDER — DIAZEPAM 5 MG PO TABS: 5.0000 mg | ORAL_TABLET | Freq: Every day | ORAL | 0 refills | Status: DC | PRN

## 2020-01-07 MED ORDER — PREGABALIN 25 MG PO CAPS
25.0000 mg | ORAL_CAPSULE | Freq: Two times a day (BID) | ORAL | 5 refills | Status: DC
Start: 2020-01-07 — End: 2020-06-29

## 2020-01-07 NOTE — Assessment & Plan Note (Addendum)
Ongoing.  Present since June with increased stressors and primary caregiver role.  At risk for HF due to underlying co morbidities.  EKG in office today with left axis deviation, PAC.  Will repeat labs today, BNP elevated mildly on recent labs. She is scheduled with cardiology and echo upcoming -- 01/21/20.  Recommend she start wearing compression hose at home, on during day and off at night. Continue Lasix daily.  Could consider BB, will await cardiology recommendations.  Recommend she: - Reminded to call for an overnight weight gain of >2 pounds or a weekly weight weight of >5 pounds - not adding salt to his food and has been reading food labels. Reviewed the importance of keeping daily sodium intake to 2000mg  daily

## 2020-01-07 NOTE — Assessment & Plan Note (Signed)
Chronic, ongoing.  Continue collaboration with nephrology and review of notes.  BMP today.

## 2020-01-07 NOTE — Assessment & Plan Note (Signed)
Recommend continued focus on healthy diet choices and regular physical activity (30 minutes 5 days a week).  

## 2020-01-07 NOTE — Assessment & Plan Note (Signed)
Chronic, ongoing.  Continues on PRN Valium with minimal use, last fill 07/01/19 for #20 pills.  Discussed at length BEERS criteria with her today and increased risks with age.  At this time she wishes to continue current regimen, but may consider change in future.  UDS obtained in February.  Obtain contract next visit.  Refills sent.  Return in 3 months.

## 2020-01-07 NOTE — Assessment & Plan Note (Signed)
Right hip OA and lumbar spine.  Recommend to continue collaboration with orthopedics.  Discussed at length her Gabapentin and need to further renal dose this based on recent labs vs change to Lyrica renal dosed -- she wishes to trial Lyrica and would like short fill on her Tramadol, she is aware of risks of opioid use and that office does not perform chronic pain management.  Aware to sparsely use, short fill sent.  Recommend focus on modest weight loss via regular activity and diet changes.  She refuses PT, although this was highly recommended.

## 2020-01-07 NOTE — Assessment & Plan Note (Signed)
Noted on recent labs, will recheck TSH and Free T4 today,  Initiate medication as needed.

## 2020-01-07 NOTE — Assessment & Plan Note (Signed)
Chronic, ongoing.  Denies SI/HI.  PHQ = 5 today.  Continue Paxil at this time, however discussed at length BEERS criteria and possible transition to Sertraline in upcoming months.  Would benefit from Cymbalta, but due to kidney function will need to avoid this.  Return in 3 months.

## 2020-01-07 NOTE — Patient Instructions (Addendum)
-   Reminded to call for an overnight weight gain of >2 pounds or a weekly weight weight of >5 pounds - not adding salt to his food and has been reading food labels. Reviewed the importance of keeping daily sodium intake to 2000mg  daily    Edema  Edema is when you have too much fluid in your body or under your skin. Edema may make your legs, feet, and ankles swell up. Swelling is also common in looser tissues, like around your eyes. This is a common condition. It gets more common as you get older. There are many possible causes of edema. Eating too much salt (sodium) and being on your feet or sitting for a long time can cause edema in your legs, feet, and ankles. Hot weather may make edema worse. Edema is usually painless. Your skin may look swollen or shiny. Follow these instructions at home:  Keep the swollen body part raised (elevated) above the level of your heart when you are sitting or lying down.  Do not sit still or stand for a long time.  Do not wear tight clothes. Do not wear garters on your upper legs.  Exercise your legs. This can help the swelling go down.  Wear elastic bandages or support stockings as told by your doctor.  Eat a low-salt (low-sodium) diet to reduce fluid as told by your doctor.  Depending on the cause of your swelling, you may need to limit how much fluid you drink (fluid restriction).  Take over-the-counter and prescription medicines only as told by your doctor. Contact a doctor if:  Treatment is not working.  You have heart, liver, or kidney disease and have symptoms of edema.  You have sudden and unexplained weight gain. Get help right away if:  You have shortness of breath or chest pain.  You cannot breathe when you lie down.  You have pain, redness, or warmth in the swollen areas.  You have heart, liver, or kidney disease and get edema all of a sudden.  You have a fever and your symptoms get worse all of a sudden. Summary  Edema is when  you have too much fluid in your body or under your skin.  Edema may make your legs, feet, and ankles swell up. Swelling is also common in looser tissues, like around your eyes.  Raise (elevate) the swollen body part above the level of your heart when you are sitting or lying down.  Follow your doctor's instructions about diet and how much fluid you can drink (fluid restriction). This information is not intended to replace advice given to you by your health care provider. Make sure you discuss any questions you have with your health care provider. Document Revised: 05/03/2017 Document Reviewed: 05/18/2016 Elsevier Patient Education  2020 [employer].

## 2020-01-07 NOTE — Progress Notes (Signed)
BP 138/81   Pulse 79   Temp 97.8 F (36.6 C) (Oral)   Wt 286 lb 9.6 oz (130 kg)   SpO2 97%   BMI 44.56 kg/m    Subjective:    Patient ID: Katie Singh, female    DOB: [birth year], 69 y.o.   MRN: [id number]  HPI: Katie Singh is a 69 y.o. female  Chief Complaint  Patient presents with  . Edema    pt states she is still swelling some. States she just feels bad all over  . Anxiety    pt requesting refill on Valium to keep on hand incase it is needed   EDEMA: Follow-up for edema, seen on 12/17/19 for this.  Labs noted a BNP of 102 at that visit.  A1C 6.4%, TSH slightly elevated at 4.970, CRT 2.02 and GFR 25 (sees nephrology).  An echo was ordered, seeing cardiology on 01/21/20 and echo scheduled same day.    Reports having swelling to lower extremities that started June 29th after her significant other hurt himself and she had to care for him and was in hospital watching him -- sitting up and sleeping.  It is not getting better with elevation, but can not elevate for long due to caring for significant other.  Started to have SOB at the same time, prior to this had it occasionally with exertion but now is noticing it more with lying down.  SOB noticed it walking into office today and is also noticing it with sitting.  Sleeps on 2 pillows at night, which is baseline for her not increased.  Last had echo in 2018 -- EF 55% -- Dr. Nehemiah Massed seen at that time.  She does endorse history of iron deficiency anemia and having similar symptoms before with this -- no current supplements -- her H/H was stable on recent labs and iron 43. Satisfied with current treatment?yes Duration of hypertension:chronic BP monitoring frequency:not checking BP range:  BP medication side effects:no Duration of hyperlipidemia:chronic Cholesterol medication side effects:no Cholesterol supplements: none Medication compliance:good compliance Aspirin:yes Recent stressors:no Recurrent headaches:no Visual  changes:no Palpitations:occasional Dyspnea:since June Chest pain:no Lower extremity edema:since June  Dizzy/lightheaded:no  ANXIETY/STRESS Continues Paxil 40 MG, has been on for long time.  Has tried coming off and anxiety increases.  Takes Valium as needed, does not take every day.  Uses about 10 times a month at times.  Pt is aware of risks of benzo medication use to include increased sedation, respiratory suppression, falls, dependence and cardiovascular events.  Pt would like to continue treatment as benefit determined to outweigh risk.  PMP review shows last Valium fill on 07/01/19 for #20 pills and last Tramadol fill on 07/01/19 for #20 pills.    Uses both sparsely.  Also takes Gabapentin 300 MG QID for neuropathy -- reviewed today and discussed with her options due to her kidney function -- can reduced Gabapentin to 300 MG BID (her current renal dose) or try Lyrica 25 MG BID. Duration:stable Anxious mood: yes  Excessive worrying: no Irritability: no  Sweating: no Nausea: no Palpitations:no Hyperventilation: no Panic attacks: no Agoraphobia: no  Obscessions/compulsions: no Depressed mood: no Depression screen [facility] 2/9 01/07/2020 07/01/2019 03/20/2018 09/17/2017 06/10/2017  Decreased Interest 0 1 1 1 1   Down, Depressed, Hopeless 0 1 1 0 0  PHQ - 2 Score 0 2 2 1 1   Altered sleeping 0 3 0 - -  Tired, decreased energy 3 3 1  - -  Change in appetite 0 2  3 - -  Feeling bad or failure about yourself  1 2 1  - -  Trouble concentrating 1 0 0 - -  Moving slowly or fidgety/restless 0 0 0 - -  Suicidal thoughts 0 0 0 - -  PHQ-9 Score 5 12 7  - -  Difficult doing work/chores Not difficult at all Not difficult at all Not difficult at all - -   GAD 7 : Generalized Anxiety Score 01/07/2020  Nervous, Anxious, on Edge 1  Control/stop worrying 0  Worry too much - different things 0  Trouble relaxing 0  Restless 0  Easily annoyed or irritable 0  Afraid - awful might happen 0  Total GAD 7  Score 1  Anxiety Difficulty Not difficult at all     Relevant past medical, surgical, family and social history reviewed and updated as indicated. Interim medical history since our last visit reviewed. Allergies and medications reviewed and updated.  Review of Systems  Constitutional: Positive for fatigue. Negative for activity change, appetite change, diaphoresis and fever.  Respiratory: Positive for shortness of breath. Negative for cough, chest tightness and wheezing.   Cardiovascular: Positive for leg swelling. Negative for chest pain and palpitations.  Gastrointestinal: Negative.   Endocrine: Negative for polydipsia, polyphagia and polyuria.  Musculoskeletal: Positive for arthralgias.  Neurological: Negative.   Psychiatric/Behavioral: Negative.     Per HPI unless specifically indicated above     Objective:    BP 138/81   Pulse 79   Temp 97.8 F (36.6 C) (Oral)   Wt 286 lb 9.6 oz (130 kg)   SpO2 97%   BMI 44.56 kg/m   Wt Readings from Last 3 Encounters:  01/07/20 286 lb 9.6 oz (130 kg)  12/17/19 294 lb 3.2 oz (133.4 kg)  03/20/18 287 lb 4 oz (130.3 kg)    Physical Exam Vitals and nursing note reviewed.  Constitutional:      General: She is awake. She is not in acute distress.    Appearance: She is well-developed and well-groomed. She is morbidly obese. She is not ill-appearing.  HENT:     Head: Normocephalic.     Right Ear: Hearing normal.     Left Ear: Hearing normal.     Mouth/Throat:     Dentition: Abnormal dentition (poor dentition).  Eyes:     General: Lids are normal.        Right eye: No discharge.        Left eye: No discharge.     Conjunctiva/sclera: Conjunctivae normal.     Pupils: Pupils are equal, round, and reactive to light.  Neck:     Thyroid: No thyromegaly.     Vascular: No carotid bruit.  Cardiovascular:     Rate and Rhythm: Normal rate. Rhythm irregularly irregular.     Heart sounds: Normal heart sounds. No murmur heard.  No gallop.     Pulmonary:     Effort: Pulmonary effort is normal. No accessory muscle usage or respiratory distress.     Breath sounds: Normal breath sounds.  Abdominal:     General: Bowel sounds are normal.     Palpations: Abdomen is soft.  Musculoskeletal:     Cervical back: Normal range of motion and neck supple.     Right hip: Crepitus present. No deformity, lacerations or tenderness. Decreased range of motion. Normal strength.     Left hip: Normal.     Right lower leg: 2+ Pitting Edema present.     Left lower leg: 2+  Pitting Edema present.  Lymphadenopathy:     Cervical: No cervical adenopathy.  Skin:    General: Skin is warm and dry.  Neurological:     Mental Status: She is alert and oriented to person, place, and time.  Psychiatric:        Attention and Perception: Attention normal.        Mood and Affect: Mood normal.        Speech: Speech normal.        Behavior: Behavior normal. Behavior is cooperative.    EKG today noted left axis deviation with HR 76 with PAC.  Results for orders placed or performed in visit on 12/17/19  Comprehensive metabolic panel  Result Value Ref Range   Glucose 113 (H) 65 - 99 mg/dL   BUN 28 (H) 8 - 27 mg/dL   Creatinine, Ser 2.02 (H) 0.57 - 1.00 mg/dL   GFR calc non Af Amer 25 (L) >59 mL/min/1.73   GFR calc Af Amer 28 (L) >59 mL/min/1.73   BUN/Creatinine Ratio 14 12 - 28   Sodium 143 134 - 144 mmol/L   Potassium 4.1 3.5 - 5.2 mmol/L   Chloride 104 96 - 106 mmol/L   CO2 23 20 - 29 mmol/L   Calcium 9.3 8.7 - 10.3 mg/dL   Total Protein 7.3 6.0 - 8.5 g/dL   Albumin 4.3 3.8 - 4.8 g/dL   Globulin, Total 3.0 1.5 - 4.5 g/dL   Albumin/Globulin Ratio 1.4 1.2 - 2.2   Bilirubin Total 0.4 0.0 - 1.2 mg/dL   Alkaline Phosphatase 154 (H) 48 - 121 IU/L   AST 22 0 - 40 IU/L   ALT 22 0 - 32 IU/L  Lipid Panel w/o Chol/HDL Ratio  Result Value Ref Range   Cholesterol, Total 148 100 - 199 mg/dL   Triglycerides 114 0 - 149 mg/dL   HDL 33 (L) >39 mg/dL   VLDL  Cholesterol Cal 21 5 - 40 mg/dL   LDL Chol Calc (NIH) 94 0 - 99 mg/dL  TSH  Result Value Ref Range   TSH 4.970 (H) 0.450 - 4.500 uIU/mL  B Nat Peptide  Result Value Ref Range   BNP 102.1 (H) 0.0 - 100.0 pg/mL  Bayer DCA Hb A1c Waived  Result Value Ref Range   HB A1C (BAYER DCA - WAIVED) 6.4 <7.0 %  Iron, TIBC and Ferritin Panel  Result Value Ref Range   Total Iron Binding Capacity 390 250 - 450 ug/dL   UIBC 347 118 - 369 ug/dL   Iron 43 27 - 139 ug/dL   Iron Saturation 11 (L) 15 - 55 %   Ferritin 46 15.0 - 150.0 ng/mL  CBC with Differential/Platelet  Result Value Ref Range   WBC 7.2 3.4 - 10.8 x10E3/uL   RBC 4.23 3.77 - 5.28 x10E6/uL   Hemoglobin 11.8 11.1 - 15.9 g/dL   Hematocrit 36.3 34.0 - 46.6 %   MCV 86 79 - 97 fL   MCH 27.9 26.6 - 33.0 pg   MCHC 32.5 31 - 35 g/dL   RDW 13.0 11.7 - 15.4 %   Platelets 274 150 - 450 x10E3/uL   Neutrophils 76 Not Estab. %   Lymphs 12 Not Estab. %   Monocytes 6 Not Estab. %   Eos 4 Not Estab. %   Basos 1 Not Estab. %   Neutrophils Absolute 5.6 1 - 7 x10E3/uL   Lymphocytes Absolute 0.8 0 - 3 x10E3/uL   Monocytes Absolute 0.4 0 -  0 x10E3/uL   EOS (ABSOLUTE) 0.3 0.0 - 0.4 x10E3/uL   Basophils Absolute 0.0 0 - 0 x10E3/uL   Immature Granulocytes 1 Not Estab. %   Immature Grans (Abs) 0.0 0.0 - 0.1 x10E3/uL      Assessment & Plan:   Problem List Items Addressed This Visit      Musculoskeletal and Integument   Osteoarthritis    Right hip OA and lumbar spine.  Recommend to continue collaboration with orthopedics.  Discussed at length her Gabapentin and need to further renal dose this based on recent labs vs change to Lyrica renal dosed -- she wishes to trial Lyrica and would like short fill on her Tramadol, she is aware of risks of opioid use and that office does not perform chronic pain management.  Aware to sparsely use, short fill sent.  Recommend focus on modest weight loss via regular activity and diet changes.  She refuses PT, although  this was highly recommended.        Relevant Medications   traMADol (ULTRAM) 50 MG tablet     Genitourinary   CKD (chronic kidney disease) stage 4, GFR 15-29 ml/min (HCC) - Primary    Chronic, ongoing.  Continue collaboration with nephrology and review of notes.  BMP today.        Other   Major depression, chronic    Chronic, ongoing.  Denies SI/HI.  PHQ = 5 today.  Continue Paxil at this time, however discussed at length BEERS criteria and possible transition to Sertraline in upcoming months.  Would benefit from Cymbalta, but due to kidney function will need to avoid this.  Return in 3 months.      Relevant Medications   diazepam (VALIUM) 5 MG tablet   Anxiety    Chronic, ongoing.  Continues on PRN Valium with minimal use, last fill 07/01/19 for #20 pills.  Discussed at length BEERS criteria with her today and increased risks with age.  At this time she wishes to continue current regimen, but may consider change in future.  UDS obtained in February.  Obtain contract next visit.  Refills sent.  Return in 3 months.      Relevant Medications   diazepam (VALIUM) 5 MG tablet   Morbid obesity ([place])    Recommend continued focus on healthy diet choices and regular physical activity (30 minutes 5 days a week).       SOB (shortness of breath)    Ongoing.  Present since June with increased stressors and primary caregiver role.  At risk for HF due to underlying co morbidities.  EKG in office today with left axis deviation, PAC.  Will repeat labs today, BNP elevated mildly on recent labs. She is scheduled with cardiology and echo upcoming -- 01/21/20.  Recommend she start wearing compression hose at home, on during day and off at night. Continue Lasix daily.  Could consider BB, will await cardiology recommendations.  Recommend she: - Reminded to call for an overnight weight gain of >2 pounds or a weekly weight weight of >5 pounds - not adding salt to his food and has been reading food labels.  Reviewed the importance of keeping daily sodium intake to 2000mg  daily       Relevant Orders   EKG 12-Lead (Completed)   B Nat Peptide   Basic metabolic panel   Elevated TSH    Noted on recent labs, will recheck TSH and Free T4 today,  Initiate medication as needed.      Relevant  Orders   TSH   T4, free       Follow up plan: Return in about 3 weeks (around 01/28/2020) for Edema .

## 2020-01-08 ENCOUNTER — Telehealth: Payer: Self-pay

## 2020-01-08 LAB — BASIC METABOLIC PANEL
BUN/Creatinine Ratio: 13 (ref 12–28)
BUN: 24 mg/dL (ref 8–27)
CO2: 24 mmol/L (ref 20–29)
Calcium: 9.3 mg/dL (ref 8.7–10.3)
Chloride: 103 mmol/L (ref 96–106)
Creatinine, Ser: 1.9 mg/dL — ABNORMAL HIGH (ref 0.57–1.00)
GFR calc Af Amer: 31 mL/min/{1.73_m2} — ABNORMAL LOW (ref >59)
GFR calc non Af Amer: 27 mL/min/{1.73_m2} — ABNORMAL LOW (ref >59)
Glucose: 119 mg/dL — ABNORMAL HIGH (ref 65–99)
Potassium: 4.4 mmol/L (ref 3.5–5.2)
Sodium: 141 mmol/L (ref 134–144)

## 2020-01-08 LAB — BRAIN NATRIURETIC PEPTIDE: BNP: 93 pg/mL (ref 0.0–100.0)

## 2020-01-08 LAB — T4, FREE: Free T4: 1.17 ng/dL (ref 0.82–1.77)

## 2020-01-08 LAB — TSH: TSH: 3.37 u[IU]/mL (ref 0.450–4.500)

## 2020-01-08 NOTE — Telephone Encounter (Signed)
PA approved.

## 2020-01-08 NOTE — Progress Notes (Signed)
Please let Katie Singh know thyroid labs are normal this check and BNP, heart failure assessment, has trended down some.  This is good news, but still keep appointment with cardiology and obtain echocardiogram.  Thank you.

## 2020-01-08 NOTE — Telephone Encounter (Signed)
PA for Lyrica initiated and submitted via Cover My Meds. Key: [id number]

## 2020-01-08 NOTE — Telephone Encounter (Signed)
PA for Diazepam initiated and submitted via Cover My Meds. Key: [id number]

## 2020-01-14 DIAGNOSIS — M109 Gout, unspecified: Secondary | ICD-10-CM | POA: Diagnosis not present

## 2020-01-14 DIAGNOSIS — M7061 Trochanteric bursitis, right hip: Secondary | ICD-10-CM | POA: Diagnosis not present

## 2020-01-14 DIAGNOSIS — N1832 Chronic kidney disease, stage 3b: Secondary | ICD-10-CM | POA: Diagnosis not present

## 2020-01-14 DIAGNOSIS — G8929 Other chronic pain: Secondary | ICD-10-CM | POA: Diagnosis not present

## 2020-01-14 DIAGNOSIS — M545 Low back pain: Secondary | ICD-10-CM | POA: Diagnosis not present

## 2020-01-15 ENCOUNTER — Ambulatory Visit (INDEPENDENT_AMBULATORY_CARE_PROVIDER_SITE_OTHER): Payer: Medicare HMO

## 2020-01-15 VITALS — Ht 68.0 in | Wt 286.0 lb

## 2020-01-15 DIAGNOSIS — Z0000 Encounter for general adult medical examination without abnormal findings: Secondary | ICD-10-CM

## 2020-01-15 NOTE — Patient Instructions (Signed)
Katie Singh , Thank you for taking time to come for your Medicare Wellness Visit. I appreciate your ongoing commitment to your health goals. Please review the following plan we discussed and let me know if I can assist you in the future.   Screening recommendations/referrals: Colonoscopy: completed 03/15/2010 Mammogram: due Bone Density: completed 11/09/2010 Recommended yearly ophthalmology/optometry visit for glaucoma screening and checkup Recommended yearly dental visit for hygiene and checkup  Vaccinations: Influenza vaccine: due Pneumococcal vaccine: completed 08/01/2016 Tdap vaccine: completed 09/23/2013 Shingles vaccine: discussed   Covid-19: 08/26/2019, 08/05/2019  Advanced directives: Advance directive discussed with you today.   Conditions/risks identified: none  Next appointment: Follow up in one year for your annual wellness visit    Preventive Care 70 Years and Older, Female Preventive care refers to lifestyle choices and visits with your health care provider that can promote health and wellness. What does preventive care include?  A yearly physical exam. This is also called an annual well check.  Dental exams once or twice a year.  Routine eye exams. Ask your health care provider how often you should have your eyes checked.  Personal lifestyle choices, including:  Daily care of your teeth and gums.  Regular physical activity.  Eating a healthy diet.  Avoiding tobacco and drug use.  Limiting alcohol use.  Practicing safe sex.  Taking low-dose aspirin every day.  Taking vitamin and mineral supplements as recommended by your health care provider. What happens during an annual well check? The services and screenings done by your health care provider during your annual well check will depend on your age, overall health, lifestyle risk factors, and family history of disease. Counseling  Your health care provider may ask you questions about your:  Alcohol  use.  Tobacco use.  Drug use.  Emotional well-being.  Home and relationship well-being.  Sexual activity.  Eating habits.  History of falls.  Memory and ability to understand (cognition).  Work and work Statistician.  Reproductive health. Screening  You may have the following tests or measurements:  Height, weight, and BMI.  Blood pressure.  Lipid and cholesterol levels. These may be checked every 5 years, or more frequently if you are over 72 years old.  Skin check.  Lung cancer screening. You may have this screening every year starting at age 44 if you have a 30-pack-year history of smoking and currently smoke or have quit within the past 15 years.  Fecal occult blood test (FOBT) of the stool. You may have this test every year starting at age 58.  Flexible sigmoidoscopy or colonoscopy. You may have a sigmoidoscopy every 5 years or a colonoscopy every 10 years starting at age 31.  Hepatitis C blood test.  Hepatitis B blood test.  Sexually transmitted disease (STD) testing.  Diabetes screening. This is done by checking your blood sugar (glucose) after you have not eaten for a while (fasting). You may have this done every 70-1 years.  Bone density scan. This is done to screen for osteoporosis. You may have this done starting at age 70.  Mammogram. This may be done every 1-2 years. Talk to your health care provider about how often you should have regular mammograms. Talk with your health care provider about your test results, treatment options, and if necessary, the need for more tests. Vaccines  Your health care provider may recommend certain vaccines, such as:  Influenza vaccine. This is recommended every year.  Tetanus, diphtheria, and acellular pertussis (Tdap, Td) vaccine. You may need  a Td booster every 10 years.  Zoster vaccine. You may need this after age 70.  Pneumococcal 13-valent conjugate (PCV13) vaccine. One dose is recommended after age  70.  Pneumococcal polysaccharide (PPSV23) vaccine. One dose is recommended after age 70. Talk to your health care provider about which screenings and vaccines you need and how often you need them. This information is not intended to replace advice given to you by your health care provider. Make sure you discuss any questions you have with your health care provider. Document Released: 05/27/2015 Document Revised: 01/18/2016 Document Reviewed: 03/01/2015 Elsevier Interactive Patient Education  2017 [place] Prevention in the Home Falls can cause injuries. They can happen to people of all ages. There are many things you can do to make your home safe and to help prevent falls. What can I do on the outside of my home?  Regularly fix the edges of walkways and driveways and fix any cracks.  Remove anything that might make you trip as you walk through a door, such as a raised step or threshold.  Trim any bushes or trees on the path to your home.  Use bright outdoor lighting.  Clear any walking paths of anything that might make someone trip, such as rocks or tools.  Regularly check to see if handrails are loose or broken. Make sure that both sides of any steps have handrails.  Any raised decks and porches should have guardrails on the edges.  Have any leaves, snow, or ice cleared regularly.  Use sand or salt on walking paths during winter.  Clean up any spills in your garage right away. This includes oil or grease spills. What can I do in the bathroom?  Use night lights.  Install grab bars by the toilet and in the tub and shower. Do not use towel bars as grab bars.  Use non-skid mats or decals in the tub or shower.  If you need to sit down in the shower, use a plastic, non-slip stool.  Keep the floor dry. Clean up any water that spills on the floor as soon as it happens.  Remove soap buildup in the tub or shower regularly.  Attach bath mats securely with double-sided  non-slip rug tape.  Do not have throw rugs and other things on the floor that can make you trip. What can I do in the bedroom?  Use night lights.  Make sure that you have a light by your bed that is easy to reach.  Do not use any sheets or blankets that are too big for your bed. They should not hang down onto the floor.  Have a firm chair that has side arms. You can use this for support while you get dressed.  Do not have throw rugs and other things on the floor that can make you trip. What can I do in the kitchen?  Clean up any spills right away.  Avoid walking on wet floors.  Keep items that you use a lot in easy-to-reach places.  If you need to reach something above you, use a strong step stool that has a grab bar.  Keep electrical cords out of the way.  Do not use floor polish or wax that makes floors slippery. If you must use wax, use non-skid floor wax.  Do not have throw rugs and other things on the floor that can make you trip. What can I do with my stairs?  Do not leave any items on the  stairs.  Make sure that there are handrails on both sides of the stairs and use them. Fix handrails that are broken or loose. Make sure that handrails are as long as the stairways.  Check any carpeting to make sure that it is firmly attached to the stairs. Fix any carpet that is loose or worn.  Avoid having throw rugs at the top or bottom of the stairs. If you do have throw rugs, attach them to the floor with carpet tape.  Make sure that you have a light switch at the top of the stairs and the bottom of the stairs. If you do not have them, ask someone to add them for you. What else can I do to help prevent falls?  Wear shoes that:  Do not have high heels.  Have rubber bottoms.  Are comfortable and fit you well.  Are closed at the toe. Do not wear sandals.  If you use a stepladder:  Make sure that it is fully opened. Do not climb a closed stepladder.  Make sure that both  sides of the stepladder are locked into place.  Ask someone to hold it for you, if possible.  Clearly mark and make sure that you can see:  Any grab bars or handrails.  First and last steps.  Where the edge of each step is.  Use tools that help you move around (mobility aids) if they are needed. These include:  Canes.  Walkers.  Scooters.  Crutches.  Turn on the lights when you go into a dark area. Replace any light bulbs as soon as they burn out.  Set up your furniture so you have a clear path. Avoid moving your furniture around.  If any of your floors are uneven, fix them.  If there are any pets around you, be aware of where they are.  Review your medicines with your doctor. Some medicines can make you feel dizzy. This can increase your chance of falling. Ask your doctor what other things that you can do to help prevent falls. This information is not intended to replace advice given to you by your health care provider. Make sure you discuss any questions you have with your health care provider. Document Released: 02/24/2009 Document Revised: 10/06/2015 Document Reviewed: 06/04/2014 Elsevier Interactive Patient Education  2017 [employer].

## 2020-01-15 NOTE — Progress Notes (Signed)
I connected with Amar Keenum today by telephone and verified that I am speaking with the correct person using two identifiers. Location patient: home Location provider: work Persons participating in the virtual visit: Marisella Puccio, Glenna Durand LPN.   I discussed the limitations, risks, security and privacy concerns of performing an evaluation and management service by telephone and the availability of in person appointments. I also discussed with the patient that there may be a patient responsible charge related to this service. The patient expressed understanding and verbally consented to this telephonic visit.    Interactive audio and video telecommunications were attempted between this provider and patient, however failed, due to patient having technical difficulties OR patient did not have access to video capability.  We continued and completed visit with audio only.    Vital signs may be patient reported or missing.   Subjective:   DOAA KENDZIERSKI is a 69 y.o. female who presents for Medicare Annual (Subsequent) preventive examination.  Review of Systems     Cardiac Risk Factors include: advanced age (>71men, >56 women);diabetes mellitus;dyslipidemia;hypertension;obesity (BMI >30kg/m2);sedentary lifestyle     Objective:    Today's Vitals   01/15/20 0811 01/15/20 0812  Weight: 286 lb (129.7 kg)   Height: 5\' 8"  (1.727 m)   PainSc:  6    Body mass index is 43.49 kg/m.  Advanced Directives 01/15/2020 12/12/2016 01/19/2016 01/10/2016  Does Patient Have a Medical Advance Directive? No No No No  Would patient like information on creating a medical advance directive? - Yes (MAU/Ambulatory/Procedural Areas - Information given) No - patient declined information No - patient declined information    Current Medications (verified) Outpatient Encounter Medications as of 01/15/2020  Medication Sig  . aspirin 325 MG tablet Take by mouth.  . diazepam (VALIUM) 5 MG tablet Take 1 tablet (5  mg total) by mouth daily as needed for anxiety.  Marland Kitchen diltiazem (CARDIZEM CD) 180 MG 24 hr capsule TAKE 1 CAPSULE BY MOUTH EVERY DAY  . fexofenadine (ALLEGRA) 180 MG tablet Take 1 tablet (180 mg total) by mouth daily. (Patient taking differently: Take 180 mg by mouth daily as needed. )  . fluticasone (FLONASE) 50 MCG/ACT nasal spray Place 2 sprays into both nostrils daily.  . furosemide (LASIX) 40 MG tablet Take 1 tablet (40 mg total) by mouth daily.  Marland Kitchen losartan (COZAAR) 25 MG tablet Take 1 tablet (25 mg total) by mouth daily.  Marland Kitchen PARoxetine (PAXIL) 40 MG tablet Take 1 tablet (40 mg total) by mouth every morning.  . pregabalin (LYRICA) 25 MG capsule Take 1 capsule (25 mg total) by mouth 2 (two) times daily.  . sitaGLIPtin (JANUVIA) 25 MG tablet Take 1 tablet (25 mg total) by mouth daily.  Marland Kitchen tiZANidine (ZANAFLEX) 4 MG tablet Take 1 tablet (4 mg total) by mouth every 6 (six) hours as needed for muscle spasms.  . traMADol (ULTRAM) 50 MG tablet Take 1 tablet (50 mg total) by mouth every 12 (twelve) hours as needed for severe pain.  Marland Kitchen albuterol (PROVENTIL HFA;VENTOLIN HFA) 108 (90 Base) MCG/ACT inhaler Inhale 2 puffs into the lungs every 6 (six) hours as needed for wheezing or shortness of breath. (Patient not taking: Reported on 12/17/2019)   No facility-administered encounter medications on file as of 01/15/2020.    Allergies (verified) Clarithromycin, Abilify [aripiprazole], Amoxicillin-pot clavulanate, Ciprofloxacin, Lisinopril, Lorcaserin, Macrodantin [nitrofurantoin macrocrystal], Olanzapine, Pantoprazole, Sulfa antibiotics, and Penicillins   History: Past Medical History:  Diagnosis Date  . Anemia    history of  .  Chronic kidney disease    stage 3-4  . Complication of anesthesia   . Diabetes mellitus without complication (HCC)    diet controlled  . GERD (gastroesophageal reflux disease)   . Hepatitis    fatty live  . History of kidney stones   . Hyperlipidemia    history of, no longer  taking meds.  . Hypertension   . Klebsiella infection   . Migraine headache with aura    history of  . MSSA (methicillin susceptible Staphylococcus aureus)   . Myelodysplasia   . OA (osteoarthritis)   . PONV (postoperative nausea and vomiting)   . 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 Rheumatic fever in pediatric patient 1961   Past Surgical History:  Procedure Laterality Date  . CATARACT EXTRACTION Left   . DISTAL INTERPHALANGEAL JOINT FUSION Right 01/19/2016   Procedure: DISTAL INTERPHALANGEAL JOINT FUSION;  Surgeon: Hessie Knows, MD;  Location: [facility] ORS;  Service: Orthopedics;  Laterality: Right;  . ENDOMETRIAL ABLATION    . TUBAL LIGATION  1988   Family History  Problem Relation Age of Onset  . Cancer Father   . Diabetes Mother   . Heart disease Mother    Social History   Socioeconomic History  . Marital status: Divorced    Spouse name: Not on file  . Number of children: Not on file  . Years of education: Not on file  . Highest education level: Not on file  Occupational History  . Occupation: retired  Tobacco Use  . Smoking status: Never Smoker  . Smokeless tobacco: Never Used  Vaping Use  . Vaping Use: Never used  Substance and Sexual Activity  . Alcohol use: No    Comment:  once every 2-3 years.  . Drug use: No  . Sexual activity: Not Currently  Other Topics Concern  . Not on file  Social History Narrative  . Not on file   Social Determinants of Health   Financial Resource Strain: Medium Risk  . Difficulty of Paying Living Expenses: Somewhat hard  Food Insecurity: Food Insecurity Present  . Worried About Charity fundraiser in the Last Year: Sometimes true  . Ran Out of Food in the Last Year: Sometimes true  Transportation Needs: No Transportation Needs  . Lack of Transportation (Medical): No  . Lack of Transportation (Non-Medical): No  Physical Activity: Inactive  . Days of Exercise per Week: 0 days  . Minutes of Exercise per  Session: 0 min  Stress: No Stress Concern Present  . Feeling of Stress : Only a little  Social Connections:   . Frequency of Communication with Friends and Family: Not on file  . Frequency of Social Gatherings with Friends and Family: Not on file  . Attends Religious Services: Not on file  . Active Member of Clubs or Organizations: Not on file  . Attends Archivist Meetings: Not on file  . Marital Status: Not on file    Tobacco Counseling Counseling given: Not Answered   Clinical Intake:  Pre-visit preparation completed: Yes  Pain : 0-10 Pain Score: 6  Pain Type: Chronic pain Pain Location: Hip Pain Orientation: Right Pain Radiating Towards: down leg Pain Descriptors / Indicators: Aching Pain Onset: More than a month ago Pain Frequency: Constant Pain Relieving Factors: prednisone and tizanadine  Pain Relieving Factors: prednisone and tizanadine  Nutritional Status: BMI > 30  Obese Nutritional Risks: Nausea/ vomitting/ diarrhea (some nausea, not sure what is causing it) Diabetes:  Yes  How often do you need to have someone help you when you read instructions, pamphlets, or other written materials from your doctor or pharmacy?: 1 - Never What is the last grade level you completed in school?: 1 yr technical school  Diabetic? Yes Nutrition Risk Assessment:  Has the patient had any N/V/D within the last 2 months?  Yes  Does the patient have any non-healing wounds?  No  Has the patient had any unintentional weight loss or weight gain?  Yes   Diabetes:  Is the patient diabetic?  Yes  If diabetic, was a CBG obtained today?  No  Did the patient bring in their glucometer from home?  No  How often do you monitor your CBG's? Doesn't check.   Financial Strains and Diabetes Management:  Are you having any financial strains with the device, your supplies or your medication? No .  Does the patient want to be seen by Chronic Care Management for management of their  diabetes?  No  Would the patient like to be referred to a Nutritionist or for Diabetic Management?  No   Diabetic Exams:  Diabetic Eye Exam: Completed 09/03/2019 Diabetic Foot Exam: Completed 07/01/2019   Interpreter Needed?: No  Information entered by :: NAllen LPN   Activities of Daily Living In your present state of health, do you have any difficulty performing the following activities: 01/15/2020 07/01/2019  Hearing? Y Y  Comment family trait -  Vision? N N  Difficulty concentrating or making decisions? N Y  Comment - remembering  Walking or climbing stairs? Y N  Comment painful -  Dressing or bathing? N N  Doing errands, shopping? N N  Preparing Food and eating ? N -  Using the Toilet? N -  In the past six months, have you accidently leaked urine? Y -  Comment all the time -  Do you have problems with loss of bowel control? Y -  Comment wears depends -  Managing your Medications? N -  Managing your Finances? N -  Housekeeping or managing your Housekeeping? N -  Some recent data might be hidden    Patient Care Team: Venita Lick, NP as PCP - General (Nurse Practitioner) Magnus Sinning, MD as Referring Physician (Nephrology) Emmaline Kluver., MD (Rheumatology)  Indicate any recent Medical Services you may have received from other than [facility] providers in the past year (date may be approximate).     Assessment:   This is a routine wellness examination for Ramonia.  Hearing/Vision screen  Hearing Screening   125Hz  250Hz  500Hz  1000Hz  2000Hz  3000Hz  4000Hz  6000Hz  8000Hz   Right ear:           Left ear:           Vision Screening Comments: Regular eye exams, Dr. Ellin Mayhew  Dietary issues and exercise activities discussed: Current Exercise Habits: The patient does not participate in regular exercise at present  Goals    . Increase water intake     Recommend drinking at least 2-3 glasses of water a day     . Patient Stated     01/15/2020, get rid of pain  in leg      Depression Screen PHQ 2/9 Scores 01/15/2020 01/07/2020 07/01/2019 03/20/2018 09/17/2017 06/10/2017 03/08/2017  PHQ - 2 Score 1 0 2 2 1 1 1   PHQ- 9 Score 5 5 12 7  - - -    Fall Risk Fall Risk  01/15/2020 01/07/2020 07/01/2019 12/12/2018 10/31/2017  Falls  in the past year? 1 1 1 1  No  Comment lost balance, fall out of bed - - Emmi Telephone Survey: data to providers prior to load -  Number falls in past yr: 1 0 1 1 -  Comment - - - Emmi Telephone Survey Actual Response = 7 -  Injury with Fall? 0 0 0 1 -  Risk for fall due to : Medication side effect;Impaired balance/gait No Fall Risks - - -  Follow up Falls evaluation completed;Education provided;Falls prevention discussed Falls evaluation completed - - -    Any stairs in or around the home? Yes  If so, are there any without handrails? No  Home free of loose throw rugs in walkways, pet beds, electrical cords, etc? Yes  Adequate lighting in your home to reduce risk of falls? Yes   ASSISTIVE DEVICES UTILIZED TO PREVENT FALLS:  Life alert? No  Use of a cane, walker or w/c? No  Grab bars in the bathroom? No  Shower chair or bench in shower? Yes  Elevated toilet seat or a handicapped toilet? No   TIMED UP AND GO:  Was the test performed? No . .    Cognitive Function:     6CIT Screen 01/15/2020 12/12/2016  What Year? 0 points 0 points  What month? 0 points 0 points  What time? 0 points 0 points  Count back from 20 0 points 0 points  Months in reverse 0 points 0 points  Repeat phrase 0 points 0 points  Total Score 0 0    Immunizations Immunization History  Administered Date(s) Administered  . Influenza, High Dose Seasonal PF 03/08/2017, 03/20/2018, 02/03/2019, 02/04/2019  . Influenza-Unspecified 02/12/2014, 03/22/2015  . PFIZER SARS-COV-2 Vaccination 08/05/2019, 08/26/2019  . Pneumococcal Conjugate-13 04/26/2015  . Pneumococcal Polysaccharide-23 08/01/2016  . Pneumococcal-Unspecified 07/19/2005, 02/19/2007  . Tdap  09/23/2013  . Zoster 08/06/2011    TDAP status: Up to date Flu Vaccine status: Up to date Pneumococcal vaccine status: Up to date Covid-19 vaccine status: Completed vaccines  Qualifies for Shingles Vaccine? Yes   Zostavax completed Yes   Shingrix Completed?: No.    Education has been provided regarding the importance of this vaccine. Patient has been advised to call insurance company to determine out of pocket expense if they have not yet received this vaccine. Advised may also receive vaccine at local pharmacy or Health Dept. Verbalized acceptance and understanding.  Screening Tests Health Maintenance  Topic Date Due  . INFLUENZA VACCINE  03/18/2020 (Originally 12/13/2019)  . COLONOSCOPY  02/21/2020  . HEMOGLOBIN A1C  06/18/2020  . FOOT EXAM  06/30/2020  . OPHTHALMOLOGY EXAM  09/02/2020  . MAMMOGRAM  02/10/2021  . TETANUS/TDAP  09/24/2023  . DEXA SCAN  Completed  . COVID-19 Vaccine  Completed  . Hepatitis C Screening  Completed  . PNA vac Low Risk Adult  Completed    Health Maintenance  There are no preventive care reminders to display for this patient.  Colorectal cancer screening: Completed 03/15/2010. Repeat every 10 years Mammogram status: Due Bone Density status: Completed 11/09/2010.   Lung Cancer Screening: (Low Dose CT Chest recommended if Age 33-80 years, 30 pack-year currently smoking OR have quit w/in 15years.) does not qualify.   Lung Cancer Screening Referral: no  Additional Screening:  Hepatitis C Screening: does qualify; Completed 11/01/2015  Vision Screening: Recommended annual ophthalmology exams for early detection of glaucoma and other disorders of the eye. Is the patient up to date with their annual eye exam?  Yes  Who is the provider or what is the name of the office in which the patient attends annual eye exams? Dr. Ellin Mayhew If pt is not established with a provider, would they like to be referred to a provider to establish care? No .   Dental  Screening: Recommended annual dental exams for proper oral hygiene  Community Resource Referral / Chronic Care Management: CRR required this visit?  Yes   CCM required this visit?  No      Plan:     I have personally reviewed and noted the following in the patient's chart:   . Medical and social history . Use of alcohol, tobacco or illicit drugs  . Current medications and supplements . Functional ability and status . Nutritional status . Physical activity . Advanced directives . List of other physicians . Hospitalizations, surgeries, and ER visits in previous 12 months . Vitals . Screenings to include cognitive, depression, and falls . Referrals and appointments  In addition, I have reviewed and discussed with patient certain preventive protocols, quality metrics, and best practice recommendations. A written personalized care plan for preventive services as well as general preventive health recommendations were provided to patient.     Kellie Simmering, LPN   09/14/6501   Nurse Notes:

## 2020-01-21 ENCOUNTER — Encounter: Payer: Self-pay | Admitting: Cardiology

## 2020-01-21 ENCOUNTER — Other Ambulatory Visit: Payer: Self-pay

## 2020-01-21 ENCOUNTER — Ambulatory Visit (INDEPENDENT_AMBULATORY_CARE_PROVIDER_SITE_OTHER): Payer: Medicare HMO

## 2020-01-21 ENCOUNTER — Ambulatory Visit: Payer: Medicare HMO | Admitting: Cardiology

## 2020-01-21 VITALS — BP 122/78 | HR 86 | Ht 68.0 in | Wt 281.0 lb

## 2020-01-21 DIAGNOSIS — R011 Cardiac murmur, unspecified: Secondary | ICD-10-CM | POA: Diagnosis not present

## 2020-01-21 DIAGNOSIS — R0602 Shortness of breath: Secondary | ICD-10-CM

## 2020-01-21 DIAGNOSIS — R0600 Dyspnea, unspecified: Secondary | ICD-10-CM | POA: Diagnosis not present

## 2020-01-21 DIAGNOSIS — I10 Essential (primary) hypertension: Secondary | ICD-10-CM

## 2020-01-21 MED ORDER — PERFLUTREN LIPID MICROSPHERE
1.0000 mL | INTRAVENOUS | Status: AC | PRN
  Administered 2020-01-21: 2 mL via INTRAVENOUS

## 2020-01-21 NOTE — Progress Notes (Signed)
Cardiology Office Note:    Date:  01/21/2020   ID:  Katie Singh, DOB 05/14/1951, MRN [id number]  PCP:  Venita Lick, NP  [facility] Cardiologist:  No primary care provider on file.  [facility] Electrophysiologist:  None   Referring MD: Venita Lick, NP   Chief Complaint  Patient presents with  . primary care referral for sob, swelling in legs    History of Present Illness:    Katie Singh is a 69 y.o. female with a hx of hypertension, diabetes, CKD, morbid obesity who presents due to shortness of breath.  Patient states feeling short of breath typically when she exerts herself for a year which is worsened over the past month.  She also notes lower extremity swelling.  Her Lasix dose was increased from 40 daily to 60 by PCP which has helped her swelling somewhat.  She sees nephrology for CKD.  She denies any history of heart disease.  Had an echocardiogram performed earlier this morning.  States having occasional nonexertional chest discomfort.  Denies dizziness, presyncope.  Patient was seen by primary care provider where lab work was obtained including BNP which was slightly abnormal at 102.  Past Medical History:  Diagnosis Date  . Anemia    history of  . Chronic kidney disease    stage 3-4  . Complication of anesthesia   . Diabetes mellitus without complication (HCC)    diet controlled  . GERD (gastroesophageal reflux disease)   . Hepatitis    fatty live  . History of kidney stones   . Hyperlipidemia    history of, no longer taking meds.  . Hypertension   . Klebsiella infection   . Migraine headache with aura    history of  . MSSA (methicillin susceptible Staphylococcus aureus)   . Myelodysplasia   . OA (osteoarthritis)   . PONV (postoperative nausea and vomiting)   . 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 Rheumatic fever in pediatric patient 1961    Past Surgical History:  Procedure Laterality Date  . CATARACT EXTRACTION  Left   . DISTAL INTERPHALANGEAL JOINT FUSION Right 01/19/2016   Procedure: DISTAL INTERPHALANGEAL JOINT FUSION;  Surgeon: Hessie Knows, MD;  Location: [facility] ORS;  Service: Orthopedics;  Laterality: Right;  . ENDOMETRIAL ABLATION    . TUBAL LIGATION  1988    Current Medications: Current Meds  Medication Sig  . albuterol (PROVENTIL HFA;VENTOLIN HFA) 108 (90 Base) MCG/ACT inhaler Inhale 2 puffs into the lungs every 6 (six) hours as needed for wheezing or shortness of breath.  . allopurinol (ZYLOPRIM) 100 MG tablet Take 100 mg by mouth daily.  Marland Kitchen aspirin 325 MG tablet Take by mouth.  . diazepam (VALIUM) 5 MG tablet Take 1 tablet (5 mg total) by mouth daily as needed for anxiety.  Marland Kitchen diltiazem (CARDIZEM CD) 180 MG 24 hr capsule TAKE 1 CAPSULE BY MOUTH EVERY DAY  . fexofenadine (ALLEGRA) 180 MG tablet Take 1 tablet (180 mg total) by mouth daily. (Patient taking differently: Take 180 mg by mouth daily as needed. )  . fluticasone (FLONASE) 50 MCG/ACT nasal spray Place 2 sprays into both nostrils daily.  . furosemide (LASIX) 40 MG tablet Take 1 tablet (40 mg total) by mouth daily.  Marland Kitchen losartan (COZAAR) 25 MG tablet Take 1 tablet (25 mg total) by mouth daily.  Marland Kitchen PARoxetine (PAXIL) 40 MG tablet Take 1 tablet (40 mg total) by mouth every morning.  . pregabalin (LYRICA)  25 MG capsule Take 1 capsule (25 mg total) by mouth 2 (two) times daily.  . sitaGLIPtin (JANUVIA) 25 MG tablet Take 1 tablet (25 mg total) by mouth daily.  Marland Kitchen tiZANidine (ZANAFLEX) 4 MG tablet Take 1 tablet (4 mg total) by mouth every 6 (six) hours as needed for muscle spasms.  . traMADol (ULTRAM) 50 MG tablet Take 1 tablet (50 mg total) by mouth every 12 (twelve) hours as needed for severe pain.     Allergies:   Clarithromycin, Abilify [aripiprazole], Amoxicillin-pot clavulanate, Ciprofloxacin, Lisinopril, Lorcaserin, Macrodantin [nitrofurantoin macrocrystal], Olanzapine, Pantoprazole, Sulfa antibiotics, and Penicillins   Social History    Socioeconomic History  . Marital status: Divorced    Spouse name: Not on file  . Number of children: Not on file  . Years of education: Not on file  . Highest education level: Not on file  Occupational History  . Occupation: retired  Tobacco Use  . Smoking status: Never Smoker  . Smokeless tobacco: Never Used  Vaping Use  . Vaping Use: Never used  Substance and Sexual Activity  . Alcohol use: No    Comment:  once every 2-3 years.  . Drug use: No  . Sexual activity: Not Currently  Other Topics Concern  . Not on file  Social History Narrative  . Not on file   Social Determinants of Health   Financial Resource Strain: Medium Risk  . Difficulty of Paying Living Expenses: Somewhat hard  Food Insecurity: Food Insecurity Present  . Worried About Charity fundraiser in the Last Year: Sometimes true  . Ran Out of Food in the Last Year: Sometimes true  Transportation Needs: No Transportation Needs  . Lack of Transportation (Medical): No  . Lack of Transportation (Non-Medical): No  Physical Activity: Inactive  . Days of Exercise per Week: 0 days  . Minutes of Exercise per Session: 0 min  Stress: No Stress Concern Present  . Feeling of Stress : Only a little  Social Connections:   . Frequency of Communication with Friends and Family: Not on file  . Frequency of Social Gatherings with Friends and Family: Not on file  . Attends Religious Services: Not on file  . Active Member of Clubs or Organizations: Not on file  . Attends Archivist Meetings: Not on file  . Marital Status: Not on file     Family History: The patient's family history includes Cancer in her father; Diabetes in her mother; Heart disease in her mother.  ROS:   Please see the history of present illness.     All other systems reviewed and are negative.  EKGs/Labs/Other Studies Reviewed:    The following studies were reviewed today:   EKG:  EKG is  ordered today.  The ekg ordered today  demonstrates sinus rhythm, occasional PACs.  Recent Labs: 12/17/2019: ALT 22; Hemoglobin 11.8; Platelets 274 01/07/2020: BNP 93.0; BUN 24; Creatinine, Ser 1.90; Potassium 4.4; Sodium 141; TSH 3.370  Recent Lipid Panel    Component Value Date/Time   CHOL 148 12/17/2019 1220   CHOL 151 09/17/2017 1039   TRIG 114 12/17/2019 1220   TRIG 96 09/17/2017 1039   HDL 33 (L) 12/17/2019 1220   CHOLHDL 4.4 10/13/2018 1405   VLDL 19 09/17/2017 1039   LDLCALC 94 12/17/2019 1220    Physical Exam:    VS:  BP 122/78   Pulse 86   Ht 5\' 8"  (1.727 m)   Wt 281 lb (127.5 kg)   SpO2 97%  BMI 42.73 kg/m     Wt Readings from Last 3 Encounters:  01/21/20 281 lb (127.5 kg)  01/15/20 286 lb (129.7 kg)  01/07/20 286 lb 9.6 oz (130 kg)     GEN:  Well nourished, well developed in no acute distress HEENT: Normal NECK: No JVD; No carotid bruits LYMPHATICS: No lymphadenopathy CARDIAC: RRR, 2/6 systolic murmur RESPIRATORY:  Clear to auscultation without rales, wheezing or rhonchi  ABDOMEN: Soft, non-tender, non-distended MUSCULOSKELETAL:  trace edema; No deformity  SKIN: Warm and dry NEUROLOGIC:  Alert and oriented x 3 PSYCHIATRIC:  Normal affect   ASSESSMENT:    1. Dyspnea, unspecified type   2. Essential hypertension   3. Morbid obesity ([place])   4. Systolic murmur    PLAN:    In order of problems listed above:  1. Patient with worsening shortness of breath especially with exertion over the past month.  Had an echocardiogram earlier today reviewed by myself showing normal systolic function, may be indeterminate to grade 1 diastolic dysfunction.  I think the patient's symptoms of dyspnea are likely secondary to deconditioning and being morbidly obese.  Weight loss, low-calorie diet, exercise as tolerated to help with conditioning encouraged.  Patient educated on anginal symptoms and if that were to happen will consider additional stress testing.  Continue Lasix 40 daily to help with edema, can take  an extra dose for volume overload keeping in mind renal dysfunction. 2. History of hypertension, BP controlled.  Continue losartan. 3. Patient is morbidly obese, low-calorie diet, graduated exercise advised. 4. Systolic murmur noted on exam, echo showed aortic valve sclerosis likely cause for murmur.  No significant stenosis noted.  Patient reassured.  Follow-up in 6 months.  Total encounter time 60 minutes  Greater than 50% was spent in counseling and coordination of care with the patient  Medication Adjustments/Labs and Tests Ordered: Current medicines are reviewed at length with the patient today.  Concerns regarding medicines are outlined above.  Orders Placed This Encounter  Procedures  . EKG 12-Lead   No orders of the defined types were placed in this encounter.   Patient Instructions  Medication Instructions:  Your physician recommends that you continue on your current medications as directed. Please refer to the Current Medication list given to you today.  *If you need a refill on your cardiac medications before your next appointment, please call your pharmacy*   Lab Work: None Ordered If you have labs (blood work) drawn today and your tests are completely normal, you will receive your results only by: Marland Kitchen MyChart Message (if you have MyChart) OR . A paper copy in the mail If you have any lab test that is abnormal or we need to change your treatment, we will call you to review the results.   Testing/Procedures: None Ordered   Follow-Up: At [facility], you and your health needs are our priority.  As part of our continuing mission to provide you with exceptional heart care, we have created designated Provider Care Teams.  These Care Teams include your primary Cardiologist (physician) and Advanced Practice Providers (APPs -  Physician Assistants and Nurse Practitioners) who all work together to provide you with the care you need, when you need it.  We recommend signing  up for the patient portal called "MyChart".  Sign up information is provided on this After Visit Summary.  MyChart is used to connect with patients for Virtual Visits (Telemedicine).  Patients are able to view lab/test results, encounter notes, upcoming appointments, etc.  Non-urgent messages can be sent to your provider as well.   To learn more about what you can do with MyChart, go to [email or web address].    Your next appointment:   6 month(s)  The format for your next appointment:   In Person  Provider:   Kate Sable, MD   Other Instructions      Signed, Kate Sable, MD  01/21/2020 1:06 PM    [place]

## 2020-01-21 NOTE — Patient Instructions (Signed)

## 2020-01-22 LAB — ECHOCARDIOGRAM COMPLETE
AR max vel: 1.58 cm2
AV Area VTI: 1.66 cm2
AV Area mean vel: 1.66 cm2
AV Mean grad: 13 mmHg
AV Peak grad: 22.7 mmHg
Ao pk vel: 2.38 m/s
Area-P 1/2: 2.46 cm2

## 2020-01-24 NOTE — Progress Notes (Signed)
Contacted via MyChart  Here are your echo results Katie Singh, it shows no heart failure at this time.  Great news.  Continue Lasix 40 MG daily and only take an extra dose 20 MG if needed for swelling.  If any questions let me know.

## 2020-01-28 ENCOUNTER — Ambulatory Visit: Payer: Medicare HMO | Admitting: Nurse Practitioner

## 2020-01-28 ENCOUNTER — Other Ambulatory Visit (HOSPITAL_COMMUNITY): Payer: Self-pay | Admitting: Physical Medicine and Rehabilitation

## 2020-01-28 ENCOUNTER — Other Ambulatory Visit: Payer: Self-pay | Admitting: Physical Medicine and Rehabilitation

## 2020-01-28 DIAGNOSIS — M5416 Radiculopathy, lumbar region: Secondary | ICD-10-CM | POA: Diagnosis not present

## 2020-01-28 DIAGNOSIS — M5441 Lumbago with sciatica, right side: Secondary | ICD-10-CM | POA: Diagnosis not present

## 2020-01-28 DIAGNOSIS — G8929 Other chronic pain: Secondary | ICD-10-CM | POA: Diagnosis not present

## 2020-01-28 DIAGNOSIS — M5136 Other intervertebral disc degeneration, lumbar region: Secondary | ICD-10-CM | POA: Diagnosis not present

## 2020-02-03 ENCOUNTER — Telehealth: Payer: Self-pay

## 2020-02-03 DIAGNOSIS — M5136 Other intervertebral disc degeneration, lumbar region: Secondary | ICD-10-CM | POA: Diagnosis not present

## 2020-02-03 DIAGNOSIS — M5416 Radiculopathy, lumbar region: Secondary | ICD-10-CM | POA: Diagnosis not present

## 2020-02-03 NOTE — Telephone Encounter (Signed)
    MA9/22/2021 1st Attempt  Name: Katie Singh   MRN: [id number]   DOB: [birth year]   AGE: 69 y.o.   GENDER: female   PCP Venita Lick, NP.   02/03/20 Spoke with the patient she stated she was ok and did not need any financial assistance for now but would call if she required assistance later.  Closing referral.    Lamyia Cdebaca, AAS Paralegal, [place] . Embedded Care Coordination [facility]  [facility] Management  [address], [place] [address] [email or web address]  [phone]   [email or web address]

## 2020-02-11 ENCOUNTER — Ambulatory Visit: Payer: Medicare HMO | Admitting: Nurse Practitioner

## 2020-02-13 ENCOUNTER — Ambulatory Visit
Admission: RE | Admit: 2020-02-13 | Discharge: 2020-02-13 | Disposition: A | Discharge status: Home / Self Care | Payer: Medicare HMO | Source: Ambulatory Visit | Attending: Physical Medicine and Rehabilitation | Admitting: Physical Medicine and Rehabilitation

## 2020-02-13 ENCOUNTER — Other Ambulatory Visit: Payer: Self-pay

## 2020-02-13 DIAGNOSIS — M545 Low back pain, unspecified: Secondary | ICD-10-CM | POA: Diagnosis not present

## 2020-02-13 DIAGNOSIS — G8929 Other chronic pain: Secondary | ICD-10-CM | POA: Diagnosis not present

## 2020-02-13 DIAGNOSIS — M5441 Lumbago with sciatica, right side: Secondary | ICD-10-CM | POA: Insufficient documentation

## 2020-02-26 ENCOUNTER — Other Ambulatory Visit: Payer: Self-pay

## 2020-02-26 ENCOUNTER — Ambulatory Visit (INDEPENDENT_AMBULATORY_CARE_PROVIDER_SITE_OTHER): Payer: Medicare HMO | Admitting: Nurse Practitioner

## 2020-02-26 ENCOUNTER — Encounter: Payer: Self-pay | Admitting: Nurse Practitioner

## 2020-02-26 DIAGNOSIS — R0602 Shortness of breath: Secondary | ICD-10-CM

## 2020-02-26 DIAGNOSIS — M1990 Unspecified osteoarthritis, unspecified site: Secondary | ICD-10-CM | POA: Diagnosis not present

## 2020-02-26 MED ORDER — TRAMADOL HCL 50 MG PO TABS
50.0000 mg | ORAL_TABLET | Freq: Two times a day (BID) | ORAL | 0 refills | Status: DC | PRN
Start: 2020-02-26 — End: 2020-04-11

## 2020-02-26 NOTE — Assessment & Plan Note (Signed)
Right hip OA and lumbar spine.  Recommend to continue collaboration with orthopedics.  Discussed at length her Gabapentin and need to further renal dose this based on recent labs vs change to Lyrica renal dosed -- she wishes to continue Lyrica and would like short fill on her Tramadol, she is aware of risks of opioid use and that office does not perform chronic pain management.  Aware to sparsely use, short fill sent.  Recommend focus on modest weight loss via regular activity and diet changes.

## 2020-02-26 NOTE — Assessment & Plan Note (Signed)
BMI 42.73 with T2DM.  Recommend continued focus on healthy diet choices and regular physical activity (30 minutes 5 days a week).

## 2020-02-26 NOTE — Assessment & Plan Note (Signed)
Ongoing.   Continue Lasix daily.  Discussed need for modest weight loss.  To wear compression hose daily, on in morning and off in afternoon.  Recommend she: - Reminded to call for an overnight weight gain of >2 pounds or a weekly weight weight of >5 pounds - not adding salt to his food and has been reading food labels. Reviewed the importance of keeping daily sodium intake to 2000mg  daily

## 2020-02-26 NOTE — Patient Instructions (Signed)
Edema  Edema is when you have too much fluid in your body or under your skin. Edema may make your legs, feet, and ankles swell up. Swelling is also common in looser tissues, like around your eyes. This is a common condition. It gets more common as you get older. There are many possible causes of edema. Eating too much salt (sodium) and being on your feet or sitting for a long time can cause edema in your legs, feet, and ankles. Hot weather may make edema worse. Edema is usually painless. Your skin may look swollen or shiny. Follow these instructions at home:  Keep the swollen body part raised (elevated) above the level of your heart when you are sitting or lying down.  Do not sit still or stand for a long time.  Do not wear tight clothes. Do not wear garters on your upper legs.  Exercise your legs. This can help the swelling go down.  Wear elastic bandages or support stockings as told by your doctor.  Eat a low-salt (low-sodium) diet to reduce fluid as told by your doctor.  Depending on the cause of your swelling, you may need to limit how much fluid you drink (fluid restriction).  Take over-the-counter and prescription medicines only as told by your doctor. Contact a doctor if:  Treatment is not working.  You have heart, liver, or kidney disease and have symptoms of edema.  You have sudden and unexplained weight gain. Get help right away if:  You have shortness of breath or chest pain.  You cannot breathe when you lie down.  You have pain, redness, or warmth in the swollen areas.  You have heart, liver, or kidney disease and get edema all of a sudden.  You have a fever and your symptoms get worse all of a sudden. Summary  Edema is when you have too much fluid in your body or under your skin.  Edema may make your legs, feet, and ankles swell up. Swelling is also common in looser tissues, like around your eyes.  Raise (elevate) the swollen body part above the level of your  heart when you are sitting or lying down.  Follow your doctor's instructions about diet and how much fluid you can drink (fluid restriction). This information is not intended to replace advice given to you by your health care provider. Make sure you discuss any questions you have with your health care provider. Document Revised: 05/03/2017 Document Reviewed: 05/18/2016 Elsevier Patient Education  2020 [employer]  

## 2020-02-26 NOTE — Progress Notes (Signed)
BP 132/80 (BP Location: Left Arm, Patient Position: Sitting, Cuff Size: Large)    Pulse 84    Temp 97.9 F (36.6 C) (Oral)    Resp 16    Wt 281 lb (127.5 kg)    SpO2 97%    BMI 42.73 kg/m    Subjective:    Patient ID: Katie Singh, female    DOB: 13-Oct-1950, 69 y.o.   MRN: [id number]  HPI: Katie Singh is a 69 y.o. female  Chief Complaint  Patient presents with   Follow-up    edema   EDEMA: Follow-up for edema, seen on 01/07/20 for this.  Labs noted a BNP of 102 and then 96 at that visit.  A1C 6.4% in August, CRT 1.90 and GFR 27 (sees nephrology).  An echo was ordered and noted on 01/21/20 EF 55-60% and aortic valve sclerosis, saw cardiology 01/21/20 believe SOB related to deconditioning and obesity.  Reports having swelling to lower extremities that started June 29th after her significant other hurt himself and she had to care for him and was in hospital watching him -- sitting up and sleeping. It does improve with elevation. Started to have SOB at the same time, prior to this had it occasionally with exertion. Notices it with walking and exertion.  Sleeps on 2 pillows at night, which is baseline for her not increased.   Needs refill on Tramadol today, last fill 01/07/20 -- seeing ortho for pain to right hip and leg pain.  Received injection recently and is going for repeat, she reports initial injection did not work.  Had recent MRI noting DJD and multiple disc bulge.   Satisfied with current treatment?yes Duration of hypertension:chronic BP monitoring frequency:not checking BP range:  BP medication side effects:no Duration of hyperlipidemia:chronic Cholesterol medication side effects:no Cholesterol supplements: none Medication compliance:good compliance Aspirin:yes Recent stressors:no Recurrent headaches:no Visual changes:no Palpitations:none Dyspnea:since June Chest pain:no Lower extremity edema:since June  Dizzy/lightheaded:no  Relevant past  medical, surgical, family and social history reviewed and updated as indicated. Interim medical history since our last visit reviewed. Allergies and medications reviewed and updated.  Review of Systems  Constitutional: Negative for activity change, appetite change, diaphoresis, fatigue and fever.  Respiratory: Positive for shortness of breath. Negative for cough, chest tightness and wheezing.   Cardiovascular: Positive for leg swelling. Negative for chest pain and palpitations.  Gastrointestinal: Negative.   Endocrine: Negative for polydipsia, polyphagia and polyuria.  Neurological: Negative.   Psychiatric/Behavioral: Negative.     Per HPI unless specifically indicated above     Objective:    BP 132/80 (BP Location: Left Arm, Patient Position: Sitting, Cuff Size: Large)    Pulse 84    Temp 97.9 F (36.6 C) (Oral)    Resp 16    Wt 281 lb (127.5 kg)    SpO2 97%    BMI 42.73 kg/m   Wt Readings from Last 3 Encounters:  02/26/20 281 lb (127.5 kg)  01/21/20 281 lb (127.5 kg)  01/15/20 286 lb (129.7 kg)    Physical Exam Vitals and nursing note reviewed.  Constitutional:      General: She is awake. She is not in acute distress.    Appearance: She is well-developed and well-groomed. She is morbidly obese. She is not ill-appearing.  HENT:     Head: Normocephalic.     Right Ear: Hearing normal.     Left Ear: Hearing normal.  Eyes:     General: Lids are normal.  Right eye: No discharge.        Left eye: No discharge.     Conjunctiva/sclera: Conjunctivae normal.     Pupils: Pupils are equal, round, and reactive to light.  Neck:     Thyroid: No thyromegaly.     Vascular: No carotid bruit.  Cardiovascular:     Rate and Rhythm: Normal rate. Rhythm irregularly irregular.     Heart sounds: Murmur heard.  Systolic murmur is present with a grade of 2/6.  No gallop.   Pulmonary:     Effort: Pulmonary effort is normal. No accessory muscle usage or respiratory distress.     Breath  sounds: Normal breath sounds.  Abdominal:     General: Bowel sounds are normal.     Palpations: Abdomen is soft.  Musculoskeletal:     Cervical back: Normal range of motion and neck supple.     Right lower leg: 2+ Pitting Edema present.     Left lower leg: 2+ Pitting Edema present.  Lymphadenopathy:     Cervical: No cervical adenopathy.  Skin:    General: Skin is warm and dry.  Neurological:     Mental Status: She is alert and oriented to person, place, and time.  Psychiatric:        Attention and Perception: Attention normal.        Mood and Affect: Mood normal.        Speech: Speech normal.        Behavior: Behavior normal. Behavior is cooperative.     Results for orders placed or performed in visit on 01/21/20  ECHOCARDIOGRAM COMPLETE  Result Value Ref Range   AR max vel 1.58 cm2   AV Peak grad 22.7 mmHg   Ao pk vel 2.38 m/s   Area-P 1/2 2.46 cm2   AV Area VTI 1.66 cm2   AV Mean grad 13.0 mmHg   AV Area mean vel 1.66 cm2      Assessment & Plan:   Problem List Items Addressed This Visit      Musculoskeletal and Integument   Osteoarthritis    Right hip OA and lumbar spine.  Recommend to continue collaboration with orthopedics.  Discussed at length her Gabapentin and need to further renal dose this based on recent labs vs change to Lyrica renal dosed -- she wishes to continue Lyrica and would like short fill on her Tramadol, she is aware of risks of opioid use and that office does not perform chronic pain management.  Aware to sparsely use, short fill sent.  Recommend focus on modest weight loss via regular activity and diet changes.        Relevant Medications   methocarbamol (ROBAXIN) 750 MG tablet   traMADol (ULTRAM) 50 MG tablet     Other   Morbid obesity (HCC) - Primary    BMI 42.73 with T2DM.  Recommend continued focus on healthy diet choices and regular physical activity (30 minutes 5 days a week).       Dyspnea    Ongoing.   Continue Lasix daily.   Discussed need for modest weight loss.  To wear compression hose daily, on in morning and off in afternoon.  Recommend she: - Reminded to call for an overnight weight gain of >2 pounds or a weekly weight weight of >5 pounds - not adding salt to his food and has been reading food labels. Reviewed the importance of keeping daily sodium intake to 2000mg  daily  Follow up plan: Return in about 4 weeks (around 03/25/2020) for T2DM, HTN/HLD, MOOD -- needs controlled subs contract.

## 2020-03-02 DIAGNOSIS — M5416 Radiculopathy, lumbar region: Secondary | ICD-10-CM | POA: Diagnosis not present

## 2020-03-02 DIAGNOSIS — M5136 Other intervertebral disc degeneration, lumbar region: Secondary | ICD-10-CM | POA: Diagnosis not present

## 2020-03-29 ENCOUNTER — Encounter: Payer: Self-pay | Admitting: Nurse Practitioner

## 2020-03-29 ENCOUNTER — Other Ambulatory Visit: Payer: Self-pay

## 2020-03-29 ENCOUNTER — Ambulatory Visit (INDEPENDENT_AMBULATORY_CARE_PROVIDER_SITE_OTHER): Payer: Medicare HMO | Admitting: Nurse Practitioner

## 2020-03-29 VITALS — BP 122/84 | HR 97 | Temp 97.7°F | Wt 281.4 lb

## 2020-03-29 DIAGNOSIS — R69 Illness, unspecified: Secondary | ICD-10-CM | POA: Diagnosis not present

## 2020-03-29 DIAGNOSIS — E1159 Type 2 diabetes mellitus with other circulatory complications: Secondary | ICD-10-CM

## 2020-03-29 DIAGNOSIS — Z23 Encounter for immunization: Secondary | ICD-10-CM

## 2020-03-29 DIAGNOSIS — E1169 Type 2 diabetes mellitus with other specified complication: Secondary | ICD-10-CM | POA: Diagnosis not present

## 2020-03-29 DIAGNOSIS — F329 Major depressive disorder, single episode, unspecified: Secondary | ICD-10-CM

## 2020-03-29 DIAGNOSIS — M1990 Unspecified osteoarthritis, unspecified site: Secondary | ICD-10-CM | POA: Diagnosis not present

## 2020-03-29 DIAGNOSIS — E785 Hyperlipidemia, unspecified: Secondary | ICD-10-CM

## 2020-03-29 DIAGNOSIS — N184 Chronic kidney disease, stage 4 (severe): Secondary | ICD-10-CM | POA: Diagnosis not present

## 2020-03-29 DIAGNOSIS — G4733 Obstructive sleep apnea (adult) (pediatric): Secondary | ICD-10-CM | POA: Diagnosis not present

## 2020-03-29 DIAGNOSIS — F419 Anxiety disorder, unspecified: Secondary | ICD-10-CM

## 2020-03-29 DIAGNOSIS — G720 Drug-induced myopathy: Secondary | ICD-10-CM | POA: Insufficient documentation

## 2020-03-29 DIAGNOSIS — Z79899 Other long term (current) drug therapy: Secondary | ICD-10-CM

## 2020-03-29 DIAGNOSIS — M791 Myalgia, unspecified site: Secondary | ICD-10-CM

## 2020-03-29 DIAGNOSIS — T466X5A Adverse effect of antihyperlipidemic and antiarteriosclerotic drugs, initial encounter: Secondary | ICD-10-CM

## 2020-03-29 DIAGNOSIS — E1122 Type 2 diabetes mellitus with diabetic chronic kidney disease: Secondary | ICD-10-CM

## 2020-03-29 DIAGNOSIS — I152 Hypertension secondary to endocrine disorders: Secondary | ICD-10-CM

## 2020-03-29 LAB — BAYER DCA HB A1C WAIVED: HB A1C (BAYER DCA - WAIVED): 6.7 % (ref <7.0)

## 2020-03-29 NOTE — Addendum Note (Signed)
Addended by: Marnee Guarneri T on: 03/29/2020 02:31 PM   Modules accepted: Orders

## 2020-03-29 NOTE — Assessment & Plan Note (Signed)
Chronic, ongoing.  BP at goal today on recheck.  Recommend she check BP at least a few mornings a week at home + document for provider.  Continue current medication regimen and adjust as needed.  Obtain BMP and TSH today, may need to further reduce or discontinue Losartan if K+ elevated on future labs.  A1C 6.7% today.  Return in 3 months to meet new PCP.

## 2020-03-29 NOTE — Progress Notes (Signed)
BP 122/84    Pulse 97    Temp 97.7 F (36.5 C)    Wt 281 lb 6.4 oz (127.6 kg)    SpO2 97%    BMI 42.79 kg/m    Subjective:    Patient ID: Katie Singh, female    DOB: 1951/03/03, 69 y.o.   MRN: [id number]  HPI: Katie Singh is a 69 y.o. female  Chief Complaint  Patient presents with   Diabetes   Mood   Back Pain    pt states she was told to go to a chiropracter for her right leg pain    DIABETES Last A1C 6.4%, on Januvia. Currently is only medication she is taking. Hypoglycemic episodes:no Polydipsia/polyuria:no Visual disturbance:no Chest pain:no Paresthesias:no Glucose Monitoring:no Accucheck frequency: Not Checking Fasting glucose: Post prandial: Evening: Before meals: Taking Insulin?:no Long acting insulin: Short acting insulin: Blood Pressure Monitoring:not checking Retinal Examination:Up To Date Foot Exam:Up to Date Pneumovax:Up to Date Influenza:Up to Date Aspirin:yes  HYPERTENSION / HYPERLIPIDEMIA Continues on Diltiazem (takes this for migraines too -- Dr. Candiss Norse told her she could stop taking, but when she tried had more headaches), Losartan 25 MG, Lasix 40 MG. No current statin, reports she took in past and gave her bad side effects (cramping). Has CPAP at home, but does not use. Has had the machine for several years.   Last had echo in 2018 -- EF 55% -- Dr. Nehemiah Massed seen at that time.  Saw cardiology last on 01/21/20. Satisfied with current treatment?yes Duration of hypertension:chronic BP monitoring frequency:not checking BP range:  BP medication side effects:no Duration of hyperlipidemia:chronic Cholesterol medication side effects:no Cholesterol supplements: none Medication compliance:good compliance Aspirin:yes Recent stressors:no Recurrent headaches:no Visual changes:no Palpitations:no Dyspnea:minimal -- baseline Chest  pain:no Lower extremity edema:since June Dizzy/lightheaded:no The 10-year ASCVD risk score Mikey Bussing DC Brooke Bonito., et al., 2013) is: 21.2%   Values used to calculate the score:     Age: 52 years     Sex: Female     Is Non-Hispanic African American: No     Diabetic: Yes     Tobacco smoker: No     Systolic Blood Pressure: 622 mmHg     Is BP treated: Yes     HDL Cholesterol: 33 mg/dL     Total Cholesterol: 148 mg/dL   CHRONIC KIDNEY DISEASE Followed by nephrology, Dr. Candiss Norse. Last visit 09/24/19. CRT 1.90 and GFR 27 with K+ 4.4, BUN 24.  CKD status:stable Medications renally dose:yes Previous renal evaluation:yes Pneumovax:Up to Date Influenza Vaccine:Up to Date  ANXIETY/STRESS Continues Paxil 40 MG, has been on for long time.  Has tried coming off and anxiety increases.  Takes Valium as needed, does not take every day.  Uses about 10 times a month at times.  Pt is aware of risks of benzo medication use to include increased sedation, respiratory suppression, falls, dependence and cardiovascular events.  Pt would like to continue treatment as benefit determined to outweigh risk.  PMP review shows last Valium fill on 01/08/20 #20 pills and last Tramadol fill on 01/07/20 for #20 pills.   Duration:stable Anxious mood: yes  Excessive worrying: no Irritability: no  Sweating: no Nausea: no Palpitations:no Hyperventilation: no Panic attacks: no Agoraphobia: no  Obscessions/compulsions: no Depressed mood: no Depression screen [facility] 2/9 03/29/2020 01/15/2020 01/07/2020 07/01/2019 03/20/2018  Decreased Interest 1 0 0 1 1  Down, Depressed, Hopeless 1 1 0 1 1  PHQ - 2 Score 2 1 0 2 2  Altered sleeping 1 1  0 3 0  Tired, decreased energy 1 3 3 3 1   Change in appetite 1 0 0 2 3  Feeling bad or failure about yourself  2 0 1 2 1   Trouble concentrating 0 0 1 0 0  Moving slowly or fidgety/restless 0 0 0 0 0  Suicidal thoughts 0 0 0 0 0  PHQ-9 Score 7 5 5 12 7   Difficult doing work/chores  Somewhat difficult Somewhat difficult Not difficult at all Not difficult at all Not difficult at all   GAD 7 : Generalized Anxiety Score 03/29/2020 01/07/2020  Nervous, Anxious, on Edge 1 1  Control/stop worrying 1 0  Worry too much - different things 0 0  Trouble relaxing 0 0  Restless 0 0  Easily annoyed or irritable 1 0  Afraid - awful might happen 0 0  Total GAD 7 Score 3 1  Anxiety Difficulty Not difficult at all Not difficult at all    BACK PAIN Had imaging in May noting joint space narrowing + lumbar spine scoliosis + significant degenerative changes L2-3.  Was last seen for injection on 03/02/20 -- continues to have pain.  Was supposed to see recently, but overslept and needs to reschedule.  She would like to see chiropractor.  Currently is taking Lyrica, last filled 03/14/20 -- previously took Gabapentin, feels this offers a little more benefit. Duration: chronic Involved hip: right  Mechanism of injury: unknown Location: lateral Onset: gradual  Severity: 5/10  Quality: dull and aching Frequency: constant Radiation: yes Aggravating factors: walking and movement   Alleviating factors: APAP, NSAIDs and rest  Status: fluctuating Treatments attempted: has taken occasional Meloxicam although endorses her kidney provider told her not to take Relief with NSAIDs?: moderate Weakness with weight bearing: no Weakness with walking: no Paresthesias / decreased sensation: no Swelling: no Redness:no Fevers: no  Relevant past medical, surgical, family and social history reviewed and updated as indicated. Interim medical history since our last visit reviewed. Allergies and medications reviewed and updated.  Review of Systems  Constitutional: Negative for activity change, appetite change, diaphoresis, fatigue and fever.  Respiratory: Negative for cough, chest tightness, shortness of breath and wheezing.   Cardiovascular: Positive for leg swelling (baseline). Negative for chest pain  and palpitations.  Gastrointestinal: Negative.   Endocrine: Negative for polydipsia, polyphagia and polyuria.  Musculoskeletal: Positive for arthralgias.  Neurological: Negative.   Psychiatric/Behavioral: Negative.     Per HPI unless specifically indicated above     Objective:    BP 122/84    Pulse 97    Temp 97.7 F (36.5 C)    Wt 281 lb 6.4 oz (127.6 kg)    SpO2 97%    BMI 42.79 kg/m   Wt Readings from Last 3 Encounters:  03/29/20 281 lb 6.4 oz (127.6 kg)  02/26/20 281 lb (127.5 kg)  01/21/20 281 lb (127.5 kg)    Physical Exam Vitals and nursing note reviewed.  Constitutional:      General: She is awake. She is not in acute distress.    Appearance: She is well-developed and well-groomed. She is morbidly obese. She is not ill-appearing.  HENT:     Head: Normocephalic.     Right Ear: Hearing normal.     Left Ear: Hearing normal.  Eyes:     General: Lids are normal.        Right eye: No discharge.        Left eye: No discharge.     Conjunctiva/sclera: Conjunctivae normal.  Pupils: Pupils are equal, round, and reactive to light.  Neck:     Thyroid: No thyromegaly.     Vascular: No carotid bruit.  Cardiovascular:     Rate and Rhythm: Normal rate and regular rhythm.     Heart sounds: Normal heart sounds. No murmur heard.  No gallop.   Pulmonary:     Effort: Pulmonary effort is normal. No accessory muscle usage or respiratory distress.     Breath sounds: Normal breath sounds.  Abdominal:     General: Bowel sounds are normal.     Palpations: Abdomen is soft.  Musculoskeletal:     Cervical back: Normal range of motion and neck supple.     Right lower leg: Edema (trace) present.     Left lower leg: Edema (trace) present.  Skin:    General: Skin is warm and dry.  Neurological:     Mental Status: She is alert and oriented to person, place, and time.  Psychiatric:        Attention and Perception: Attention normal.        Mood and Affect: Mood normal.        Speech:  Speech normal.        Behavior: Behavior normal. Behavior is cooperative.     Results for orders placed or performed in visit on 01/21/20  ECHOCARDIOGRAM COMPLETE  Result Value Ref Range   AR max vel 1.58 cm2   AV Peak grad 22.7 mmHg   Ao pk vel 2.38 m/s   Area-P 1/2 2.46 cm2   AV Area VTI 1.66 cm2   AV Mean grad 13.0 mmHg   AV Area mean vel 1.66 cm2      Assessment & Plan:   Problem List Items Addressed This Visit      Cardiovascular and Mediastinum   Hypertension associated with diabetes ([place])    Chronic, ongoing.  BP at goal today on recheck.  Recommend she check BP at least a few mornings a week at home + document for provider.  Continue current medication regimen and adjust as needed.  Obtain BMP and TSH today, may need to further reduce or discontinue Losartan if K+ elevated on future labs.  A1C 6.7% today.  Return in 3 months to meet new PCP.        Respiratory   Sleep apnea syndrome    Recommend use of CPAP and possible new sleep study as has not used in years, discussed at length benefit to overall health of CPAP.  She wishes to think about this.        Endocrine   Hyperlipidemia associated with type 2 diabetes mellitus (HCC)    Chronic, ongoing.  Poor tolerance of statin in past, no current medication, refuses statin.  Continue diet focus.  Lipid panel today.      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  A1C today 6.7%.  Continue Januvia at 25 MG, renal dosing.  Urine micro ALB 150 and A:C >300 last visit.  Continue collaboration with nephrology and review notes.  Continue to renal dose medications as needed on review.  Return in 3 months.      Relevant Orders   Bayer DCA Hb A1c Waived   Basic metabolic panel   TSH     Musculoskeletal and Integument   Inflammatory arthritis    Chronic,  ongoing.  Followed by Dr. Jefm Bryant.  Continue current collaboration  with them and pain management provider with ortho.  Referral to chiropractor per request.  Return in 3 months.        Genitourinary   CKD (chronic kidney disease) stage 4, GFR 15-29 ml/min (HCC)    Chronic, ongoing.  Continue collaboration with nephrology and review of notes.  BMP today.        Other   Major depression, chronic    Chronic, ongoing.  Denies SI/HI.  PHQ = 7 today.  Continue Paxil at this time, however discussed at length BEERS criteria and possible transition to Sertraline in upcoming months. Would benefit from Cymbalta, but due to kidney function will need to avoid this.  Refills sent.  Return in 3 months.      Anxiety    Chronic, ongoing.  Continues on PRN Valium with minimal use, last fill 01/08/20 for #20 pills.  Discussed at length BEERS criteria with her today and increased risks with age.  At this time she wishes to continue current regimen, but may consider change in future.  UDS obtained in February.  Obtain contract next visit.  Refills sent.  Return in 3 months.      Long term prescription benzodiazepine use    Refer to anxiety plan.  UDS up to date and obtain contract next visit.      Morbid obesity (HCC)    BMI 42.73 with T2DM.  Recommended eating smaller high protein, low fat meals more frequently and exercising 30 mins a day 5 times a week with a goal of 10-15lb weight loss in the next 3 months. Patient voiced their understanding and motivation to adhere to these recommendations.       Myalgia due to statin    Chronic issues with multiple trials of statins in past, on different schedules, at this time refuses statin therapy.  Discussed Repatha, may benefit from this in future.       Other Visit Diagnoses    Need for influenza vaccination       Relevant Orders   Flu Vaccine QUAD High Dose(Fluad) (Completed)       Follow up plan: Return in about 3 months (around 06/29/2020) for T2DM, HTN/HLD, MOOD.

## 2020-03-29 NOTE — Assessment & Plan Note (Addendum)
Refer to anxiety plan.  UDS up to date and obtain contract next visit.

## 2020-03-29 NOTE — Assessment & Plan Note (Signed)
Chronic, ongoing.  Continues on PRN Valium with minimal use, last fill 01/08/20 for #20 pills.  Discussed at length BEERS criteria with her today and increased risks with age.  At this time she wishes to continue current regimen, but may consider change in future.  UDS obtained in February.  Obtain contract next visit.  Refills sent.  Return in 3 months.

## 2020-03-29 NOTE — Assessment & Plan Note (Signed)
BMI 42.73 with T2DM.  Recommended eating smaller high protein, low fat meals more frequently and exercising 30 mins a day 5 times a week with a goal of 10-15lb weight loss in the next 3 months. Patient voiced their understanding and motivation to adhere to these recommendations.

## 2020-03-29 NOTE — Assessment & Plan Note (Signed)
Chronic, ongoing.  A1C today 6.7%.  Continue Januvia at 25 MG, renal dosing.  Urine micro ALB 150 and A:C >300 last visit.  Continue collaboration with nephrology and review notes.  Continue to renal dose medications as needed on review.  Return in 3 months.

## 2020-03-29 NOTE — Assessment & Plan Note (Signed)
Chronic, ongoing.  Denies SI/HI.  PHQ = 7 today.  Continue Paxil at this time, however discussed at length BEERS criteria and possible transition to Sertraline in upcoming months. Would benefit from Cymbalta, but due to kidney function will need to avoid this.  Refills sent.  Return in 3 months.

## 2020-03-29 NOTE — Assessment & Plan Note (Signed)
Chronic, ongoing.  Poor tolerance of statin in past, no current medication, refuses statin.  Continue diet focus.  Lipid panel today.

## 2020-03-29 NOTE — Assessment & Plan Note (Signed)
Chronic, ongoing.  Continue collaboration with nephrology and review of notes.  BMP today.

## 2020-03-29 NOTE — Assessment & Plan Note (Signed)
Recommend use of CPAP and possible new sleep study as has not used in years, discussed at length benefit to overall health of CPAP.  She wishes to think about this.

## 2020-03-29 NOTE — Assessment & Plan Note (Signed)
Chronic, ongoing.  Followed by Dr. Jefm Bryant.  Continue current collaboration with them and pain management provider with ortho.  Referral to chiropractor per request.  Return in 3 months.

## 2020-03-29 NOTE — Patient Instructions (Signed)

## 2020-03-29 NOTE — Assessment & Plan Note (Signed)
Chronic issues with multiple trials of statins in past, on different schedules, at this time refuses statin therapy.  Discussed Repatha, may benefit from this in future.

## 2020-03-30 LAB — LIPID PANEL W/O CHOL/HDL RATIO
Cholesterol, Total: 152 mg/dL (ref 100–199)
HDL: 34 mg/dL — ABNORMAL LOW (ref >39)
LDL Chol Calc (NIH): 96 mg/dL (ref 0–99)
Triglycerides: 118 mg/dL (ref 0–149)
VLDL Cholesterol Cal: 22 mg/dL (ref 5–40)

## 2020-03-30 LAB — BASIC METABOLIC PANEL
BUN/Creatinine Ratio: 15 (ref 12–28)
BUN: 26 mg/dL (ref 8–27)
CO2: 21 mmol/L (ref 20–29)
Calcium: 9.2 mg/dL (ref 8.7–10.3)
Chloride: 102 mmol/L (ref 96–106)
Creatinine, Ser: 1.73 mg/dL — ABNORMAL HIGH (ref 0.57–1.00)
GFR calc Af Amer: 34 mL/min/{1.73_m2} — ABNORMAL LOW (ref >59)
GFR calc non Af Amer: 29 mL/min/{1.73_m2} — ABNORMAL LOW (ref >59)
Glucose: 191 mg/dL — ABNORMAL HIGH (ref 65–99)
Potassium: 3.9 mmol/L (ref 3.5–5.2)
Sodium: 139 mmol/L (ref 134–144)

## 2020-03-30 LAB — TSH: TSH: 4.25 u[IU]/mL (ref 0.450–4.500)

## 2020-03-30 NOTE — Progress Notes (Signed)
Contacted via [place] afternoon [place], your labs have returned.  You continue to show some baseline kidney disease with no decline -- I do recommend you continue to follow-up with your kidney doctor.  Cholesterol levels remain above goal, would like to see LDL <70.  As we discussed you may benefit from injectable Repatha every 2 weeks -- definitely think about this to help lower levels and prevent stroke.  Thyroid level normal.  Any questions? Keep being awesome!!  Thank you for allowing me to participate in your care. Kindest regards, Savian Mazon

## 2020-04-08 ENCOUNTER — Other Ambulatory Visit: Payer: Self-pay | Admitting: Nurse Practitioner

## 2020-04-08 DIAGNOSIS — M1990 Unspecified osteoarthritis, unspecified site: Secondary | ICD-10-CM

## 2020-04-08 NOTE — Telephone Encounter (Signed)
Requested medication (s) are due for refill today: yes  Requested medication (s) are on the active medication list: yes  Last refill:  02/26/20 #20  Future visit scheduled: yes  Notes to clinic:  Please review for refill. Refill not delegated per protocol    Requested Prescriptions  Pending Prescriptions Disp Refills   traMADol (ULTRAM) 50 MG tablet [Pharmacy Med Name: TRAMADOL HCL 50 MG TABLET] 20 tablet 0    Sig: Take 1 tablet (50 mg total) by mouth every 12 (twelve) hours as needed for severe pain.      Not Delegated - Analgesics:  Opioid Agonists Failed - 04/08/2020  5:51 PM      Failed - This refill cannot be delegated      Failed - Urine Drug Screen completed in last 360 days      Passed - Valid encounter within last 6 months    Recent Outpatient Visits           1 week ago Type 2 diabetes mellitus with stage 4 chronic kidney disease, without long-term current use of insulin ([place])   [place], [place] T, NP   1 month ago Morbid obesity ([place])   [place], Luyando T, NP   3 months ago CKD (chronic kidney disease) stage 4, GFR 15-29 ml/min ([place])   [place] Cannady, Jolene T, NP   3 months ago Type 2 diabetes mellitus with stage 4 chronic kidney disease, without long-term current use of insulin ([place])   [place] Cannady, Jolene T, NP   9 months ago Type 2 diabetes mellitus with stage 4 chronic kidney disease, without long-term current use of insulin ([place])   [place], Barbaraann Faster, NP       Future Appointments             In 2 months Cannady, Barbaraann Faster, NP [employer], [place]   In 3 months [place], Aaron Edelman, MD [facility]   In 9 months  [employer], [facility]

## 2020-04-11 NOTE — Telephone Encounter (Signed)
Wrong office

## 2020-04-11 NOTE — Telephone Encounter (Signed)
Routing to provider  

## 2020-05-04 ENCOUNTER — Other Ambulatory Visit: Payer: Self-pay

## 2020-05-04 DIAGNOSIS — M1990 Unspecified osteoarthritis, unspecified site: Secondary | ICD-10-CM

## 2020-05-04 MED ORDER — TRAMADOL HCL 50 MG PO TABS: 50.0000 mg | ORAL_TABLET | Freq: Two times a day (BID) | ORAL | 0 refills | Status: DC | PRN

## 2020-05-04 NOTE — Telephone Encounter (Signed)
Pharmacy requesting a prescription for Tramadol, patient has switched from [place] to [place] and the fax states that they cannot transfer the last refill on the prescription

## 2020-06-10 ENCOUNTER — Other Ambulatory Visit: Payer: Self-pay | Admitting: Family Medicine

## 2020-06-10 DIAGNOSIS — I152 Hypertension secondary to endocrine disorders: Secondary | ICD-10-CM

## 2020-06-10 DIAGNOSIS — E1159 Type 2 diabetes mellitus with other circulatory complications: Secondary | ICD-10-CM

## 2020-06-15 ENCOUNTER — Telehealth: Payer: Medicare Other | Admitting: Nurse Practitioner

## 2020-06-16 ENCOUNTER — Other Ambulatory Visit: Payer: Self-pay | Admitting: Nurse Practitioner

## 2020-06-16 DIAGNOSIS — I152 Hypertension secondary to endocrine disorders: Secondary | ICD-10-CM

## 2020-06-16 DIAGNOSIS — E1159 Type 2 diabetes mellitus with other circulatory complications: Secondary | ICD-10-CM

## 2020-06-23 ENCOUNTER — Other Ambulatory Visit: Payer: Self-pay | Admitting: Nurse Practitioner

## 2020-06-23 DIAGNOSIS — I152 Hypertension secondary to endocrine disorders: Secondary | ICD-10-CM

## 2020-06-27 ENCOUNTER — Encounter: Payer: Self-pay | Admitting: Nurse Practitioner

## 2020-06-29 ENCOUNTER — Ambulatory Visit (INDEPENDENT_AMBULATORY_CARE_PROVIDER_SITE_OTHER): Payer: Medicare Other | Admitting: Nurse Practitioner

## 2020-06-29 ENCOUNTER — Ambulatory Visit
Admission: RE | Admit: 2020-06-29 | Discharge: 2020-06-29 | Disposition: A | Discharge status: Home / Self Care | Payer: Medicare Other | Attending: Nurse Practitioner | Admitting: Nurse Practitioner

## 2020-06-29 ENCOUNTER — Other Ambulatory Visit: Payer: Self-pay

## 2020-06-29 ENCOUNTER — Encounter: Payer: Self-pay | Admitting: Nurse Practitioner

## 2020-06-29 ENCOUNTER — Ambulatory Visit
Admission: RE | Admit: 2020-06-29 | Discharge: 2020-06-29 | Disposition: A | Discharge status: Home / Self Care | Payer: Medicare Other | Source: Ambulatory Visit | Attending: Nurse Practitioner | Admitting: Nurse Practitioner

## 2020-06-29 VITALS — BP 138/80 | HR 55 | Temp 98.3°F | Ht 68.0 in | Wt 272.0 lb

## 2020-06-29 DIAGNOSIS — G720 Drug-induced myopathy: Secondary | ICD-10-CM | POA: Diagnosis not present

## 2020-06-29 DIAGNOSIS — N184 Chronic kidney disease, stage 4 (severe): Secondary | ICD-10-CM | POA: Diagnosis not present

## 2020-06-29 DIAGNOSIS — E785 Hyperlipidemia, unspecified: Secondary | ICD-10-CM | POA: Diagnosis not present

## 2020-06-29 DIAGNOSIS — G4733 Obstructive sleep apnea (adult) (pediatric): Secondary | ICD-10-CM

## 2020-06-29 DIAGNOSIS — Z79899 Other long term (current) drug therapy: Secondary | ICD-10-CM | POA: Diagnosis not present

## 2020-06-29 DIAGNOSIS — E1159 Type 2 diabetes mellitus with other circulatory complications: Secondary | ICD-10-CM

## 2020-06-29 DIAGNOSIS — J3489 Other specified disorders of nose and nasal sinuses: Secondary | ICD-10-CM

## 2020-06-29 DIAGNOSIS — E1122 Type 2 diabetes mellitus with diabetic chronic kidney disease: Secondary | ICD-10-CM | POA: Diagnosis not present

## 2020-06-29 DIAGNOSIS — F329 Major depressive disorder, single episode, unspecified: Secondary | ICD-10-CM

## 2020-06-29 DIAGNOSIS — I152 Hypertension secondary to endocrine disorders: Secondary | ICD-10-CM | POA: Diagnosis not present

## 2020-06-29 DIAGNOSIS — E1169 Type 2 diabetes mellitus with other specified complication: Secondary | ICD-10-CM | POA: Diagnosis not present

## 2020-06-29 DIAGNOSIS — R059 Cough, unspecified: Secondary | ICD-10-CM | POA: Diagnosis not present

## 2020-06-29 DIAGNOSIS — F419 Anxiety disorder, unspecified: Secondary | ICD-10-CM

## 2020-06-29 LAB — BAYER DCA HB A1C WAIVED: HB A1C (BAYER DCA - WAIVED): 6.7 % (ref <7.0)

## 2020-06-29 LAB — MICROALBUMIN, URINE WAIVED
Creatinine, Urine Waived: 200 mg/dL (ref 10–300)
Microalb, Ur Waived: 30 mg/L — ABNORMAL HIGH (ref 0–19)
Microalb/Creat Ratio: <30 mg/g (ref <30)

## 2020-06-29 LAB — VERITOR FLU A/B WAIVED
Influenza A: NEGATIVE
Influenza B: NEGATIVE

## 2020-06-29 MED ORDER — DILTIAZEM HCL ER COATED BEADS 180 MG PO CP24: 180.0000 mg | ORAL_CAPSULE | Freq: Every day | ORAL | 4 refills | Status: DC

## 2020-06-29 MED ORDER — LOSARTAN POTASSIUM 25 MG PO TABS: 25.0000 mg | ORAL_TABLET | Freq: Every day | ORAL | 4 refills | Status: AC

## 2020-06-29 MED ORDER — SITAGLIPTIN PHOSPHATE 25 MG PO TABS
25.0000 mg | ORAL_TABLET | Freq: Every day | ORAL | 4 refills | Status: DC
Start: 2020-06-29 — End: 2021-05-24

## 2020-06-29 MED ORDER — DOXYCYCLINE HYCLATE 100 MG PO TABS
100.0000 mg | ORAL_TABLET | Freq: Two times a day (BID) | ORAL | 0 refills | Status: DC
Start: 2020-06-29 — End: 2020-10-18

## 2020-06-29 MED ORDER — PREGABALIN 25 MG PO CAPS: 25.0000 mg | ORAL_CAPSULE | Freq: Two times a day (BID) | ORAL | 5 refills | Status: DC

## 2020-06-29 MED ORDER — DIAZEPAM 5 MG PO TABS: 5.0000 mg | ORAL_TABLET | Freq: Every day | ORAL | 0 refills | Status: DC | PRN

## 2020-06-29 MED ORDER — ALBUTEROL SULFATE HFA 108 (90 BASE) MCG/ACT IN AERS: 2.0000 | INHALATION_SPRAY | Freq: Four times a day (QID) | RESPIRATORY_TRACT | 2 refills | Status: DC | PRN

## 2020-06-29 MED ORDER — FUROSEMIDE 40 MG PO TABS: 40.0000 mg | ORAL_TABLET | Freq: Every day | ORAL | 4 refills | Status: DC

## 2020-06-29 MED ORDER — PAROXETINE HCL 40 MG PO TABS: 40.0000 mg | ORAL_TABLET | ORAL | 4 refills | Status: DC

## 2020-06-29 NOTE — Progress Notes (Addendum)
BP 138/80   Pulse (!) 55   Temp 98.3 F (36.8 C) (Oral)   Ht 5\' 8"  (1.727 m)   Wt 272 lb (123.4 kg)   SpO2 98%   BMI 41.36 kg/m    Subjective:    Patient ID: Katie Singh, female    DOB: 06-03-1950, 70 y.o.   MRN: [id number]  HPI: ALESANDRA Singh is a 70 y.o. female  Chief Complaint  Patient presents with  . Cough    Has had for the past week, feeling nausea, trembling, shaky and does not feel good. Patient states that she has no knowledge of being exposed to covid.   . Diabetes   DIABETES Last A1C 6.7% in November, on [place]. Currently is only medication she is taking. Hypoglycemic episodes:no Polydipsia/polyuria:no Visual disturbance:no Chest pain:no Paresthesias:no Glucose Monitoring:no Accucheck frequency: Not Checking Fasting glucose: Post prandial: Evening: Before meals: Taking Insulin?:no Long acting insulin: Short acting insulin: Blood Pressure Monitoring:not checking Retinal Examination:Up To Date Foot Exam:Up to Date Pneumovax:Up to Date Influenza:Up to Date Aspirin:yes  HYPERTENSION / HYPERLIPIDEMIA Continues on Diltiazem (takes this for migraines too -- Dr. Candiss Norse told her she could stop taking, but when she tried had more headaches), Losartan 25 MG, Lasix 40 MG. No current statin, reports she took in past and gave her bad side effects (cramping) -- tried several different statins, reports muscles are sensitive to medication. Has CPAP at home, but does not use. Has had the machine for several years.   Last had echo 01/21/20 -- EF 55-60%, similar to previous in 2018 -- Dr. Nehemiah Massed seen at that time.  Saw cardiology last on 01/21/20. Satisfied with current treatment?yes Duration of hypertension:chronic BP monitoring frequency:not checking BP range:  BP medication side effects:no Duration of hyperlipidemia:chronic Cholesterol medication side  effects:no Cholesterol supplements: none Medication compliance:good compliance Aspirin:yes Recent stressors:no Recurrent headaches:no Visual changes:no Palpitations:no Dyspnea:worsening lately, some orthopnea noted -- a couple months -- sleeping on two pills, which is baseline, does improve once she sits up a little Chest pain:no Lower extremity edema:occasional, baseline Dizzy/lightheaded:no The 10-year ASCVD risk score Mikey Bussing DC Jr., et al., 2013) is: 26.4%   Values used to calculate the score:     Age: 59 years     Sex: Female     Is Non-Hispanic African American: No     Diabetic: Yes     Tobacco smoker: No     Systolic Blood Pressure: 329 mmHg     Is BP treated: Yes     HDL Cholesterol: 34 mg/dL     Total Cholesterol: 152 mg/dL   CHRONIC KIDNEY DISEASE Followed by nephrology, Dr. Candiss Norse. Last visit 09/24/19, have recommend she return, has not yet. CRT 1.73 and GFR 29 with K+ 3.9, BUN 26 recent visit. Was to return in 6 months.   CKD status:stable Medications renally dose:yes Previous renal evaluation:yes Pneumovax:Up to Date Influenza Vaccine:Up to Date  ANXIETY/STRESS Continues Paxil 40 MG, has been on for long time.  Has tried coming off and anxiety increases.  Takes Valium as needed, does not take every day.  Uses about 10 times a month at times. Pt is aware of risks of benzo medication use to include increased sedation, respiratory suppression, falls, dependence and cardiovascular events.  Pt would like to continue treatment as benefit determined to outweigh risk.  PDMP review shows last Valium fill on 01/08/20 #20 pills (does not take too often) and last Tramadol fill on 05/04/20 for #20 pills.   Duration:stable  Anxious mood: yes  Excessive worrying: no Irritability: no  Sweating: no Nausea: no Palpitations:no Hyperventilation: no Panic attacks: no Agoraphobia: no  Obscessions/compulsions: no Depressed mood: no Depression screen [facility] 2/9  06/29/2020 03/29/2020 01/15/2020 01/07/2020 07/01/2019  Decreased Interest 0 1 0 0 1  Down, Depressed, Hopeless 0 1 1 0 1  PHQ - 2 Score 0 2 1 0 2  Altered sleeping 0 1 1 0 3  Tired, decreased energy 0 1 3 3 3   Change in appetite 0 1 0 0 2  Feeling bad or failure about yourself  0 2 0 1 2  Trouble concentrating 0 0 0 1 0  Moving slowly or fidgety/restless 0 0 0 0 0  Suicidal thoughts 0 0 0 0 0  PHQ-9 Score 0 7 5 5 12   Difficult doing work/chores - Somewhat difficult Somewhat difficult Not difficult at all Not difficult at all   GAD 7 : Generalized Anxiety Score 03/29/2020 01/07/2020  Nervous, Anxious, on Edge 1 1  Control/stop worrying 1 0  Worry too much - different things 0 0  Trouble relaxing 0 0  Restless 0 0  Easily annoyed or irritable 1 0  Afraid - awful might happen 0 0  Total GAD 7 Score 3 1  Anxiety Difficulty Not difficult at all Not difficult at all    BACK PAIN Had imaging in May noting joint space narrowing + lumbar spine scoliosis + significant degenerative changes L2-3.  Was last seen for injection on 03/02/20 -- continues to have pain, but does not wish to return for injections at this time.  Currently is taking Lyrica, last filled 06/16/20 -- previously took Gabapentin, feels this offers a little more benefit. Duration: chronic Involved hip: right  Mechanism of injury: unknown Location: lateral Onset: gradual  Severity: 5/10  Quality: dull and aching Frequency: constant Radiation: yes Aggravating factors: walking and movement   Alleviating factors: APAP, NSAIDs and rest  Status: fluctuating Treatments attempted: has taken occasional Meloxicam although endorses her kidney provider told her not to take Relief with NSAIDs?: moderate Weakness with weight bearing: no Weakness with walking: no Paresthesias / decreased sensation: no Swelling: no Redness:no Fevers: no  SINUS PRESSURE Started one week ago, has been feeling nauseous this morning.  Head has been  "stopped up", had right ear hurting yesterday -- used nose spray which helped.  Has had Covid vaccine x 2.  Denies loss of taste or smell.  Does have history of pneumonia in past, years ago. Fever: no -- chills this morning Cough: yes Shortness of breath: yes Wheezing: yes Chest pain: no Chest tightness: yes Chest congestion: yes Nasal congestion: yes Runny nose: yes Post nasal drip: yes Sneezing: yes Sore throat: no Swollen glands: no Sinus pressure: yes Headache: yes Face pain: no Toothache: no Ear pain: none Ear pressure: yes bilateral Eyes red/itching:no Eye drainage/crusting: no  Vomiting: nausea with dry heave this morning Rash: no Fatigue: a little bit Sick contacts: both sides of family Strep contacts: no  Context: fluctuating Recurrent sinusitis: no Relief with OTC cold/cough medications: none taken  Treatments attempted: mucinex -- helps a little bit   Relevant past medical, surgical, family and social history reviewed and updated as indicated. Interim medical history since our last visit reviewed. Allergies and medications reviewed and updated.  Review of Systems  Constitutional: Positive for chills. Negative for activity change, appetite change, diaphoresis, fatigue and fever.  HENT: Positive for congestion, postnasal drip, rhinorrhea, sinus pressure and sneezing. Negative for sinus  pain, sore throat and voice change.   Respiratory: Positive for cough, shortness of breath (occasional) and wheezing. Negative for chest tightness.   Cardiovascular: Positive for leg swelling (baseline). Negative for chest pain and palpitations.  Gastrointestinal: Positive for nausea. Negative for abdominal distention, abdominal pain, constipation, diarrhea and vomiting.  Endocrine: Negative for polydipsia, polyphagia and polyuria.  Musculoskeletal: Positive for arthralgias.  Neurological: Positive for headaches. Negative for dizziness, tremors, seizures, speech difficulty and  weakness.  Psychiatric/Behavioral: Negative.     Per HPI unless specifically indicated above     Objective:    BP 138/80   Pulse (!) 55   Temp 98.3 F (36.8 C) (Oral)   Ht 5\' 8"  (1.727 m)   Wt 272 lb (123.4 kg)   SpO2 98%   BMI 41.36 kg/m   Wt Readings from Last 3 Encounters:  06/29/20 272 lb (123.4 kg)  03/29/20 281 lb 6.4 oz (127.6 kg)  02/26/20 281 lb (127.5 kg)    Physical Exam Vitals and nursing note reviewed.  Constitutional:      General: She is awake. She is not in acute distress.    Appearance: She is well-developed and well-groomed. She is morbidly obese. She is not ill-appearing or toxic-appearing.  HENT:     Head: Normocephalic.     Right Ear: Hearing, ear canal and external ear normal. A middle ear effusion is present.     Left Ear: Hearing, ear canal and external ear normal. A middle ear effusion is present.     Nose: Congestion and rhinorrhea present. Rhinorrhea is clear.     Right Sinus: Frontal sinus tenderness present. No maxillary sinus tenderness.     Left Sinus: Frontal sinus tenderness present. No maxillary sinus tenderness.     Mouth/Throat:     Mouth: Mucous membranes are moist.     Pharynx: Posterior oropharyngeal erythema (mild with cobblestoning) present. No pharyngeal swelling.  Eyes:     General: Lids are normal.        Right eye: No discharge.        Left eye: No discharge.     Conjunctiva/sclera: Conjunctivae normal.     Pupils: Pupils are equal, round, and reactive to light.  Neck:     Thyroid: No thyromegaly.     Vascular: No carotid bruit.  Cardiovascular:     Rate and Rhythm: Normal rate and regular rhythm.     Heart sounds: Normal heart sounds. No murmur heard. No gallop.   Pulmonary:     Effort: Pulmonary effort is normal. No accessory muscle usage or respiratory distress.     Breath sounds: Normal breath sounds.  Abdominal:     General: Bowel sounds are normal.     Palpations: Abdomen is soft.  Musculoskeletal:      Cervical back: Normal range of motion and neck supple.     Right lower leg: Edema (trace) present.     Left lower leg: Edema (trace) present.  Skin:    General: Skin is warm and dry.  Neurological:     Mental Status: She is alert and oriented to person, place, and time.  Psychiatric:        Attention and Perception: Attention normal.        Mood and Affect: Mood normal.        Speech: Speech normal.        Behavior: Behavior normal. Behavior is cooperative.    Diabetic Foot Exam - Simple   Simple Foot Form Visual Inspection  No deformities, no ulcerations, no other skin breakdown bilaterally: Yes Sensation Testing Intact to touch and monofilament testing bilaterally: Yes Pulse Check Posterior Tibialis and Dorsalis pulse intact bilaterally: Yes Comments    Results for orders placed or performed in visit on 03/29/20  Bayer DCA Hb A1c Waived  Result Value Ref Range   HB A1C (BAYER DCA - WAIVED) 6.7 <2.3 %  Basic metabolic panel  Result Value Ref Range   Glucose 191 (H) 65 - 99 mg/dL   BUN 26 8 - 27 mg/dL   Creatinine, Ser 1.73 (H) 0.57 - 1.00 mg/dL   GFR calc non Af Amer 29 (L) >59 mL/min/1.73   GFR calc Af Amer 34 (L) >59 mL/min/1.73   BUN/Creatinine Ratio 15 12 - 28   Sodium 139 134 - 144 mmol/L   Potassium 3.9 3.5 - 5.2 mmol/L   Chloride 102 96 - 106 mmol/L   CO2 21 20 - 29 mmol/L   Calcium 9.2 8.7 - 10.3 mg/dL  Lipid Panel w/o Chol/HDL Ratio  Result Value Ref Range   Cholesterol, Total 152 100 - 199 mg/dL   Triglycerides 118 0 - 149 mg/dL   HDL 34 (L) >39 mg/dL   VLDL Cholesterol Cal 22 5 - 40 mg/dL   LDL Chol Calc (NIH) 96 0 - 99 mg/dL  TSH  Result Value Ref Range   TSH 4.250 0.450 - 4.500 uIU/mL      Assessment & Plan:   Problem List Items Addressed This Visit      Cardiovascular and Mediastinum   Hypertension associated with diabetes (HCC)    Chronic, ongoing.  BP at goal today on recheck.  Recommend she check BP at least a few mornings a week at home +  document for provider.  Continue current medication regimen and adjust as needed.  Obtain BMP today, may need to further reduce or discontinue Losartan if K+ elevated on future labs.  A1C 6.7% today.  Return in 3 months to meet new PCP.      Relevant Medications   diltiazem (CARDIZEM CD) 180 MG 24 hr capsule   furosemide (LASIX) 40 MG tablet   losartan (COZAAR) 25 MG tablet   sitaGLIPtin (JANUVIA) 25 MG tablet   Other Relevant Orders   Bayer DCA Hb A1c Waived   Basic metabolic panel   Microalbumin, Urine Waived     Respiratory   Sleep apnea syndrome    Recommend use of CPAP and possible new sleep study as has not used in years, discussed at length benefit to overall health of CPAP.  She wishes to think about this.        Endocrine   Hyperlipidemia associated with type 2 diabetes mellitus (HCC)    Chronic, ongoing.  Poor tolerance of statin in past, no current medication, refuses statin.  Continue diet focus.  Lipid panel today. Could consider Repatha in future.      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  A1C today 6.7%.  Continue Januvia at 25 MG, renal dosing.  Urine micro ALB obtained today.  Continue collaboration with nephrology and review notes -- recommend she return to see them ASAP.  Continue to renal dose medications as needed on review.  Return in  3 months.      Relevant Medications   losartan (COZAAR) 25 MG tablet   sitaGLIPtin (JANUVIA) 25 MG tablet   Other Relevant Orders   Bayer DCA Hb A1c Waived   Basic metabolic panel   Microalbumin, Urine Waived     Musculoskeletal and Integument   Drug-induced myopathy    Chronic issues with multiple trials of statins in past, on different schedules, at this time refuses statin therapy.  Discussed Repatha, may benefit  from this in future.        Genitourinary   CKD (chronic kidney disease) stage 4, GFR 15-29 ml/min (HCC)    Chronic, ongoing.  Continue collaboration with nephrology and review of notes -- recommend she return for follow-up with them.  BMP today.        Other   Major depression, chronic    Chronic, ongoing.  Denies SI/HI.  PHQ = 0 today.  Continue Paxil at this time, however discussed at length BEERS criteria and possible transition to Sertraline in upcoming months. Would benefit from Cymbalta, but due to kidney function will need to avoid this.  Refills sent.  Return in 3 months.      Relevant Medications   diazepam (VALIUM) 5 MG tablet   PARoxetine (PAXIL) 40 MG tablet   Anxiety    Chronic, ongoing.  Continues on PRN Valium with minimal use, last fill 01/08/20 for #20 pills.  Discussed at length BEERS criteria with her today and increased risks with age.  At this time she wishes to continue current regimen, but may consider change in future.  UDS obtained in February 2021.  Obtain contract and new UDS next visit.  Refills sent.  Return in 3 months.      Relevant Medications   diazepam (VALIUM) 5 MG tablet   PARoxetine (PAXIL) 40 MG tablet   Long term prescription benzodiazepine use    Refer to anxiety plan.  UDS up to date and obtain contract next visit.      Morbid obesity (HCC)    BMI 41.36 with T2DM.  Recommended eating smaller high protein, low fat meals more frequently and exercising 30 mins a day 5 times a week with a goal of 10-15lb weight loss in the next 3 months. Patient voiced their understanding and motivation to adhere to these recommendations.       Relevant Medications   sitaGLIPtin (JANUVIA) 25 MG tablet   Sinus pressure    Ongoing for over a week with exposure to sick family members, concern for Covid with this patient.  Obtain testing today and flu testing.  Recommend she self quarantine until results return.  At this time will send in Doxycycline due to length  of symptoms > 1 week and concern for bacterial infection, will also obtain CXR.  Refills on Albuterol sent.  Recommend continue OTC Mucinex at home.  Ensure plenty of rest and fluids.  Return for worsening or ongoing symptoms.      Relevant Orders   Novel Coronavirus, NAA ([facility])   Veritor Flu A/B Waived   DG Chest 2 View       Follow up plan: Return in about 3 months (around 09/26/2020) for T2DM, HTN/HLD, CHRONIC PAIN.

## 2020-06-29 NOTE — Assessment & Plan Note (Signed)
Chronic, ongoing.  Denies SI/HI.  PHQ = 0 today.  Continue Paxil at this time, however discussed at length BEERS criteria and possible transition to Sertraline in upcoming months. Would benefit from Cymbalta, but due to kidney function will need to avoid this.  Refills sent.  Return in 3 months.

## 2020-06-29 NOTE — Patient Instructions (Signed)
Diabetes Mellitus and Nutrition, Adult When you have diabetes, or diabetes mellitus, it is very important to have healthy eating habits because your blood sugar (glucose) levels are greatly affected by what you eat and drink. Eating healthy foods in the right amounts, at about the same times every day, can help you:  Control your blood glucose.  Lower your risk of heart disease.  Improve your blood pressure.  Reach or maintain a healthy weight. What can affect my meal plan? Every person with diabetes is different, and each person has different needs for a meal plan. Your health care provider may recommend that you work with a dietitian to make a meal plan that is best for you. Your meal plan may vary depending on factors such as:  The calories you need.  The medicines you take.  Your weight.  Your blood glucose, blood pressure, and cholesterol levels.  Your activity level.  Other health conditions you have, such as heart or kidney disease. How do carbohydrates affect me? Carbohydrates, also called carbs, affect your blood glucose level more than any other type of food. Eating carbs naturally raises the amount of glucose in your blood. Carb counting is a method for keeping track of how many carbs you eat. Counting carbs is important to keep your blood glucose at a healthy level, especially if you use insulin or take certain oral diabetes medicines. It is important to know how many carbs you can safely have in each meal. This is different for every person. Your dietitian can help you calculate how many carbs you should have at each meal and for each snack. How does alcohol affect me? Alcohol can cause a sudden decrease in blood glucose (hypoglycemia), especially if you use insulin or take certain oral diabetes medicines. Hypoglycemia can be a life-threatening condition. Symptoms of hypoglycemia, such as sleepiness, dizziness, and confusion, are similar to symptoms of having too much  alcohol.  Do not drink alcohol if: ? Your health care provider tells you not to drink. ? You are pregnant, may be pregnant, or are planning to become pregnant.  If you drink alcohol: ? Do not drink on an empty stomach. ? Limit how much you use to:  0-1 drink a day for women.  0-2 drinks a day for men. ? Be aware of how much alcohol is in your drink. In the [place] one drink equals one 12 oz bottle of beer (355 mL), one 5 oz glass of wine (148 mL), or one 1 oz glass of hard liquor (44 mL). ? Keep yourself hydrated with water, diet soda, or unsweetened iced tea.  Keep in mind that regular soda, juice, and other mixers may contain a lot of sugar and must be counted as carbs. What are tips for following this plan? Reading food labels  Start by checking the serving size on the "Nutrition Facts" label of packaged foods and drinks. The amount of calories, carbs, fats, and other nutrients listed on the label is based on one serving of the item. Many items contain more than one serving per package.  Check the total grams (g) of carbs in one serving. You can calculate the number of servings of carbs in one serving by dividing the total carbs by 15. For example, if a food has 30 g of total carbs per serving, it would be equal to 2 servings of carbs.  Check the number of grams (g) of saturated fats and trans fats in one serving. Choose foods that have   a low amount or none of these fats.  Check the number of milligrams (mg) of salt (sodium) in one serving. Most people should limit total sodium intake to less than 2,300 mg per day.  Always check the nutrition information of foods labeled as "low-fat" or "nonfat." These foods may be higher in added sugar or refined carbs and should be avoided.  Talk to your dietitian to identify your daily goals for nutrients listed on the label. Shopping  Avoid buying canned, pre-made, or processed foods. These foods tend to be high in fat, sodium, and added  sugar.  Shop around the outside edge of the grocery store. This is where you will most often find fresh fruits and vegetables, bulk grains, fresh meats, and fresh dairy. Cooking  Use low-heat cooking methods, such as baking, instead of high-heat cooking methods like deep frying.  Cook using healthy oils, such as olive, canola, or sunflower oil.  Avoid cooking with butter, cream, or high-fat meats. Meal planning  Eat meals and snacks regularly, preferably at the same times every day. Avoid going long periods of time without eating.  Eat foods that are high in fiber, such as fresh fruits, vegetables, beans, and whole grains. Talk with your dietitian about how many servings of carbs you can eat at each meal.  Eat 4-6 oz (112-168 g) of lean protein each day, such as lean meat, chicken, fish, eggs, or tofu. One ounce (oz) of lean protein is equal to: ? 1 oz (28 g) of meat, chicken, or fish. ? 1 egg. ?  cup (62 g) of tofu.  Eat some foods each day that contain healthy fats, such as avocado, nuts, seeds, and fish.   What foods should I eat? Fruits Berries. Apples. Oranges. Peaches. Apricots. Plums. Grapes. Mango. Papaya. Pomegranate. Kiwi. Cherries. Vegetables Lettuce. Spinach. Leafy greens, including kale, chard, collard greens, and mustard greens. Beets. Cauliflower. Cabbage. Broccoli. Carrots. Green beans. Tomatoes. Peppers. Onions. Cucumbers. Brussels sprouts. Grains Whole grains, such as whole-wheat or whole-grain bread, crackers, tortillas, cereal, and pasta. Unsweetened oatmeal. Quinoa. Brown or wild rice. Meats and other proteins Seafood. Poultry without skin. Lean cuts of poultry and beef. Tofu. Nuts. Seeds. Dairy Low-fat or fat-free dairy products such as milk, yogurt, and cheese. The items listed above may not be a complete list of foods and beverages you can eat. Contact a dietitian for more information. What foods should I avoid? Fruits Fruits canned with  syrup. Vegetables Canned vegetables. Frozen vegetables with butter or cream sauce. Grains Refined white flour and flour products such as bread, pasta, snack foods, and cereals. Avoid all processed foods. Meats and other proteins Fatty cuts of meat. Poultry with skin. Breaded or fried meats. Processed meat. Avoid saturated fats. Dairy Full-fat yogurt, cheese, or milk. Beverages Sweetened drinks, such as soda or iced tea. The items listed above may not be a complete list of foods and beverages you should avoid. Contact a dietitian for more information. Questions to ask a health care provider  Do I need to meet with a diabetes educator?  Do I need to meet with a dietitian?  What number can I call if I have questions?  When are the best times to check my blood glucose? Where to find more information:  American Diabetes Association: diabetes.org  Academy of Nutrition and Dietetics: www.eatright.org  National Institute of Diabetes and Digestive and Kidney Diseases: www.niddk.nih.gov  Association of Diabetes Care and Education Specialists: www.diabeteseducator.org Summary  It is important to have healthy eating   habits because your blood sugar (glucose) levels are greatly affected by what you eat and drink.  A healthy meal plan will help you control your blood glucose and maintain a healthy lifestyle.  Your health care provider may recommend that you work with a dietitian to make a meal plan that is best for you.  Keep in mind that carbohydrates (carbs) and alcohol have immediate effects on your blood glucose levels. It is important to count carbs and to use alcohol carefully. This information is not intended to replace advice given to you by your health care provider. Make sure you discuss any questions you have with your health care provider. Document Revised: 04/07/2019 Document Reviewed: 04/07/2019 Elsevier Patient Education  2021 Elsevier Inc.  

## 2020-06-29 NOTE — Assessment & Plan Note (Signed)
Chronic, ongoing.  Continue collaboration with nephrology and review of notes -- recommend she return for follow-up with them.  BMP today.

## 2020-06-29 NOTE — Assessment & Plan Note (Signed)
Chronic issues with multiple trials of statins in past, on different schedules, at this time refuses statin therapy.  Discussed Repatha, may benefit from this in future.

## 2020-06-29 NOTE — Assessment & Plan Note (Signed)
Chronic, ongoing.  Poor tolerance of statin in past, no current medication, refuses statin.  Continue diet focus.  Lipid panel today. Could consider Repatha in future.

## 2020-06-29 NOTE — Assessment & Plan Note (Signed)
Chronic, ongoing.  Continues on PRN Valium with minimal use, last fill 01/08/20 for #20 pills.  Discussed at length BEERS criteria with her today and increased risks with age.  At this time she wishes to continue current regimen, but may consider change in future.  UDS obtained in February 2021.  Obtain contract and new UDS next visit.  Refills sent.  Return in 3 months.

## 2020-06-29 NOTE — Assessment & Plan Note (Signed)
Refer to anxiety plan.  UDS up to date and obtain contract next visit.

## 2020-06-29 NOTE — Assessment & Plan Note (Signed)
BMI 41.36 with T2DM.  Recommended eating smaller high protein, low fat meals more frequently and exercising 30 mins a day 5 times a week with a goal of 10-15lb weight loss in the next 3 months. Patient voiced their understanding and motivation to adhere to these recommendations.

## 2020-06-29 NOTE — Assessment & Plan Note (Signed)
Chronic, ongoing.  A1C today 6.7%.  Continue Januvia at 25 MG, renal dosing.  Urine micro ALB obtained today.  Continue collaboration with nephrology and review notes -- recommend she return to see them ASAP.  Continue to renal dose medications as needed on review.  Return in 3 months.

## 2020-06-29 NOTE — Assessment & Plan Note (Signed)
Chronic, ongoing.  BP at goal today on recheck.  Recommend she check BP at least a few mornings a week at home + document for provider.  Continue current medication regimen and adjust as needed.  Obtain BMP today, may need to further reduce or discontinue Losartan if K+ elevated on future labs.  A1C 6.7% today.  Return in 3 months to meet new PCP.

## 2020-06-29 NOTE — Assessment & Plan Note (Signed)
Recommend use of CPAP and possible new sleep study as has not used in years, discussed at length benefit to overall health of CPAP.  She wishes to think about this.

## 2020-06-29 NOTE — Assessment & Plan Note (Signed)
Ongoing for over a week with exposure to sick family members, concern for Covid with this patient.  Obtain testing today and flu testing.  Recommend she self quarantine until results return.  At this time will send in Doxycycline due to length of symptoms > 1 week and concern for bacterial infection, will also obtain CXR.  Refills on Albuterol sent.  Recommend continue OTC Mucinex at home.  Ensure plenty of rest and fluids.  Return for worsening or ongoing symptoms.

## 2020-06-30 ENCOUNTER — Other Ambulatory Visit: Payer: Self-pay | Admitting: Nurse Practitioner

## 2020-06-30 DIAGNOSIS — E785 Hyperlipidemia, unspecified: Secondary | ICD-10-CM

## 2020-06-30 DIAGNOSIS — E1169 Type 2 diabetes mellitus with other specified complication: Secondary | ICD-10-CM

## 2020-06-30 DIAGNOSIS — G720 Drug-induced myopathy: Secondary | ICD-10-CM

## 2020-06-30 LAB — LIPID PANEL W/O CHOL/HDL RATIO
Cholesterol, Total: 151 mg/dL (ref 100–199)
HDL: 36 mg/dL — ABNORMAL LOW (ref >39)
LDL Chol Calc (NIH): 93 mg/dL (ref 0–99)
Triglycerides: 123 mg/dL (ref 0–149)
VLDL Cholesterol Cal: 22 mg/dL (ref 5–40)

## 2020-06-30 LAB — BASIC METABOLIC PANEL
BUN/Creatinine Ratio: 11 — ABNORMAL LOW (ref 12–28)
BUN: 25 mg/dL (ref 8–27)
CO2: 18 mmol/L — ABNORMAL LOW (ref 20–29)
Calcium: 9 mg/dL (ref 8.7–10.3)
Chloride: 103 mmol/L (ref 96–106)
Creatinine, Ser: 2.28 mg/dL — ABNORMAL HIGH (ref 0.57–1.00)
GFR calc Af Amer: 24 mL/min/{1.73_m2} — ABNORMAL LOW (ref >59)
GFR calc non Af Amer: 21 mL/min/{1.73_m2} — ABNORMAL LOW (ref >59)
Glucose: 171 mg/dL — ABNORMAL HIGH (ref 65–99)
Potassium: 3.7 mmol/L (ref 3.5–5.2)
Sodium: 144 mmol/L (ref 134–144)

## 2020-06-30 LAB — NOVEL CORONAVIRUS, NAA: SARS-CoV-2, NAA: NOT DETECTED

## 2020-06-30 LAB — SARS-COV-2, NAA 2 DAY TAT

## 2020-06-30 MED ORDER — REPATHA 140 MG/ML ~~LOC~~ SOSY: 140.0000 mg | PREFILLED_SYRINGE | SUBCUTANEOUS | 5 refills | Status: DC

## 2020-06-30 NOTE — Progress Notes (Signed)
Good afternoon, do not believe patient checks MyChart, please alert her labs have returned.  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  I do continue to recommend Repatha every 2 weeks, the injectable for cholesterol treatment.  Her LDL is 93, but with her diabetes would benefit this being less then 70 for stroke prevention.  If she wishes to try this let me know.  We can try to get it covered via PA as she has tried multiple statins in past.  Any questions?   Keep being awesome!!  Thank you for allowing me to participate in your care. Kindest regards, Stellarose Cerny

## 2020-06-30 NOTE — Progress Notes (Signed)
Please let Katie Singh know Covid testing is negative.:)

## 2020-07-01 NOTE — Progress Notes (Signed)
Contacted via [employer] your chest x-ray is all normal with no acute findings.  Great news!!  Have a good weekend!!

## 2020-07-04 ENCOUNTER — Telehealth: Payer: Self-pay

## 2020-07-04 NOTE — Telephone Encounter (Signed)
PA for Repatha initiated and submitted via Cover My Meds. Key: [id number]

## 2020-07-04 NOTE — Telephone Encounter (Signed)
PA approved. Called and notified patient of approval.  

## 2020-07-25 ENCOUNTER — Ambulatory Visit: Payer: Medicare Other | Admitting: Cardiology

## 2020-07-26 ENCOUNTER — Encounter: Payer: Self-pay | Admitting: Cardiology

## 2020-07-30 ENCOUNTER — Other Ambulatory Visit: Payer: Self-pay | Admitting: Nurse Practitioner

## 2020-07-30 NOTE — Telephone Encounter (Signed)
Requested Prescriptions  Pending Prescriptions Disp Refills  . fluticasone (FLONASE) 50 MCG/ACT nasal spray [Pharmacy Med Name: FLUTICASONE PROP 50 MCG SPRAY] 16 g 4    Sig: SPRAY 2 SPRAYS INTO EACH NOSTRIL EVERY DAY     Ear, Nose, and Throat: Nasal Preparations - Corticosteroids Passed - 07/30/2020 10:14 AM      Passed - Valid encounter within last 12 months    Recent Outpatient Visits          1 month ago Type 2 diabetes mellitus with stage 4 chronic kidney disease, without long-term current use of insulin ([place])   [place], Jolene T, NP   4 months ago Type 2 diabetes mellitus with stage 4 chronic kidney disease, without long-term current use of insulin ([place])   [place], Thompsons T, NP   5 months ago Morbid obesity ([place])   [place], Brimfield T, NP   6 months ago CKD (chronic kidney disease) stage 4, GFR 15-29 ml/min ([place])   [place] Cannady, Jolene T, NP   7 months ago Type 2 diabetes mellitus with stage 4 chronic kidney disease, without long-term current use of insulin ([place])   [place], Barbaraann Faster, NP      Future Appointments            In 1 month Vigg, Avanti, MD [facility], [facility]   In 5 months  [employer], [place]

## 2020-08-10 ENCOUNTER — Other Ambulatory Visit: Payer: Self-pay | Admitting: Nurse Practitioner

## 2020-08-10 DIAGNOSIS — E1159 Type 2 diabetes mellitus with other circulatory complications: Secondary | ICD-10-CM

## 2020-08-10 DIAGNOSIS — I152 Hypertension secondary to endocrine disorders: Secondary | ICD-10-CM

## 2020-09-10 ENCOUNTER — Other Ambulatory Visit: Payer: Self-pay | Admitting: Nurse Practitioner

## 2020-09-10 DIAGNOSIS — F419 Anxiety disorder, unspecified: Secondary | ICD-10-CM

## 2020-09-10 NOTE — Telephone Encounter (Signed)
Change of pharmacy Requested Prescriptions  Pending Prescriptions Disp Refills  . PARoxetine (PAXIL) 40 MG tablet [Pharmacy Med Name: PAROXETINE HCL 40 MG TABLET] 90 tablet 3    Sig: TAKE 1 TABLET BY MOUTH EVERY DAY IN THE MORNING     Psychiatry:  Antidepressants - SSRI Passed - 09/10/2020  9:44 AM      Passed - Completed PHQ-2 or PHQ-9 in the last 360 days      Passed - Valid encounter within last 6 months    Recent Outpatient Visits          2 months ago Type 2 diabetes mellitus with stage 4 chronic kidney disease, without long-term current use of insulin ([place])   [place], Jolene T, NP   5 months ago Type 2 diabetes mellitus with stage 4 chronic kidney disease, without long-term current use of insulin ([place])   [place], [place] T, NP   6 months ago Morbid obesity ([place])   [place], Jolene T, NP   8 months ago CKD (chronic kidney disease) stage 4, GFR 15-29 ml/min ([place])   [place] Cannady, Jolene T, NP   8 months ago Type 2 diabetes mellitus with stage 4 chronic kidney disease, without long-term current use of insulin ([place])   [place], Barbaraann Faster, NP      Future Appointments            In 2 weeks Vigg, Avanti, MD [facility], [facility]   In 4 months  [facility], [facility]

## 2020-09-12 ENCOUNTER — Encounter: Payer: Self-pay | Admitting: Internal Medicine

## 2020-09-12 ENCOUNTER — Telehealth: Payer: Self-pay

## 2020-09-12 NOTE — Telephone Encounter (Signed)
Called pt to r/s 5/16 appt no answer no vm set up

## 2020-09-16 ENCOUNTER — Other Ambulatory Visit: Payer: Self-pay | Admitting: Nurse Practitioner

## 2020-09-16 DIAGNOSIS — E1159 Type 2 diabetes mellitus with other circulatory complications: Secondary | ICD-10-CM

## 2020-09-16 DIAGNOSIS — I152 Hypertension secondary to endocrine disorders: Secondary | ICD-10-CM

## 2020-09-16 NOTE — Telephone Encounter (Signed)
Requested Prescriptions  Pending Prescriptions Disp Refills  . diltiazem (CARDIZEM CD) 180 MG 24 hr capsule [Pharmacy Med Name: DILTIAZEM 24H ER(CD) 180 MG CP] 60 capsule 0    Sig: TAKE 1 CAPSULE BY MOUTH EVERY DAY     Cardiovascular:  Calcium Channel Blockers Passed - 09/16/2020  9:07 AM      Passed - Last BP in normal range    BP Readings from Last 1 Encounters:  06/29/20 138/80         Passed - Valid encounter within last 6 months    Recent Outpatient Visits          2 months ago Type 2 diabetes mellitus with stage 4 chronic kidney disease, without long-term current use of insulin ([place])   [place], Jolene T, NP   5 months ago Type 2 diabetes mellitus with stage 4 chronic kidney disease, without long-term current use of insulin ([place])   [place], [place] T, NP   6 months ago Morbid obesity ([place])   [place], Jolene T, NP   8 months ago CKD (chronic kidney disease) stage 4, GFR 15-29 ml/min ([place])   [place] Cannady, Jolene T, NP   9 months ago Type 2 diabetes mellitus with stage 4 chronic kidney disease, without long-term current use of insulin ([place])   [place], Barbaraann Faster, NP      Future Appointments            In 1 week Vigg, Avanti, MD [facility], [facility]   In 4 months  [facility], [facility]           . albuterol (VENTOLIN HFA) 108 (90 Base) MCG/ACT inhaler [Pharmacy Med Name: ALBUTEROL HFA (PROVENTIL) INH] 6.7 each 2    Sig: TAKE 2 PUFFS BY MOUTH EVERY 6 HOURS AS NEEDED FOR WHEEZE OR SHORTNESS OF BREATH     Pulmonology:  Beta Agonists Failed - 09/16/2020  9:07 AM      Failed - One inhaler should last at least one month. If the patient is requesting refills earlier, contact the patient to check for uncontrolled symptoms.      Passed - Valid encounter within last 12 months    Recent Outpatient Visits          2 months ago Type 2 diabetes mellitus  with stage 4 chronic kidney disease, without long-term current use of insulin ([place])   [place], Jolene T, NP   5 months ago Type 2 diabetes mellitus with stage 4 chronic kidney disease, without long-term current use of insulin ([place])   [place], Garner T, NP   6 months ago Morbid obesity ([place])   [place], Jolene T, NP   8 months ago CKD (chronic kidney disease) stage 4, GFR 15-29 ml/min ([place])   [place] Cannady, Jolene T, NP   9 months ago Type 2 diabetes mellitus with stage 4 chronic kidney disease, without long-term current use of insulin ([place])   [place], Barbaraann Faster, NP      Future Appointments            In 1 week Vigg, Avanti, MD [facility], [facility]   In 4 months  [facility], [facility]

## 2020-09-26 ENCOUNTER — Ambulatory Visit: Payer: Medicare Other | Admitting: Internal Medicine

## 2020-09-27 ENCOUNTER — Ambulatory Visit (INDEPENDENT_AMBULATORY_CARE_PROVIDER_SITE_OTHER): Payer: Medicare Other | Admitting: Internal Medicine

## 2020-09-27 ENCOUNTER — Other Ambulatory Visit: Payer: Self-pay

## 2020-09-27 ENCOUNTER — Encounter: Payer: Self-pay | Admitting: Internal Medicine

## 2020-09-27 VITALS — BP 112/78 | HR 153 | Temp 98.0°F | Ht 67.56 in | Wt 275.4 lb

## 2020-09-27 DIAGNOSIS — E1169 Type 2 diabetes mellitus with other specified complication: Secondary | ICD-10-CM

## 2020-09-27 DIAGNOSIS — E785 Hyperlipidemia, unspecified: Secondary | ICD-10-CM

## 2020-09-27 DIAGNOSIS — E1122 Type 2 diabetes mellitus with diabetic chronic kidney disease: Secondary | ICD-10-CM

## 2020-09-27 DIAGNOSIS — R000 Tachycardia, unspecified: Secondary | ICD-10-CM | POA: Diagnosis not present

## 2020-09-27 DIAGNOSIS — R7989 Other specified abnormal findings of blood chemistry: Secondary | ICD-10-CM

## 2020-09-27 DIAGNOSIS — N184 Chronic kidney disease, stage 4 (severe): Secondary | ICD-10-CM

## 2020-09-27 DIAGNOSIS — M1990 Unspecified osteoarthritis, unspecified site: Secondary | ICD-10-CM

## 2020-09-27 DIAGNOSIS — I152 Hypertension secondary to endocrine disorders: Secondary | ICD-10-CM | POA: Diagnosis not present

## 2020-09-27 DIAGNOSIS — E1159 Type 2 diabetes mellitus with other circulatory complications: Secondary | ICD-10-CM

## 2020-09-27 MED ORDER — TIZANIDINE HCL 4 MG PO TABS: 4.0000 mg | ORAL_TABLET | Freq: Four times a day (QID) | ORAL | 0 refills | Status: DC | PRN

## 2020-09-27 MED ORDER — TRAMADOL HCL 50 MG PO TABS: 50.0000 mg | ORAL_TABLET | Freq: Two times a day (BID) | ORAL | 0 refills | Status: DC | PRN

## 2020-09-27 NOTE — Progress Notes (Signed)
BP 112/78   Pulse (!) 153   Temp 98 F (36.7 C) (Oral)   Ht 5' 7.56" (1.716 m)   Wt 275 lb 6.4 oz (124.9 kg)   SpO2 97%   BMI 42.42 kg/m    Subjective:    Patient ID: Katie Singh, female    DOB: Oct 24, 1950, 70 y.o.   MRN: [id number]  HPI: Katie Singh is a 70 y.o. female  Diabetes She presents for her follow-up diabetic visit. She has type 2 diabetes mellitus. Her disease course has been improving. Pertinent negatives for diabetes include no blurred vision, no chest pain, no fatigue, no foot ulcerations, no polydipsia, no polyphagia, no polyuria, no visual change and no weakness.  Hypertension This is a chronic problem. The problem is controlled. Associated symptoms include anxiety and shortness of breath. Pertinent negatives include no blurred vision or chest pain.  Hyperlipidemia This is a chronic problem. The problem is controlled. Associated symptoms include shortness of breath. Pertinent negatives include no chest pain.  Anxiety Presents for follow-up (takes this for when gets worse specially with her husbands health) visit. Symptoms include irritability and shortness of breath. Patient reports no chest pain.    Depression        This is a chronic problem.  Associated symptoms include no fatigue and no appetite change.  Past medical history includes anxiety.   Shortness of Breath Pertinent negatives include no chest pain.    Chief Complaint  Patient presents with  . Diabetes  . Hypertension  . Hyperlipidemia  . Anxiety  . Depression  . Chronic Kidney Disease  . Shortness of Breath    Patient states that she finds it hard to breath when she walks far or lays down she feels like she is suffocating.   . Fatigue    Relevant past medical, surgical, family and social history reviewed and updated as indicated. Interim medical history since our last visit reviewed. Allergies and medications reviewed and updated.  Review of Systems  Constitutional: Positive for  irritability. Negative for appetite change and fatigue.  Eyes: Negative for blurred vision.  Respiratory: Positive for shortness of breath.   Cardiovascular: Negative for chest pain.  Endocrine: Negative for polydipsia, polyphagia and polyuria.  Neurological: Negative for weakness.  Psychiatric/Behavioral: Positive for depression.    Per HPI unless specifically indicated above     Objective:    BP 112/78   Pulse (!) 153   Temp 98 F (36.7 C) (Oral)   Ht 5' 7.56" (1.716 m)   Wt 275 lb 6.4 oz (124.9 kg)   SpO2 97%   BMI 42.42 kg/m   Wt Readings from Last 3 Encounters:  09/27/20 275 lb 6.4 oz (124.9 kg)  06/29/20 272 lb (123.4 kg)  03/29/20 281 lb 6.4 oz (127.6 kg)    Physical Exam Vitals and nursing note reviewed.  Constitutional:      General: She is not in acute distress.    Appearance: Normal appearance. She is not ill-appearing or diaphoretic.  HENT:     Head: Normocephalic and atraumatic.     Right Ear: Tympanic membrane and external ear normal. There is no impacted cerumen.     Left Ear: External ear normal.     Nose: No congestion or rhinorrhea.     Mouth/Throat:     Pharynx: No oropharyngeal exudate or posterior oropharyngeal erythema.  Eyes:     Conjunctiva/sclera: Conjunctivae normal.     Pupils: Pupils are equal, round, and reactive to  light.  Cardiovascular:     Rate and Rhythm: Normal rate and regular rhythm.     Heart sounds: No murmur heard. No friction rub. No gallop.   Pulmonary:     Effort: No respiratory distress.     Breath sounds: No stridor. No wheezing or rhonchi.  Chest:     Chest wall: No tenderness.  Abdominal:     General: Abdomen is flat. Bowel sounds are normal. There is no distension.     Palpations: Abdomen is soft. There is no mass.     Tenderness: There is no abdominal tenderness. There is no guarding.  Musculoskeletal:        General: No swelling or deformity.     Cervical back: Normal range of motion and neck supple. No  rigidity or tenderness.     Right lower leg: No edema.     Left lower leg: No edema.  Skin:    General: Skin is warm and dry.     Coloration: Skin is not jaundiced.     Findings: No erythema.  Neurological:     Mental Status: She is alert and oriented to person, place, and time. Mental status is at baseline.  Psychiatric:        Mood and Affect: Mood normal.        Behavior: Behavior normal.        Thought Content: Thought content normal.        Judgment: Judgment normal.     Results for orders placed or performed in visit on 06/29/20  Novel Coronavirus, NAA ([facility])   Specimen: Nasopharyngeal(NP) swabs in vial transport medium  Result Value Ref Range   SARS-CoV-2, NAA Not Detected Not Detected  SARS-COV-2, NAA 2 DAY TAT  Result Value Ref Range   SARS-CoV-2, NAA 2 DAY TAT Performed   Bayer DCA Hb A1c Waived  Result Value Ref Range   HB A1C (BAYER DCA - WAIVED) 6.7 <8.6 %  Basic metabolic panel  Result Value Ref Range   Glucose 171 (H) 65 - 99 mg/dL   BUN 25 8 - 27 mg/dL   Creatinine, Ser 2.28 (H) 0.57 - 1.00 mg/dL   GFR calc non Af Amer 21 (L) >59 mL/min/1.73   GFR calc Af Amer 24 (L) >59 mL/min/1.73   BUN/Creatinine Ratio 11 (L) 12 - 28   Sodium 144 134 - 144 mmol/L   Potassium 3.7 3.5 - 5.2 mmol/L   Chloride 103 96 - 106 mmol/L   CO2 18 (L) 20 - 29 mmol/L   Calcium 9.0 8.7 - 10.3 mg/dL  Microalbumin, Urine Waived  Result Value Ref Range   Microalb, Ur Waived 30 (H) 0 - 19 mg/L   Creatinine, Urine Waived 200 10 - 300 mg/dL   Microalb/Creat Ratio <30 <30 mg/g  Lipid Panel w/o Chol/HDL Ratio  Result Value Ref Range   Cholesterol, Total 151 100 - 199 mg/dL   Triglycerides 123 0 - 149 mg/dL   HDL 36 (L) >39 mg/dL   VLDL Cholesterol Cal 22 5 - 40 mg/dL   LDL Chol Calc (NIH) 93 0 - 99 mg/dL  Veritor Flu A/B Waived  Result Value Ref Range   Influenza A Negative Negative   Influenza B Negative Negative        Current Outpatient Medications:  .  albuterol  (VENTOLIN HFA) 108 (90 Base) MCG/ACT inhaler, Inhale 2 puffs into the lungs every 6 (six) hours as needed for wheezing or shortness of breath., Disp: 1 each,  Rfl: 2 .  allopurinol (ZYLOPRIM) 100 MG tablet, Take 100 mg by mouth daily., Disp: , Rfl:  .  aspirin 325 MG tablet, Take by mouth., Disp: , Rfl:  .  diazepam (VALIUM) 5 MG tablet, Take 1 tablet (5 mg total) by mouth daily as needed for anxiety., Disp: 20 tablet, Rfl: 0 .  diltiazem (CARDIZEM CD) 180 MG 24 hr capsule, TAKE 1 CAPSULE BY MOUTH EVERY DAY, Disp: 30 capsule, Rfl: 0 .  Evolocumab (REPATHA) 140 MG/ML SOSY, Inject 140 mg into the skin every 14 (fourteen) days., Disp: 2.1 mL, Rfl: 5 .  fexofenadine (ALLEGRA) 180 MG tablet, Take 1 tablet (180 mg total) by mouth daily. (Patient taking differently: Take 180 mg by mouth daily as needed.), Disp: 100 tablet, Rfl: 1 .  fluticasone (FLONASE) 50 MCG/ACT nasal spray, SPRAY 2 SPRAYS INTO EACH NOSTRIL EVERY DAY, Disp: 16 g, Rfl: 4 .  furosemide (LASIX) 40 MG tablet, Take 1 tablet (40 mg total) by mouth daily., Disp: 90 tablet, Rfl: 4 .  losartan (COZAAR) 25 MG tablet, Take 1 tablet (25 mg total) by mouth daily., Disp: 90 tablet, Rfl: 4 .  methocarbamol (ROBAXIN) 750 MG tablet, TAKE 1/2 TO 1 TABLET BY MOUTH TWICE A DAY AS NEEDED, Disp: , Rfl:  .  PARoxetine (PAXIL) 40 MG tablet, TAKE 1 TABLET BY MOUTH EVERY DAY IN THE MORNING, Disp: 90 tablet, Rfl: 3 .  pregabalin (LYRICA) 25 MG capsule, Take 1 capsule (25 mg total) by mouth 2 (two) times daily., Disp: 60 capsule, Rfl: 5 .  sitaGLIPtin (JANUVIA) 25 MG tablet, Take 1 tablet (25 mg total) by mouth daily., Disp: 90 tablet, Rfl: 4 .  TIADYLT ER 180 MG 24 hr capsule, Take 180 mg by mouth daily., Disp: , Rfl:  .  tiZANidine (ZANAFLEX) 4 MG tablet, Take 1 tablet (4 mg total) by mouth every 6 (six) hours as needed for muscle spasms., Disp: 30 tablet, Rfl: 0 .  traMADol (ULTRAM) 50 MG tablet, Take 1 tablet (50 mg total) by mouth every 12 (twelve) hours as  needed for severe pain., Disp: 20 tablet, Rfl: 0 .  doxycycline (VIBRA-TABS) 100 MG tablet, Take 1 tablet (100 mg total) by mouth 2 (two) times daily. (Patient not taking: Reported on 09/27/2020), Disp: 20 tablet, Rfl: 0    Assessment & Plan:  Incidental elevated HR ? Faulty machine  With HR at 153 on entry to office.  On checking pulse / exam / EKG pts HR is normal at 70 - 89   CKD  Results for Katie, Singh (MRN [id number]) as of 09/27/2020 14:17  Ref. Range 03/29/2020 11:54 06/29/2020 11:15 06/29/2020 11:17  BUN Latest Ref Range: 8 - 27 mg/dL 26  25  Creatinine Latest Ref Range: 0.57 - 1.00 mg/dL 1.73 (H)  2.28 (H)  Calcium Latest Ref Range: 8.7 - 10.3 mg/dL 9.2  9.0  BUN/Creatinine Ratio Latest Ref Range: 12 - 28  15  11  (L)  GFR, Est Non African American Latest Ref Range: >59 mL/min/1.73 29 (L)  21 (L)  GFR, Est African American Latest Ref Range: >59 mL/min/1.73 34 (L)  24 (L)     DM on [place] for such . Consider farxiga a1c at 6.7  check HbA1c,  urine  microalbumin  diabetic diet plan given to pt  adviced regarding hypoglycemia and instructions given to pt today on how to prevent and treat the same if it were to occur. pt acknowledges the plan and voices understanding of the  same.  exercise plan given and encouraged.   advice diabetic yearly podiatry, ophthalmology , nutritionist , dental check q 6 months,  SOB ? Sec to deconditioning per cards last notes in sept ECHO done same visit shows diastolic dysfunction on chart review  echo done per cards last year. 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  non smoker   Sleep apnea not using cpap   Migraines - is on cardizem for such   HTn cozaar,  Continue current meds.  Medication compliance emphasised. pt advised to keep Bp logs. Pt verbalised understanding of the same. Pt to have a low salt diet . Exercise to reach a goal of at least 150 mins a week.  lifestyle modifications explained and pt understands importance of the  above.  Depression on paxil for such  Valium as needed.     Problem List Items Addressed This Visit      Musculoskeletal and Integument   Osteoarthritis   Relevant Medications   methocarbamol (ROBAXIN) 750 MG tablet    Other Visit Diagnoses    Heart rate fast    -  Primary   Relevant Orders   EKG 12-Lead (Completed)        Follow up plan: Return in about 3 weeks (around 10/18/2020).   CBC, CMP, FLP, HBA1C, TSH, urine microalbumin Labs 1 week prior to next visit.

## 2020-10-05 DIAGNOSIS — R002 Palpitations: Secondary | ICD-10-CM | POA: Diagnosis not present

## 2020-10-05 DIAGNOSIS — N1832 Chronic kidney disease, stage 3b: Secondary | ICD-10-CM | POA: Diagnosis not present

## 2020-10-05 DIAGNOSIS — Z7689 Persons encountering health services in other specified circumstances: Secondary | ICD-10-CM | POA: Diagnosis not present

## 2020-10-05 DIAGNOSIS — R0602 Shortness of breath: Secondary | ICD-10-CM | POA: Diagnosis not present

## 2020-10-05 DIAGNOSIS — E782 Mixed hyperlipidemia: Secondary | ICD-10-CM | POA: Diagnosis not present

## 2020-10-05 DIAGNOSIS — I10 Essential (primary) hypertension: Secondary | ICD-10-CM | POA: Diagnosis not present

## 2020-10-12 ENCOUNTER — Other Ambulatory Visit: Payer: Medicare Other

## 2020-10-12 ENCOUNTER — Other Ambulatory Visit: Payer: Self-pay

## 2020-10-12 DIAGNOSIS — E1159 Type 2 diabetes mellitus with other circulatory complications: Secondary | ICD-10-CM | POA: Diagnosis not present

## 2020-10-12 DIAGNOSIS — R7989 Other specified abnormal findings of blood chemistry: Secondary | ICD-10-CM

## 2020-10-12 DIAGNOSIS — R8271 Bacteriuria: Secondary | ICD-10-CM

## 2020-10-12 DIAGNOSIS — R000 Tachycardia, unspecified: Secondary | ICD-10-CM

## 2020-10-12 DIAGNOSIS — M1990 Unspecified osteoarthritis, unspecified site: Secondary | ICD-10-CM

## 2020-10-12 DIAGNOSIS — E785 Hyperlipidemia, unspecified: Secondary | ICD-10-CM | POA: Diagnosis not present

## 2020-10-12 DIAGNOSIS — E1169 Type 2 diabetes mellitus with other specified complication: Secondary | ICD-10-CM | POA: Diagnosis not present

## 2020-10-12 DIAGNOSIS — I152 Hypertension secondary to endocrine disorders: Secondary | ICD-10-CM

## 2020-10-12 DIAGNOSIS — R300 Dysuria: Secondary | ICD-10-CM

## 2020-10-12 LAB — MICROALBUMIN, URINE WAIVED
Creatinine, Urine Waived: 300 mg/dL (ref 10–300)
Microalb, Ur Waived: 30 mg/L — ABNORMAL HIGH (ref 0–19)
Microalb/Creat Ratio: <30 mg/g (ref <30)

## 2020-10-12 LAB — URINALYSIS, ROUTINE W REFLEX MICROSCOPIC
Bilirubin, UA: NEGATIVE
Glucose, UA: NEGATIVE
Ketones, UA: NEGATIVE
Nitrite, UA: NEGATIVE
Protein,UA: NEGATIVE
RBC, UA: NEGATIVE
Specific Gravity, UA: 1.02 (ref 1.005–1.030)
Urobilinogen, Ur: 0.2 mg/dL (ref 0.2–1.0)
pH, UA: 5 (ref 5.0–7.5)

## 2020-10-12 LAB — MICROSCOPIC EXAMINATION

## 2020-10-12 LAB — BAYER DCA HB A1C WAIVED: HB A1C (BAYER DCA - WAIVED): 6.9 % (ref <7.0)

## 2020-10-13 LAB — CBC WITH DIFFERENTIAL/PLATELET
Basophils Absolute: 0.1 10*3/uL (ref 0.0–0.2)
Basos: 1 %
EOS (ABSOLUTE): 0.2 10*3/uL (ref 0.0–0.4)
Eos: 4 %
Hematocrit: 39.2 % (ref 34.0–46.6)
Hemoglobin: 12.8 g/dL (ref 11.1–15.9)
Immature Grans (Abs): 0 10*3/uL (ref 0.0–0.1)
Immature Granulocytes: 0 %
Lymphocytes Absolute: 1.6 10*3/uL (ref 0.7–3.1)
Lymphs: 29 %
MCH: 27.6 pg (ref 26.6–33.0)
MCHC: 32.7 g/dL (ref 31.5–35.7)
MCV: 85 fL (ref 79–97)
Monocytes Absolute: 0.5 10*3/uL (ref 0.1–0.9)
Monocytes: 8 %
Neutrophils Absolute: 3.3 10*3/uL (ref 1.4–7.0)
Neutrophils: 58 %
Platelets: 285 10*3/uL (ref 150–450)
RBC: 4.64 x10E6/uL (ref 3.77–5.28)
RDW: 14.1 % (ref 11.7–15.4)
WBC: 5.6 10*3/uL (ref 3.4–10.8)

## 2020-10-13 LAB — COMPREHENSIVE METABOLIC PANEL
ALT: 14 IU/L (ref 0–32)
AST: 14 IU/L (ref 0–40)
Albumin/Globulin Ratio: 1.4 (ref 1.2–2.2)
Albumin: 4.2 g/dL (ref 3.8–4.8)
Alkaline Phosphatase: 170 IU/L — ABNORMAL HIGH (ref 44–121)
BUN/Creatinine Ratio: 13 (ref 12–28)
BUN: 26 mg/dL (ref 8–27)
Bilirubin Total: 0.5 mg/dL (ref 0.0–1.2)
CO2: 22 mmol/L (ref 20–29)
Calcium: 9.1 mg/dL (ref 8.7–10.3)
Chloride: 101 mmol/L (ref 96–106)
Creatinine, Ser: 2.01 mg/dL — ABNORMAL HIGH (ref 0.57–1.00)
Globulin, Total: 2.9 g/dL (ref 1.5–4.5)
Glucose: 148 mg/dL — ABNORMAL HIGH (ref 65–99)
Potassium: 4 mmol/L (ref 3.5–5.2)
Sodium: 140 mmol/L (ref 134–144)
Total Protein: 7.1 g/dL (ref 6.0–8.5)
eGFR: 26 mL/min/{1.73_m2} — ABNORMAL LOW (ref >59)

## 2020-10-13 LAB — LIPID PANEL
Chol/HDL Ratio: 4.1 ratio (ref 0.0–4.4)
Cholesterol, Total: 145 mg/dL (ref 100–199)
HDL: 35 mg/dL — ABNORMAL LOW (ref >39)
LDL Chol Calc (NIH): 87 mg/dL (ref 0–99)
Triglycerides: 129 mg/dL (ref 0–149)
VLDL Cholesterol Cal: 23 mg/dL (ref 5–40)

## 2020-10-13 LAB — THYROID PANEL WITH TSH
Free Thyroxine Index: 1.8 (ref 1.2–4.9)
T3 Uptake Ratio: 24 % (ref 24–39)
T4, Total: 7.5 ug/dL (ref 4.5–12.0)
TSH: 10.2 u[IU]/mL — ABNORMAL HIGH (ref 0.450–4.500)

## 2020-10-15 LAB — URINE CULTURE

## 2020-10-18 ENCOUNTER — Other Ambulatory Visit: Payer: Self-pay

## 2020-10-18 ENCOUNTER — Encounter: Payer: Self-pay | Admitting: Internal Medicine

## 2020-10-18 ENCOUNTER — Telehealth (INDEPENDENT_AMBULATORY_CARE_PROVIDER_SITE_OTHER): Payer: Medicare Other | Admitting: Internal Medicine

## 2020-10-18 ENCOUNTER — Other Ambulatory Visit: Payer: Self-pay | Admitting: Nurse Practitioner

## 2020-10-18 DIAGNOSIS — E785 Hyperlipidemia, unspecified: Secondary | ICD-10-CM

## 2020-10-18 DIAGNOSIS — G720 Drug-induced myopathy: Secondary | ICD-10-CM

## 2020-10-18 DIAGNOSIS — E1169 Type 2 diabetes mellitus with other specified complication: Secondary | ICD-10-CM

## 2020-10-18 DIAGNOSIS — E039 Hypothyroidism, unspecified: Secondary | ICD-10-CM

## 2020-10-18 DIAGNOSIS — E1159 Type 2 diabetes mellitus with other circulatory complications: Secondary | ICD-10-CM

## 2020-10-18 DIAGNOSIS — I152 Hypertension secondary to endocrine disorders: Secondary | ICD-10-CM

## 2020-10-18 DIAGNOSIS — E1122 Type 2 diabetes mellitus with diabetic chronic kidney disease: Secondary | ICD-10-CM

## 2020-10-18 DIAGNOSIS — N184 Chronic kidney disease, stage 4 (severe): Secondary | ICD-10-CM

## 2020-10-18 MED ORDER — ALLOPURINOL 100 MG PO TABS: 100.0000 mg | ORAL_TABLET | Freq: Every day | ORAL | 1 refills | Status: DC

## 2020-10-18 MED ORDER — PREGABALIN 25 MG PO CAPS: 25.0000 mg | ORAL_CAPSULE | Freq: Two times a day (BID) | ORAL | 3 refills | Status: DC

## 2020-10-18 MED ORDER — LEVOTHYROXINE SODIUM 25 MCG PO TABS: 25.0000 ug | ORAL_TABLET | Freq: Every day | ORAL | 3 refills | Status: DC

## 2020-10-18 NOTE — Progress Notes (Signed)
There were no vitals taken for this visit.   Subjective:    Patient ID: Katie Singh, female    DOB: June 06, 1950, 70 y.o.   MRN: [id number]  Chief Complaint  Patient presents with  . Hyperlipidemia  . Diabetes  . Hypertension  . Chronic Kidney Disease  . Anxiety  . Urinary Frequency    With some urinary burning and pressure     HPI: Katie Singh is a 70 y.o. female  I connected with [name] on 10/24/20 by a video enabled telemedicine application and verified that I am speaking with the correct person using two identifiers.  I discussed the limitations of evaluation and management by telemedicine. The patient expressed understanding and agreed to proceed. "I discussed the limitations of evaluation and management by telemedicine and the availability of in person appointments. The patient expressed understanding and agreed to proceed"     This visit was completed via MyChart due to the restrictions of the COVID-19 pandemic. All issues as above were discussed and addressed. Physical exam was done as above through visual confirmation on MyChart. If it was felt that the patient should be evaluated in the office, they were directed there. The patient verbally consented to this visit. Location of the patient: home Location of the provider: work Those involved with this call:  Provider: Charlynne Cousins, MD CMA: Frazier Butt, [employer] Desk/Registration: Jill Side  Time spent on call: 10 minutes with patient face to face via video conference. More than 50% of this time was spent in counseling and coordination of care. 10 minutes total spent in review of patient's record and preparation of their chart.   Hyperlipidemia This is a chronic problem.  Diabetes She presents for her follow-up diabetic visit. She has type 2 diabetes mellitus. Pertinent negatives for hypoglycemia include no nervousness/anxiousness or tremors. Pertinent negatives for diabetes include no  fatigue, no visual change and no weight loss.  Hypertension Associated symptoms include anxiety. Pertinent negatives include no palpitations. Identifiable causes of hypertension include a thyroid problem.  Anxiety Presents for follow-up visit. Symptoms include depressed mood. Patient reports no nervous/anxious behavior or palpitations.    Thyroid Problem Presents for follow-up visit. Symptoms include cold intolerance, constipation, depressed mood, hair loss, hoarse voice and weight gain. Patient reports no anxiety, diaphoresis, diarrhea, dry skin, fatigue, heat intolerance, leg swelling, menstrual problem, nail problem, palpitations, tremors, visual change or weight loss. Her past medical history is significant for hyperlipidemia.   Chief Complaint  Patient presents with  . Hyperlipidemia  . Diabetes  . Hypertension  . Chronic Kidney Disease  . Anxiety  . Urinary Frequency    With some urinary burning and pressure     Relevant past medical, surgical, family and social history reviewed and updated as indicated. Interim medical history since our last visit reviewed. Allergies and medications reviewed and updated.  Review of Systems  Constitutional:  Positive for weight gain. Negative for diaphoresis, fatigue and weight loss.  HENT:  Positive for hoarse voice.   Cardiovascular:  Negative for palpitations.  Gastrointestinal:  Positive for constipation. Negative for diarrhea.  Endocrine: Positive for cold intolerance. Negative for heat intolerance.  Genitourinary:  Positive for frequency. Negative for menstrual problem.  Neurological:  Negative for tremors.  Psychiatric/Behavioral:  The patient is not nervous/anxious.     Per HPI unless specifically indicated above     Objective:    There were no vitals taken for this visit.  Wt Readings from Last 3  Encounters:  09/27/20 275 lb 6.4 oz (124.9 kg)  06/29/20 272 lb (123.4 kg)  03/29/20 281 lb 6.4 oz (127.6 kg)    Physical  Exam  Results for orders placed or performed in visit on 10/12/20  Urine Culture   Specimen: Urine   UR  Result Value Ref Range   Urine Culture, Routine Final report (A)    Organism ID, Bacteria Klebsiella pneumoniae (A)    Antimicrobial Susceptibility Comment   Microscopic Examination   Urine  Result Value Ref Range   WBC, UA 0-5 0 - 5 /hpf   RBC 0-2 0 - 2 /hpf   Epithelial Cells (non renal) 0-10 0 - 10 /hpf   Casts Present (A) None seen /lpf   Cast Type Hyaline casts N/A   Mucus, UA Present (A) Not Estab.   Bacteria, UA Many (A) None seen/Few  Microalbumin, Urine Waived  Result Value Ref Range   Microalb, Ur Waived 30 (H) 0 - 19 mg/L   Creatinine, Urine Waived 300 10 - 300 mg/dL   Microalb/Creat Ratio <30 <30 mg/g  Lipid panel  Result Value Ref Range   Cholesterol, Total 145 100 - 199 mg/dL   Triglycerides 129 0 - 149 mg/dL   HDL 35 (L) >39 mg/dL   VLDL Cholesterol Cal 23 5 - 40 mg/dL   LDL Chol Calc (NIH) 87 0 - 99 mg/dL   Chol/HDL Ratio 4.1 0.0 - 4.4 ratio  CBC with Differential/Platelet  Result Value Ref Range   WBC 5.6 3.4 - 10.8 x10E3/uL   RBC 4.64 3.77 - 5.28 x10E6/uL   Hemoglobin 12.8 11.1 - 15.9 g/dL   Hematocrit 39.2 34.0 - 46.6 %   MCV 85 79 - 97 fL   MCH 27.6 26.6 - 33.0 pg   MCHC 32.7 31.5 - 35.7 g/dL   RDW 14.1 11.7 - 15.4 %   Platelets 285 150 - 450 x10E3/uL   Neutrophils 58 Not Estab. %   Lymphs 29 Not Estab. %   Monocytes 8 Not Estab. %   Eos 4 Not Estab. %   Basos 1 Not Estab. %   Neutrophils Absolute 3.3 1.4 - 7.0 x10E3/uL   Lymphocytes Absolute 1.6 0.7 - 3.1 x10E3/uL   Monocytes Absolute 0.5 0.1 - 0.9 x10E3/uL   EOS (ABSOLUTE) 0.2 0.0 - 0.4 x10E3/uL   Basophils Absolute 0.1 0.0 - 0.2 x10E3/uL   Immature Granulocytes 0 Not Estab. %   Immature Grans (Abs) 0.0 0.0 - 0.1 x10E3/uL  Thyroid Panel With TSH  Result Value Ref Range   TSH 10.200 (H) 0.450 - 4.500 uIU/mL   T4, Total 7.5 4.5 - 12.0 ug/dL   T3 Uptake Ratio 24 24 - 39 %   Free  Thyroxine Index 1.8 1.2 - 4.9  Bayer DCA Hb A1c Waived  Result Value Ref Range   HB A1C (BAYER DCA - WAIVED) 6.9 <7.0 %  Comprehensive metabolic panel  Result Value Ref Range   Glucose 148 (H) 65 - 99 mg/dL   BUN 26 8 - 27 mg/dL   Creatinine, Ser 2.01 (H) 0.57 - 1.00 mg/dL   eGFR 26 (L) >59 mL/min/1.73   BUN/Creatinine Ratio 13 12 - 28   Sodium 140 134 - 144 mmol/L   Potassium 4.0 3.5 - 5.2 mmol/L   Chloride 101 96 - 106 mmol/L   CO2 22 20 - 29 mmol/L   Calcium 9.1 8.7 - 10.3 mg/dL   Total Protein 7.1 6.0 - 8.5 g/dL   Albumin  4.2 3.8 - 4.8 g/dL   Globulin, Total 2.9 1.5 - 4.5 g/dL   Albumin/Globulin Ratio 1.4 1.2 - 2.2   Bilirubin Total 0.5 0.0 - 1.2 mg/dL   Alkaline Phosphatase 170 (H) 44 - 121 IU/L   AST 14 0 - 40 IU/L   ALT 14 0 - 32 IU/L  Urinalysis, Routine w reflex microscopic  Result Value Ref Range   Specific Gravity, UA 1.020 1.005 - 1.030   pH, UA 5.0 5.0 - 7.5   Color, UA Yellow Yellow   Appearance Ur Cloudy (A) Clear   Leukocytes,UA Trace (A) Negative   Protein,UA Negative Negative/Trace   Glucose, UA Negative Negative   Ketones, UA Negative Negative   RBC, UA Negative Negative   Bilirubin, UA Negative Negative   Urobilinogen, Ur 0.2 0.2 - 1.0 mg/dL   Nitrite, UA Negative Negative   Microscopic Examination See below:         Current Outpatient Medications:  .  albuterol (VENTOLIN HFA) 108 (90 Base) MCG/ACT inhaler, Inhale 2 puffs into the lungs every 6 (six) hours as needed for wheezing or shortness of breath., Disp: 1 each, Rfl: 2 .  aspirin 325 MG tablet, Take by mouth., Disp: , Rfl:  .  diazepam (VALIUM) 5 MG tablet, Take 1 tablet (5 mg total) by mouth daily as needed for anxiety., Disp: 20 tablet, Rfl: 0 .  diltiazem (CARDIZEM CD) 180 MG 24 hr capsule, TAKE 1 CAPSULE BY MOUTH EVERY DAY, Disp: 30 capsule, Rfl: 0 .  fexofenadine (ALLEGRA) 180 MG tablet, Take 1 tablet (180 mg total) by mouth daily. (Patient taking differently: Take 180 mg by mouth daily as  needed.), Disp: 100 tablet, Rfl: 1 .  fluticasone (FLONASE) 50 MCG/ACT nasal spray, SPRAY 2 SPRAYS INTO EACH NOSTRIL EVERY DAY, Disp: 16 g, Rfl: 4 .  furosemide (LASIX) 40 MG tablet, Take 1 tablet (40 mg total) by mouth daily., Disp: 90 tablet, Rfl: 4 .  losartan (COZAAR) 25 MG tablet, Take 1 tablet (25 mg total) by mouth daily., Disp: 90 tablet, Rfl: 4 .  methocarbamol (ROBAXIN) 750 MG tablet, TAKE 1/2 TO 1 TABLET BY MOUTH TWICE A DAY AS NEEDED, Disp: , Rfl:  .  PARoxetine (PAXIL) 40 MG tablet, TAKE 1 TABLET BY MOUTH EVERY DAY IN THE MORNING, Disp: 90 tablet, Rfl: 3 .  sitaGLIPtin (JANUVIA) 25 MG tablet, Take 1 tablet (25 mg total) by mouth daily., Disp: 90 tablet, Rfl: 4 .  TIADYLT ER 180 MG 24 hr capsule, Take 180 mg by mouth daily., Disp: , Rfl:  .  tiZANidine (ZANAFLEX) 4 MG tablet, Take 1 tablet (4 mg total) by mouth every 6 (six) hours as needed for muscle spasms., Disp: 30 tablet, Rfl: 0 .  traMADol (ULTRAM) 50 MG tablet, Take 1 tablet (50 mg total) by mouth every 12 (twelve) hours as needed for severe pain., Disp: 20 tablet, Rfl: 0 .  allopurinol (ZYLOPRIM) 100 MG tablet, Take 1 tablet (100 mg total) by mouth daily., Disp: 90 tablet, Rfl: 1 .  Evolocumab (REPATHA) 140 MG/ML SOSY, Inject 140 mg into the skin every 14 (fourteen) days. (Patient not taking: Reported on 10/18/2020), Disp: 2.1 mL, Rfl: 5 .  pregabalin (LYRICA) 25 MG capsule, Take 1 capsule (25 mg total) by mouth 2 (two) times daily., Disp: 180 capsule, Rfl: 3    Assessment & Plan:   1. CKD : sees nephrology for such    Ref. Range 03/29/2020 11:54 06/29/2020 11:15 06/29/2020 11:17  BUN Latest  Ref Range: 8 - 27 mg/dL 26   25  Creatinine Latest Ref Range: 0.57 - 1.00 mg/dL 1.73 (H)   2.28 (H)  Calcium Latest Ref Range: 8.7 - 10.3 mg/dL 9.2   9.0  BUN/Creatinine Ratio Latest Ref Range: 12 - '28  15   11 ' (L)  GFR, Est Non African American Latest Ref Range: >59 mL/min/1.73 29 (L)   21 (L)  GFR, Est African American Latest Ref Range:  >59 mL/min/1.73 34 (L)   24 (L)       2. Dm :  on januvia for such .a1c at 6.9  check HbA1c,  urine  microalbumin  diabetic diet plan given to pt  adviced regarding hypoglycemia and instructions given to pt today on how to prevent and treat the same if it were to occur. pt acknowledges the plan and voices understanding of the same.  exercise plan given and encouraged.   advice diabetic yearly podiatry, ophthalmology , nutritionist , dental check q 6 months,   3. SOB ? Sec to deconditioning per cards last  to fu with cards for a stress test   ECHO done same visit shows diastolic dysfunction on chart review  echo done per cards last year. 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  non smoker   4. Sleep apnea : not using cpap. doenst want to    5. Migraines - is on cardizem for such    6. HTn cozaar,  Continue current meds.  Medication compliance emphasised. pt advised to keep Bp logs. Pt verbalised understanding of the same. Pt to have a low salt diet . Exercise to reach a goal of at least 150 mins a week.  lifestyle modifications explained and pt understands importance of the above.   7. Depression on paxil for such  Valium as needed.   8. Elevated TSH  Will check US thyroid Start pt on Synthroid  Recheck levels.    Problem List Items Addressed This Visit   None      Follow up plan: No follow-ups on file.  Health Maintenance :

## 2020-10-18 NOTE — Telephone Encounter (Signed)
Future in 3 months

## 2020-10-19 ENCOUNTER — Other Ambulatory Visit: Payer: Self-pay | Admitting: Internal Medicine

## 2020-10-19 ENCOUNTER — Other Ambulatory Visit: Payer: Self-pay | Admitting: Nurse Practitioner

## 2020-10-20 ENCOUNTER — Ambulatory Visit: Payer: Self-pay

## 2020-10-20 NOTE — Telephone Encounter (Signed)
Called pt she declines appt states that she think that they will be fine and they will call if they get worse

## 2020-10-20 NOTE — Telephone Encounter (Signed)
FYI

## 2020-10-20 NOTE — Telephone Encounter (Signed)
Summary: please advise   Pt is calling and was offered virtual appt however the pt would like advise instead. Pt son tested positive for covid last night. Pt has sore throat and aching    Pt. Exposed to son who tested positive yesterday. Pt. Has sore throat,body aches.headache. Will do home test today. Declines virtual visit. Reviewed home care and quarantine.Will call back as needed. Answer Assessment - Initial Assessment Questions 1. COVID-19 EXPOSURE: "Please describe how you were exposed to someone with a COVID-19 infection."     Son 2. PLACE of CONTACT: "Where were you when you were exposed to COVID-19?" (e.g., home, school, medical waiting room; which city?)    Home 3. TYPE of CONTACT: "How much contact was there?" (e.g., sitting next to, live in same house, work in same office, same building)    Same house 4. DURATION of CONTACT: "How long were you in contact with the COVID-19 patient?" (e.g., a few seconds, passed by person, a few minutes, 15 minutes or longer, live with the patient)     Live together 5. MASK: "Were you wearing a mask?" "Was the other person wearing a mask?" Note: wearing a mask reduces the risk of an otherwise close contact.     No 6. DATE of CONTACT: "When did you have contact with a COVID-19 patient?" (e.g., how many days ago)     Sunday 7. COMMUNITY SPREAD: "Are there lots of cases of COVID-19 (community spread) where you live?" (See public health department website, if unsure)       Yes 8. SYMPTOMS: "Do you have any symptoms?" (e.g., fever, cough, breathing difficulty, loss of taste or smell)     Sore throat,body aches 9. VACCINE: "Have you gotten the COVID-19 vaccine?" If Yes, ask: "Which one, how many shots, when did you get it?"     Yes 10. BOOSTER: "Have you received your COVID-19 booster?" If Yes, ask: "Which one and when did you get it?"       Yes 11. PREGNANCY OR POSTPARTUM: "Is there any chance you are pregnant?" "When was your last menstrual period?"  "Did you deliver in the last 2 weeks?"       No 12. HIGH RISK: "Do you have any heart or lung problems?" (e.g., asthma , COPD, heart failure) "Do you have a weak immune system or other risk factors?" (e.g., HIV positive, chemotherapy, renal failure, diabetes mellitus, sickle cell anemia, obesity)       Yes 13. TRAVEL: "Have you traveled out of the country recently?" If Yes, ask: "When and where?"  Note: Travel becomes less relevant if there is widespread community transmission where the patient lives.       No  Protocols used: Coronavirus (COVID-19) Exposure-A-AH

## 2020-10-22 ENCOUNTER — Telehealth: Payer: Self-pay | Admitting: Nurse Practitioner

## 2020-10-22 MED ORDER — MOLNUPIRAVIR EUA 200MG CAPSULE: 4.0000 | ORAL_CAPSULE | Freq: Two times a day (BID) | ORAL | 0 refills | Status: AC

## 2020-10-22 NOTE — Telephone Encounter (Signed)
Patient called in regards to oral antiviral due to testing positive. She is requesting Paxlovid.  After speaking with her it was decided that Molnupiraivr was the better option.  It will be sent to [facility] but I explained that I don't know if they have it in stock. Patient will need to do a virtual visit on Monday for close follow up.

## 2020-10-24 ENCOUNTER — Telehealth (INDEPENDENT_AMBULATORY_CARE_PROVIDER_SITE_OTHER): Payer: Medicare Other | Admitting: Internal Medicine

## 2020-10-24 ENCOUNTER — Encounter: Payer: Self-pay | Admitting: Internal Medicine

## 2020-10-24 DIAGNOSIS — U071 COVID-19: Secondary | ICD-10-CM

## 2020-10-24 DIAGNOSIS — R062 Wheezing: Secondary | ICD-10-CM | POA: Diagnosis not present

## 2020-10-24 DIAGNOSIS — R059 Cough, unspecified: Secondary | ICD-10-CM

## 2020-10-24 MED ORDER — FEXOFENADINE HCL 180 MG PO TABS: 180.0000 mg | ORAL_TABLET | Freq: Every day | ORAL | 1 refills | Status: DC

## 2020-10-24 NOTE — Progress Notes (Signed)
There were no vitals taken for this visit.   Subjective:    Patient ID: Katie Singh, female    DOB: [birth year], 70 y.o.   MRN: [id number]  Chief Complaint  Patient presents with   Sore Throat   Sinusitis   Cough   Covid Positive    Tested positive on Friday 6/10, last    Fatigue    HPI: Katie Singh is a 70 y.o. female  I connected with  [name] on 10/24/20 by a video enabled telemedicine application and verified that I am speaking with the correct person using two identifiers.   I discussed the limitations of evaluation and management by telemedicine. The patient expressed understanding and agreed to proceed. "I discussed the limitations of evaluation and management by telemedicine and the availability of in person appointments. The patient expressed understanding and agreed to proceed"      This visit was completed via MyChart due to the restrictions of the COVID-19 pandemic. All issues as above were discussed and addressed. Physical exam was done as above through visual confirmation on MyChart. If it was felt that the patient should be evaluated in the office, they were directed there. The patient verbally consented to this visit. Location of the patient: home Location of the provider: work Those involved with this call:  Provider: Charlynne Cousins, MD CMA: Frazier Butt, [place] Desk/Registration: Levert Feinstein  Time spent on call: 10 minutes with patient face to face via video conference. More than 50% of this time was spent in counseling and coordination of care. 10 minutes total spent in review of patient's record and preparation of their chart.   Sore Throat  This is a new (statred sneezing runny nose, mouth feels funny, no loss of smell/ has had nausea and headache is also on molnupiravir feels about 20 - 30 % better than she was) problem. The current episode started in the past 7 days. Associated symptoms include congestion, coughing and headaches.  Pertinent negatives include no diarrhea, drooling, ear discharge, ear pain, hoarse voice, plugged ear sensation, neck pain, shortness of breath, stridor, swollen glands, trouble swallowing or vomiting.  Sinusitis This is a new problem. The current episode started in the past 7 days. Associated symptoms include congestion, coughing, headaches and sinus pressure. Pertinent negatives include no chills, ear pain, hoarse voice, neck pain, shortness of breath, sneezing, sore throat or swollen glands.  Cough This is a new (greenish now was brown) problem. The problem has been gradually improving. Associated symptoms include headaches. Pertinent negatives include no chest pain, chills, ear pain, sore throat or shortness of breath.   Chief Complaint  Patient presents with   Sore Throat   Sinusitis   Cough   Covid Positive    Tested positive on Friday 6/10, last    Fatigue    Relevant past medical, surgical, family and social history reviewed and updated as indicated. Interim medical history since our last visit reviewed. Allergies and medications reviewed and updated.  Review of Systems  Constitutional:  Negative for chills.  HENT:  Positive for congestion and sinus pressure. Negative for drooling, ear discharge, ear pain, hoarse voice, sneezing, sore throat and trouble swallowing.   Respiratory:  Positive for cough. Negative for shortness of breath and stridor.   Cardiovascular:  Negative for chest pain.  Gastrointestinal:  Negative for diarrhea and vomiting.  Musculoskeletal:  Negative for neck pain.  Neurological:  Positive for headaches.   Per HPI unless specifically indicated above  Objective:    There were no vitals taken for this visit.  Wt Readings from Last 3 Encounters:  09/27/20 275 lb 6.4 oz (124.9 kg)  06/29/20 272 lb (123.4 kg)  03/29/20 281 lb 6.4 oz (127.6 kg)    Physical Exam Vitals reviewed: not done sec to virtual visit..  Constitutional:      General: She is  not in acute distress.    Appearance: She is well-developed. She is not ill-appearing.  HENT:     Head: Normocephalic.  Neurological:     Mental Status: She is alert.  Psychiatric:        Mood and Affect: Mood normal.        Behavior: Behavior normal.   Results for orders placed or performed in visit on 10/12/20  Urine Culture   Specimen: Urine   UR  Result Value Ref Range   Urine Culture, Routine Final report (A)    Organism ID, Bacteria Klebsiella pneumoniae (A)    Antimicrobial Susceptibility Comment   Microscopic Examination   Urine  Result Value Ref Range   WBC, UA 0-5 0 - 5 /hpf   RBC 0-2 0 - 2 /hpf   Epithelial Cells (non renal) 0-10 0 - 10 /hpf   Casts Present (A) None seen /lpf   Cast Type Hyaline casts N/A   Mucus, UA Present (A) Not Estab.   Bacteria, UA Many (A) None seen/Few  Microalbumin, Urine Waived  Result Value Ref Range   Microalb, Ur Waived 30 (H) 0 - 19 mg/L   Creatinine, Urine Waived 300 10 - 300 mg/dL   Microalb/Creat Ratio <30 <30 mg/g  Lipid panel  Result Value Ref Range   Cholesterol, Total 145 100 - 199 mg/dL   Triglycerides 129 0 - 149 mg/dL   HDL 35 (L) >39 mg/dL   VLDL Cholesterol Cal 23 5 - 40 mg/dL   LDL Chol Calc (NIH) 87 0 - 99 mg/dL   Chol/HDL Ratio 4.1 0.0 - 4.4 ratio  CBC with Differential/Platelet  Result Value Ref Range   WBC 5.6 3.4 - 10.8 x10E3/uL   RBC 4.64 3.77 - 5.28 x10E6/uL   Hemoglobin 12.8 11.1 - 15.9 g/dL   Hematocrit 39.2 34.0 - 46.6 %   MCV 85 79 - 97 fL   MCH 27.6 26.6 - 33.0 pg   MCHC 32.7 31.5 - 35.7 g/dL   RDW 14.1 11.7 - 15.4 %   Platelets 285 150 - 450 x10E3/uL   Neutrophils 58 Not Estab. %   Lymphs 29 Not Estab. %   Monocytes 8 Not Estab. %   Eos 4 Not Estab. %   Basos 1 Not Estab. %   Neutrophils Absolute 3.3 1.4 - 7.0 x10E3/uL   Lymphocytes Absolute 1.6 0.7 - 3.1 x10E3/uL   Monocytes Absolute 0.5 0.1 - 0.9 x10E3/uL   EOS (ABSOLUTE) 0.2 0.0 - 0.4 x10E3/uL   Basophils Absolute 0.1 0.0 - 0.2 x10E3/uL    Immature Granulocytes 0 Not Estab. %   Immature Grans (Abs) 0.0 0.0 - 0.1 x10E3/uL  Thyroid Panel With TSH  Result Value Ref Range   TSH 10.200 (H) 0.450 - 4.500 uIU/mL   T4, Total 7.5 4.5 - 12.0 ug/dL   T3 Uptake Ratio 24 24 - 39 %   Free Thyroxine Index 1.8 1.2 - 4.9  Bayer DCA Hb A1c Waived  Result Value Ref Range   HB A1C (BAYER DCA - WAIVED) 6.9 <7.0 %  Comprehensive metabolic panel  Result Value Ref  Range   Glucose 148 (H) 65 - 99 mg/dL   BUN 26 8 - 27 mg/dL   Creatinine, Ser 2.01 (H) 0.57 - 1.00 mg/dL   eGFR 26 (L) >59 mL/min/1.73   BUN/Creatinine Ratio 13 12 - 28   Sodium 140 134 - 144 mmol/L   Potassium 4.0 3.5 - 5.2 mmol/L   Chloride 101 96 - 106 mmol/L   CO2 22 20 - 29 mmol/L   Calcium 9.1 8.7 - 10.3 mg/dL   Total Protein 7.1 6.0 - 8.5 g/dL   Albumin 4.2 3.8 - 4.8 g/dL   Globulin, Total 2.9 1.5 - 4.5 g/dL   Albumin/Globulin Ratio 1.4 1.2 - 2.2   Bilirubin Total 0.5 0.0 - 1.2 mg/dL   Alkaline Phosphatase 170 (H) 44 - 121 IU/L   AST 14 0 - 40 IU/L   ALT 14 0 - 32 IU/L  Urinalysis, Routine w reflex microscopic  Result Value Ref Range   Specific Gravity, UA 1.020 1.005 - 1.030   pH, UA 5.0 5.0 - 7.5   Color, UA Yellow Yellow   Appearance Ur Cloudy (A) Clear   Leukocytes,UA Trace (A) Negative   Protein,UA Negative Negative/Trace   Glucose, UA Negative Negative   Ketones, UA Negative Negative   RBC, UA Negative Negative   Bilirubin, UA Negative Negative   Urobilinogen, Ur 0.2 0.2 - 1.0 mg/dL   Nitrite, UA Negative Negative   Microscopic Examination See below:         Current Outpatient Medications:    albuterol (VENTOLIN HFA) 108 (90 Base) MCG/ACT inhaler, TAKE 2 PUFFS BY MOUTH EVERY 6 HOURS AS NEEDED FOR WHEEZE OR SHORTNESS OF BREATH, Disp: 1 each, Rfl: 2   allopurinol (ZYLOPRIM) 100 MG tablet, Take 1 tablet (100 mg total) by mouth daily., Disp: 90 tablet, Rfl: 1   aspirin 325 MG tablet, Take by mouth., Disp: , Rfl:    diazepam (VALIUM) 5 MG tablet, Take  1 tablet (5 mg total) by mouth daily as needed for anxiety., Disp: 20 tablet, Rfl: 0   diltiazem (CARDIZEM CD) 180 MG 24 hr capsule, TAKE 1 CAPSULE BY MOUTH EVERY DAY, Disp: 30 capsule, Rfl: 0   fexofenadine (ALLEGRA) 180 MG tablet, Take 1 tablet (180 mg total) by mouth daily. (Patient taking differently: Take 180 mg by mouth daily as needed.), Disp: 100 tablet, Rfl: 1   fluticasone (FLONASE) 50 MCG/ACT nasal spray, SPRAY 2 SPRAYS INTO EACH NOSTRIL EVERY DAY, Disp: 16 g, Rfl: 4   furosemide (LASIX) 40 MG tablet, Take 1 tablet (40 mg total) by mouth daily., Disp: 90 tablet, Rfl: 4   levothyroxine (SYNTHROID) 25 MCG tablet, Take 1 tablet (25 mcg total) by mouth daily., Disp: 30 tablet, Rfl: 3   losartan (COZAAR) 25 MG tablet, Take 1 tablet (25 mg total) by mouth daily., Disp: 90 tablet, Rfl: 4   methocarbamol (ROBAXIN) 750 MG tablet, TAKE 1/2 TO 1 TABLET BY MOUTH TWICE A DAY AS NEEDED, Disp: , Rfl:    molnupiravir EUA 200 mg CAPS, Take 4 capsules (800 mg total) by mouth 2 (two) times daily for 5 days., Disp: 40 capsule, Rfl: 0   PARoxetine (PAXIL) 40 MG tablet, TAKE 1 TABLET BY MOUTH EVERY DAY IN THE MORNING, Disp: 90 tablet, Rfl: 3   pregabalin (LYRICA) 25 MG capsule, Take 1 capsule (25 mg total) by mouth 2 (two) times daily., Disp: 180 capsule, Rfl: 3   REPATHA 140 MG/ML SOSY, INJECT 140 MG INTO THE SKIN EVERY 14 (  FOURTEEN) DAYS., Disp: 2.1 mL, Rfl: 1   sitaGLIPtin (JANUVIA) 25 MG tablet, Take 1 tablet (25 mg total) by mouth daily., Disp: 90 tablet, Rfl: 4   TIADYLT ER 180 MG 24 hr capsule, Take 180 mg by mouth daily., Disp: , Rfl:    tiZANidine (ZANAFLEX) 4 MG tablet, Take 1 tablet (4 mg total) by mouth every 6 (six) hours as needed for muscle spasms., Disp: 30 tablet, Rfl: 0   traMADol (ULTRAM) 50 MG tablet, Take 1 tablet (50 mg total) by mouth every 12 (twelve) hours as needed for severe pain., Disp: 20 tablet, Rfl: 0    Assessment & Plan:   COVID : positive :  Increase fluid intake. Continue  Molnupiravir for such  Consider CXR  Headache - tyelnol every 4-6 hrs prn and alternate this with ibubrufen 800 mg q 8 hrly.sinus pressure: use steam inhalation. OTC -  Allegra / claritin. 5 days quarantine.  Ok to rtw in 5 days if tests -ve follow .

## 2020-10-24 NOTE — Telephone Encounter (Signed)
Pt has apt today with DR Vigg at 4:00 virtual

## 2020-10-24 NOTE — Telephone Encounter (Signed)
Called pt to scheduled virtual no answer no vm

## 2020-10-27 ENCOUNTER — Telehealth (INDEPENDENT_AMBULATORY_CARE_PROVIDER_SITE_OTHER): Payer: Medicare Other | Admitting: Internal Medicine

## 2020-10-27 ENCOUNTER — Encounter: Payer: Self-pay | Admitting: Internal Medicine

## 2020-10-27 ENCOUNTER — Other Ambulatory Visit: Payer: Self-pay

## 2020-10-27 DIAGNOSIS — U071 COVID-19: Secondary | ICD-10-CM | POA: Diagnosis not present

## 2020-10-27 MED ORDER — ONDANSETRON 8 MG PO TBDP: 8.0000 mg | ORAL_TABLET | Freq: Three times a day (TID) | ORAL | 0 refills | Status: DC | PRN

## 2020-10-27 NOTE — Progress Notes (Signed)
   There were no vitals taken for this visit.   Subjective:    Patient ID: Katie Singh, female    DOB: 1950-08-27, 70 y.o.   MRN: [id number]  Chief Complaint  Patient presents with  . Covid Positive    Following up after taking Molnupiravir. Patient has been feeling nausea    HPI: Katie Singh is a 70 y.o. female  Pt is s/p m Pt says she feels nauseated s/p molnupiravir  Nfever or chills, no myalgias. A whole lot better per Katie Singh. Her son and husband were sick as well.   Cough This is a new problem. Pertinent negatives include no chest pain, chills, ear congestion, ear pain, fever, headaches, heartburn, hemoptysis, myalgias, nasal congestion, postnasal drip, rash, rhinorrhea, sore throat, shortness of breath, sweats, weight loss or wheezing.   Chief Complaint  Patient presents with  . Covid Positive    Following up after taking Molnupiravir. Patient has been feeling nausea    Relevant past medical, surgical, family and social history reviewed and updated as indicated. Interim medical history since our last visit reviewed. Allergies and medications reviewed and updated.  Review of Systems  Constitutional:  Negative for chills, fever and weight loss.  HENT:  Negative for ear pain, postnasal drip, rhinorrhea and sore throat.   Respiratory:  Positive for cough. Negative for hemoptysis, shortness of breath and wheezing.   Cardiovascular:  Negative for chest pain.  Gastrointestinal:  Negative for heartburn.  Musculoskeletal:  Negative for myalgias.  Skin:  Negative for rash.  Neurological:  Negative for headaches.   Per HPI unless specifically indicated above     Objective:    There were no vitals taken for this visit.  Wt Readings from Last 3 Encounters:  09/27/20 275 lb 6.4 oz (124.9 kg)  06/29/20 272 lb (123.4 kg)  03/29/20 281 lb 6.4 oz (127.6 kg)    Physical Exam  Unable to peform sec to virtual visit.            Assessment & Plan:  COVID  +ve stable, pt feels better s/p Molnupiravir for such  Stable, better improving since last seen  Fu with me as scheduled.   Problem List Items Addressed This Visit   None    No orders of the defined types were placed in this encounter.    Meds ordered this encounter  Medications  . ondansetron (ZOFRAN-ODT) 8 MG disintegrating tablet    Sig: Take 1 tablet (8 mg total) by mouth every 8 (eight) hours as needed for nausea or vomiting.    Dispense:  20 tablet    Refill:  0     Follow up plan: No follow-ups on file.

## 2020-10-28 ENCOUNTER — Telehealth: Payer: Self-pay

## 2020-10-28 NOTE — Telephone Encounter (Signed)
PA started through Cover my meds for Ondansetron 8mg  tab. Awaiting determination

## 2020-10-31 ENCOUNTER — Telehealth: Payer: Self-pay

## 2020-10-31 NOTE — Telephone Encounter (Signed)
Patient notified of Approval for Ondansetron 8mg 

## 2020-10-31 NOTE — Telephone Encounter (Signed)
Copied from [place] [id number]. Topic: General - Other >> Oct 28, 2020  5:18 PM Yvette Rack wrote: Reason for CRM: [facility] with [facility] called to give verbal approval for prior authorization for Ondansetron 8 MG tablet. Cb# [phone] Option 5

## 2020-11-01 ENCOUNTER — Other Ambulatory Visit: Payer: Self-pay | Admitting: Nurse Practitioner

## 2020-11-01 DIAGNOSIS — E1159 Type 2 diabetes mellitus with other circulatory complications: Secondary | ICD-10-CM

## 2020-11-01 DIAGNOSIS — I152 Hypertension secondary to endocrine disorders: Secondary | ICD-10-CM

## 2020-11-01 NOTE — Telephone Encounter (Signed)
  Notes to clinic: REQUEST FOR 90 DAYS PRESCRIPTION.   Requested Prescriptions  Pending Prescriptions Disp Refills   diltiazem (CARDIZEM CD) 180 MG 24 hr capsule [Pharmacy Med Name: DILTIAZEM 24H ER(CD) 180 MG CP] 90 capsule 1    Sig: TAKE 1 CAPSULE BY MOUTH EVERY DAY      Cardiovascular:  Calcium Channel Blockers Passed - 11/01/2020  8:37 AM      Passed - Last BP in normal range    BP Readings from Last 1 Encounters:  09/27/20 112/78          Passed - Valid encounter within last 6 months    Recent Outpatient Visits           5 days ago    [facility] Vigg, Avanti, MD   1 week ago [place] Vigg, Avanti, MD   2 weeks ago Hypothyroidism, unspecified type   [facility] Vigg, Avanti, MD   1 month ago Heart rate fast   [place], MD   4 months ago Type 2 diabetes mellitus with stage 4 chronic kidney disease, without long-term current use of insulin ([place])   [place], Barbaraann Faster, NP       Future Appointments             In 2 months [place], [facility]

## 2020-11-01 NOTE — Telephone Encounter (Signed)
Would pt need apt? 

## 2020-11-07 NOTE — Telephone Encounter (Signed)
pl see If pt sees cardiology for such - has 2 different ca channel blockers on file. Please verify if on both. Thnx.cannot refill cardizem if also taking another calcium channel blocker last filled 4/ 22

## 2020-11-09 DIAGNOSIS — R0602 Shortness of breath: Secondary | ICD-10-CM | POA: Diagnosis not present

## 2020-11-11 NOTE — Telephone Encounter (Signed)
Dr. Neomia Dear, I don't see a second calcium channel blocker on the patients chart. What other one is she taking besides the Diltiazem?

## 2020-11-15 NOTE — Telephone Encounter (Signed)
Called and spoke to patient. She states that she does not take this for her heart or BP. She states that Dr. Jeananne Rama previously wrote this and that she takes it for her migraines.

## 2020-11-16 ENCOUNTER — Ambulatory Visit: Payer: Medicare Other | Attending: Internal Medicine

## 2020-11-21 ENCOUNTER — Encounter: Payer: Self-pay | Admitting: Internal Medicine

## 2020-11-23 DIAGNOSIS — I10 Essential (primary) hypertension: Secondary | ICD-10-CM | POA: Diagnosis not present

## 2020-11-23 DIAGNOSIS — E782 Mixed hyperlipidemia: Secondary | ICD-10-CM | POA: Diagnosis not present

## 2020-11-23 DIAGNOSIS — I350 Nonrheumatic aortic (valve) stenosis: Secondary | ICD-10-CM | POA: Insufficient documentation

## 2020-11-23 DIAGNOSIS — N1832 Chronic kidney disease, stage 3b: Secondary | ICD-10-CM | POA: Diagnosis not present

## 2020-11-29 ENCOUNTER — Other Ambulatory Visit: Payer: Self-pay | Admitting: Internal Medicine

## 2020-12-10 ENCOUNTER — Other Ambulatory Visit: Payer: Self-pay | Admitting: Internal Medicine

## 2020-12-10 NOTE — Telephone Encounter (Signed)
Requested Prescriptions  Pending Prescriptions Disp Refills  . fexofenadine (ALLEGRA) 180 MG tablet [Pharmacy Med Name: FEXOFENADINE HCL 180 MG TABLET] 15 tablet 1    Sig: TAKE 1 TABLET BY MOUTH EVERY DAY     Ear, Nose, and Throat:  Antihistamines Passed - 12/10/2020  8:33 AM      Passed - Valid encounter within last 12 months    Recent Outpatient Visits          1 month ago [place] Vigg, Avanti, MD   1 month ago COVID-19   [facility] Vigg, Avanti, MD   1 month ago Hypothyroidism, unspecified type   [facility] Vigg, Avanti, MD   2 months ago Heart rate fast   [place] Vigg, Avanti, MD   5 months ago Type 2 diabetes mellitus with stage 4 chronic kidney disease, without long-term current use of insulin ([place])   [place], Barbaraann Faster, NP      Future Appointments            In 1 month [place], [facility]

## 2020-12-13 ENCOUNTER — Other Ambulatory Visit: Payer: Self-pay | Admitting: Internal Medicine

## 2020-12-13 NOTE — Telephone Encounter (Signed)
Can this RX be changed to a 90 day supply please?

## 2020-12-13 NOTE — Telephone Encounter (Signed)
Requested medication (s) are due for refill today:   Yes  Requested medication (s) are on the active medication list:   Yes  Future visit scheduled:   No   Last ordered: 12/10/2020 #15, 1 refill  Returned because a 90 day supply is being requested.   Requested Prescriptions  Pending Prescriptions Disp Refills   fexofenadine (ALLEGRA) 180 MG tablet [Pharmacy Med Name: FEXOFENADINE HCL 180 MG TABLET] 90 tablet 1    Sig: TAKE 1 TABLET BY MOUTH EVERY DAY      Ear, Nose, and Throat:  Antihistamines Passed - 12/13/2020  8:03 AM      Passed - Valid encounter within last 12 months    Recent Outpatient Visits           1 month ago [place] Vigg, Avanti, MD   1 month ago COVID-19   [facility] Vigg, Avanti, MD   1 month ago Hypothyroidism, unspecified type   [facility] Vigg, Avanti, MD   2 months ago Heart rate fast   [place] Vigg, Avanti, MD   5 months ago Type 2 diabetes mellitus with stage 4 chronic kidney disease, without long-term current use of insulin ([place])   [place], Barbaraann Faster, NP       Future Appointments             In 63 month [place], [facility]

## 2020-12-14 ENCOUNTER — Other Ambulatory Visit: Payer: Self-pay | Admitting: Internal Medicine

## 2020-12-28 ENCOUNTER — Telehealth: Payer: Self-pay

## 2020-12-28 NOTE — Telephone Encounter (Signed)
Rep from [facility] calling because this is the first time with them for this Rx. Repatha This PA needs to be an "initial" and not a renewal.   1 Did pt have an inadequate response to atorvastatin or rosuvastatin? 2.  List all other statins the pt had intolerance to.  Cb  [phone] option 5 Case number  [id number]

## 2020-12-28 NOTE — Telephone Encounter (Signed)
PA initiated for Repatha 140 MG/ML syringes through cover my meds, awaiting on determination.

## 2020-12-28 NOTE — Telephone Encounter (Signed)
Spoke with Rep from [employer]. Informed them that the PA is in deed a renewal not for initial therapy, and that she has tried and failed, Atorvastatin and rosuvastatin. Rep stated that she would send the PA through and we should be receiving a call or fax with determination.

## 2020-12-29 ENCOUNTER — Telehealth: Payer: Self-pay

## 2020-12-29 NOTE — Telephone Encounter (Signed)
PA for Repatha has been approved through the end of 12/28/2021

## 2021-01-06 ENCOUNTER — Other Ambulatory Visit: Payer: Self-pay | Admitting: Internal Medicine

## 2021-01-06 ENCOUNTER — Other Ambulatory Visit: Payer: Self-pay | Admitting: Nurse Practitioner

## 2021-01-06 NOTE — Telephone Encounter (Signed)
Requested medication (s) are due for refill today: Yes  Requested medication (s) are on the active medication list: Yes  Last refill:  10/18/20  Future visit scheduled: Yes  Notes to clinic: Needs labs.    Requested Prescriptions  Pending Prescriptions Disp Refills   levothyroxine (SYNTHROID) 25 MCG tablet [Pharmacy Med Name: LEVOTHYROXINE 25 MCG TABLET] 90 tablet 1    Sig: TAKE 1 TABLET BY MOUTH EVERY DAY     Endocrinology:  Hypothyroid Agents Failed - 01/06/2021  8:10 AM      Failed - TSH needs to be rechecked within 3 months after an abnormal result. Refill until TSH is due.      Failed - TSH in normal range and within 360 days    TSH  Date Value Ref Range Status  10/12/2020 10.200 (H) 0.450 - 4.500 uIU/mL Final          Passed - Valid encounter within last 12 months    Recent Outpatient Visits           2 months ago [place] Vigg, Avanti, MD   2 months ago COVID-25   [facility] Vigg, Customer service manager, MD   2 months ago Hypothyroidism, unspecified type   [facility] Vigg, Avanti, MD   3 months ago Heart rate fast   [place], MD   6 months ago Type 2 diabetes mellitus with stage 4 chronic kidney disease, without long-term current use of insulin ([place])   [place], Barbaraann Faster, NP       Future Appointments             In 2 weeks [facility], [facility]

## 2021-01-06 NOTE — Telephone Encounter (Signed)
Patient last seen in June.

## 2021-01-06 NOTE — Telephone Encounter (Signed)
Requested medication (s) are due for refill today - yes  Requested medication (s) are on the active medication list -yes  Future visit scheduled -yes  Last refill: 11/29/20  Notes to clinic: Request RF: non delegated Rx  Requested Prescriptions  Pending Prescriptions Disp Refills   pregabalin (LYRICA) 25 MG capsule [Pharmacy Med Name: PREGABALIN 25 MG CAPSULE] 60 capsule     Sig: TAKE 1 CAPSULE BY MOUTH 2 TIMES DAILY.     Not Delegated - Neurology:  Anticonvulsants - Controlled Failed - 01/06/2021  8:10 AM      Failed - This refill cannot be delegated      Passed - Valid encounter within last 12 months    Recent Outpatient Visits           2 months ago [place] Vigg, Avanti, MD   2 months ago COVID-19   [facility] Vigg, Customer service manager, MD   2 months ago Hypothyroidism, unspecified type   [facility] Vigg, Avanti, MD   3 months ago Heart rate fast   [place] Vigg, Avanti, MD   6 months ago Type 2 diabetes mellitus with stage 4 chronic kidney disease, without long-term current use of insulin ([place])   [place], Barbaraann Faster, NP       Future Appointments             In 2 weeks [place], [facility]                Requested Prescriptions  Pending Prescriptions Disp Refills   pregabalin (LYRICA) 25 MG capsule [Pharmacy Med Name: PREGABALIN 25 MG CAPSULE] 60 capsule     Sig: TAKE 1 CAPSULE BY MOUTH 2 TIMES DAILY.     Not Delegated - Neurology:  Anticonvulsants - Controlled Failed - 01/06/2021  8:10 AM      Failed - This refill cannot be delegated      Passed - Valid encounter within last 12 months    Recent Outpatient Visits           2 months ago [place] Vigg, Avanti, MD   2 months ago COVID-19   [facility] Vigg, Customer service manager, MD   2 months ago Hypothyroidism, unspecified type   [facility] Vigg, Avanti, MD   3 months ago  Heart rate fast   [place], MD   6 months ago Type 2 diabetes mellitus with stage 4 chronic kidney disease, without long-term current use of insulin ([place])   [place], Barbaraann Faster, NP       Future Appointments             In 2 weeks [facility], [facility]

## 2021-01-15 ENCOUNTER — Other Ambulatory Visit: Payer: Self-pay | Admitting: Internal Medicine

## 2021-01-15 NOTE — Telephone Encounter (Signed)
Called [facility] and spoke with pharmacist. Pt still has one-30 day refill- pharmacist stated she will fill it and call pt.

## 2021-01-18 ENCOUNTER — Ambulatory Visit: Payer: Medicare HMO

## 2021-01-20 ENCOUNTER — Ambulatory Visit (INDEPENDENT_AMBULATORY_CARE_PROVIDER_SITE_OTHER): Payer: Medicare Other

## 2021-01-20 VITALS — Ht 68.0 in | Wt 260.0 lb

## 2021-01-20 DIAGNOSIS — Z0000 Encounter for general adult medical examination without abnormal findings: Secondary | ICD-10-CM

## 2021-01-20 NOTE — Progress Notes (Signed)
I connected with Elvie Palomo today by telephone and verified that I am speaking with the correct person using two identifiers. Location patient: home Location provider: work Persons participating in the virtual visit: Marvina Danner, Glenna Durand LPN.   I discussed the limitations, risks, security and privacy concerns of performing an evaluation and management service by telephone and the availability of in person appointments. I also discussed with the patient that there may be a patient responsible charge related to this service. The patient expressed understanding and verbally consented to this telephonic visit.    Interactive audio and video telecommunications were attempted between this provider and patient, however failed, due to patient having technical difficulties OR patient did not have access to video capability.  We continued and completed visit with audio only.     Vital signs may be patient reported or missing.  Subjective:   KORTNE ALL is a 70 y.o. female who presents for Medicare Annual (Subsequent) preventive examination.  Review of Systems     Cardiac Risk Factors include: advanced age (>41men, >28 women);diabetes mellitus;obesity (BMI >30kg/m2);sedentary lifestyle     Objective:    Today's Vitals   01/20/21 1426 01/20/21 1427  Weight: 260 lb (117.9 kg)   Height: 5\' 8"  (1.727 m)   PainSc:  5    Body mass index is 39.53 kg/m.  Advanced Directives 01/20/2021 01/15/2020 12/12/2016 01/19/2016 01/10/2016  Does Patient Have a Medical Advance Directive? No No No No No  Would patient like information on creating a medical advance directive? - - Yes (MAU/Ambulatory/Procedural Areas - Information given) No - patient declined information No - patient declined information    Current Medications (verified) Outpatient Encounter Medications as of 01/20/2021  Medication Sig   albuterol (VENTOLIN HFA) 108 (90 Base) MCG/ACT inhaler TAKE 2 PUFFS BY MOUTH EVERY 6 HOURS AS NEEDED  FOR WHEEZE OR SHORTNESS OF BREATH   allopurinol (ZYLOPRIM) 100 MG tablet Take 1 tablet (100 mg total) by mouth daily.   aspirin 325 MG tablet Take by mouth.   diazepam (VALIUM) 5 MG tablet Take 1 tablet (5 mg total) by mouth daily as needed for anxiety.   diltiazem (CARDIZEM CD) 180 MG 24 hr capsule TAKE 1 CAPSULE BY MOUTH EVERY DAY   fexofenadine (ALLEGRA) 180 MG tablet TAKE 1 TABLET BY MOUTH EVERY DAY   fluticasone (FLONASE) 50 MCG/ACT nasal spray SPRAY 2 SPRAYS INTO EACH NOSTRIL EVERY DAY   furosemide (LASIX) 40 MG tablet Take 1 tablet (40 mg total) by mouth daily.   losartan (COZAAR) 25 MG tablet Take 1 tablet (25 mg total) by mouth daily.   methocarbamol (ROBAXIN) 750 MG tablet TAKE 1/2 TO 1 TABLET BY MOUTH TWICE A DAY AS NEEDED   PARoxetine (PAXIL) 40 MG tablet TAKE 1 TABLET BY MOUTH EVERY DAY IN THE MORNING   pregabalin (LYRICA) 25 MG capsule TAKE 1 CAPSULE BY MOUTH 2 TIMES DAILY.   sitaGLIPtin (JANUVIA) 25 MG tablet Take 1 tablet (25 mg total) by mouth daily.   TIADYLT ER 180 MG 24 hr capsule Take 180 mg by mouth daily.   tiZANidine (ZANAFLEX) 4 MG tablet Take 1 tablet (4 mg total) by mouth every 6 (six) hours as needed for muscle spasms.   traMADol (ULTRAM) 50 MG tablet Take 1 tablet (50 mg total) by mouth every 12 (twelve) hours as needed for severe pain.   levothyroxine (SYNTHROID) 25 MCG tablet Take 1 tablet (25 mcg total) by mouth daily. (Patient not taking: Reported on 01/20/2021)  ondansetron (ZOFRAN-ODT) 8 MG disintegrating tablet Take 1 tablet (8 mg total) by mouth every 8 (eight) hours as needed for nausea or vomiting. (Patient not taking: Reported on 01/20/2021)   REPATHA 140 MG/ML SOSY INJECT 140 MG INTO THE SKIN EVERY 14 (FOURTEEN) DAYS. (Patient not taking: Reported on 01/20/2021)   No facility-administered encounter medications on file as of 01/20/2021.    Allergies (verified) Clarithromycin, Abilify [aripiprazole], Amoxicillin-pot clavulanate, Ciprofloxacin, Lisinopril,  Lorcaserin, Macrodantin [nitrofurantoin macrocrystal], Olanzapine, Pantoprazole, Sulfa antibiotics, and Penicillins   History: Past Medical History:  Diagnosis Date   Anemia    history of   Chronic kidney disease    stage 3-4   Complication of anesthesia    Diabetes mellitus without complication (HCC)    diet controlled   GERD (gastroesophageal reflux disease)    Hepatitis    fatty live   History of kidney stones    Hyperlipidemia    history of, no longer taking meds.   Hypertension    Klebsiella infection    Migraine headache with aura    history of   MSSA (methicillin susceptible Staphylococcus aureus)    Myelodysplasia    OA (osteoarthritis)    PONV (postoperative nausea and vomiting)    Positive anti-CCP test    followed by rheumatology   Rheumatic fever in pediatric patient 1961   Past Surgical History:  Procedure Laterality Date   CATARACT EXTRACTION Left    DISTAL INTERPHALANGEAL JOINT FUSION Right 01/19/2016   Procedure: Seba Dalkai;  Surgeon: Hessie Knows, MD;  Location: [facility] ORS;  Service: Orthopedics;  Laterality: Right;   ENDOMETRIAL ABLATION     TUBAL LIGATION  1988   Family History  Problem Relation Age of Onset   Cancer Father    Diabetes Mother    Heart disease Mother    Social History   Socioeconomic History   Marital status: Divorced    Spouse name: Not on file   Number of children: Not on file   Years of education: Not on file   Highest education level: Not on file  Occupational History   Occupation: retired  Tobacco Use   Smoking status: Never   Smokeless tobacco: Never  Vaping Use   Vaping Use: Never used  Substance and Sexual Activity   Alcohol use: No    Comment:  once every 2-3 years.   Drug use: No   Sexual activity: Not Currently  Other Topics Concern   Not on file  Social History Narrative   Not on file   Social Determinants of Health   Financial Resource Strain: Low Risk    Difficulty of Paying  Living Expenses: Not hard at all  Food Insecurity: No Food Insecurity   Worried About Running Out of Food in the Last Year: Never true   [place] in the Last Year: Never true  Transportation Needs: No Transportation Needs   Lack of Transportation (Medical): No   Lack of Transportation (Non-Medical): No  Physical Activity: Inactive   Days of Exercise per Week: 0 days   Minutes of Exercise per Session: 0 min  Stress: Stress Concern Present   Feeling of Stress : Very much  Social Connections: Not on file    Tobacco Counseling Counseling given: Not Answered   Clinical Intake:  Pre-visit preparation completed: Yes  Pain : 0-10 Pain Score: 5  Pain Type: Chronic pain Pain Location: Back Pain Orientation: Lower Pain Radiating Towards: down left leg Pain Descriptors / Indicators: Aching,  Constant Pain Onset: More than a month ago Pain Frequency: Constant     Nutritional Status: BMI > 30  Obese Nutritional Risks: Nausea/ vomitting/ diarrhea (nauseous yesterday, has resolved) Diabetes: Yes  How often do you need to have someone help you when you read instructions, pamphlets, or other written materials from your doctor or pharmacy?: 1 - Never What is the last grade level you completed in school?: some college  Diabetic? Yes Nutrition Risk Assessment:  Has the patient had any N/V/D within the last 2 months?  Yes  Does the patient have any non-healing wounds?  No  Has the patient had any unintentional weight loss or weight gain?  No   Diabetes:  Is the patient diabetic?  Yes  If diabetic, was a CBG obtained today?  No  Did the patient bring in their glucometer from home?  No  How often do you monitor your CBG's? Does not.   Financial Strains and Diabetes Management:  Are you having any financial strains with the device, your supplies or your medication? No .  Does the patient want to be seen by Chronic Care Management for management of their diabetes?  No  Would  the patient like to be referred to a Nutritionist or for Diabetic Management?  No   Diabetic Exams:  Diabetic Eye Exam: Overdue for diabetic eye exam. Pt has been advised about the importance in completing this exam. Patient advised to call and schedule an eye exam. Diabetic Foot Exam: Completed 06/29/2020   Interpreter Needed?: No  Information entered by :: NAllen LPN   Activities of Daily Living In your present state of health, do you have any difficulty performing the following activities: 01/20/2021  Hearing? Y  Vision? N  Difficulty concentrating or making decisions? N  Walking or climbing stairs? Y  Dressing or bathing? N  Doing errands, shopping? N  Preparing Food and eating ? N  Using the Toilet? N  In the past six months, have you accidently leaked urine? Y  Do you have problems with loss of bowel control? Y  Comment occasional  Managing your Medications? N  Managing your Finances? N  Housekeeping or managing your Housekeeping? N  Some recent data might be hidden    Patient Care Team: Charlynne Cousins, MD as PCP - General (Internal Medicine) Magnus Sinning, MD as Referring Physician (Nephrology) Emmaline Kluver., MD (Rheumatology)  Indicate any recent Medical Services you may have received from other than [facility] providers in the past year (date may be approximate).     Assessment:   This is a routine wellness examination for Nastasha.  Hearing/Vision screen Vision Screening - Comments:: No regular eye exams, Dr. Ellin Mayhew  Dietary issues and exercise activities discussed: Current Exercise Habits: The patient does not participate in regular exercise at present   Goals Addressed             This Visit's Progress    Patient Stated       01/20/2021, keep going       Depression Screen PHQ 2/9 Scores 01/20/2021 10/18/2020 06/29/2020 03/29/2020 01/15/2020 01/07/2020 07/01/2019  PHQ - 2 Score 0 2 0 2 1 0 2  PHQ- 9 Score - 3 0 7 5 5 12     Fall Risk Fall Risk   01/20/2021 10/27/2020 10/18/2020 06/29/2020 01/15/2020  Falls in the past year? 0 0 0 0 1  Comment - - - - lost balance, fall out of bed  Number falls in past yr: -  0 0 - 1  Comment - - - - -  Injury with Fall? - - 0 - 0  Risk for fall due to : Medication side effect No Fall Risks No Fall Risks - Medication side effect;Impaired balance/gait  Follow up Falls evaluation completed;Education provided;Falls prevention discussed Falls evaluation completed Falls evaluation completed - Falls evaluation completed;Education provided;Falls prevention discussed    FALL RISK PREVENTION PERTAINING TO THE HOME:  Any stairs in or around the home? Yes  If so, are there any without handrails? No  Home free of loose throw rugs in walkways, pet beds, electrical cords, etc? Yes  Adequate lighting in your home to reduce risk of falls? Yes   ASSISTIVE DEVICES UTILIZED TO PREVENT FALLS:  Life alert? No  Use of a cane, walker or w/c? No  Grab bars in the bathroom? Yes  Shower chair or bench in shower? Yes  Elevated toilet seat or a handicapped toilet? Yes   TIMED UP AND GO:  Was the test performed? No .      Cognitive Function:     6CIT Screen 01/20/2021 01/15/2020 12/12/2016  What Year? 0 points 0 points 0 points  What month? 0 points 0 points 0 points  What time? 0 points 0 points 0 points  Count back from 20 0 points 0 points 0 points  Months in reverse 0 points 0 points 0 points  Repeat phrase 4 points 0 points 0 points  Total Score 4 0 0    Immunizations Immunization History  Administered Date(s) Administered   Fluad Quad(high Dose 65+) 03/29/2020   Influenza, High Dose Seasonal PF 03/08/2017, 03/20/2018, 02/03/2019, 02/04/2019   Influenza-Unspecified 02/12/2014, 03/22/2015   PFIZER(Purple Top)SARS-COV-2 Vaccination 08/05/2019, 08/26/2019   Pneumococcal Conjugate-13 04/26/2015   Pneumococcal Polysaccharide-23 08/01/2016   Pneumococcal-Unspecified 07/19/2005, 02/19/2007   Tdap 09/23/2013    Zoster, Live 08/06/2011    TDAP status: Up to date  Flu Vaccine status: Due, Education has been provided regarding the importance of this vaccine. Advised may receive this vaccine at local pharmacy or Health Dept. Aware to provide a copy of the vaccination record if obtained from local pharmacy or Health Dept. Verbalized acceptance and understanding.  Pneumococcal vaccine status: Up to date  Covid-19 vaccine status: Completed vaccines  Qualifies for Shingles Vaccine? Yes   Zostavax completed Yes   Shingrix Completed?: No.    Education has been provided regarding the importance of this vaccine. Patient has been advised to call insurance company to determine out of pocket expense if they have not yet received this vaccine. Advised may also receive vaccine at local pharmacy or Health Dept. Verbalized acceptance and understanding.  Screening Tests Health Maintenance  Topic Date Due   Zoster Vaccines- Shingrix (1 of 2) Never done   COVID-19 Vaccine (3 - Pfizer risk series) 09/23/2019   OPHTHALMOLOGY EXAM  09/02/2020   INFLUENZA VACCINE  12/12/2020   COLONOSCOPY (Pts 45-50yrs Insurance coverage will need to be confirmed)  06/29/2021 (Originally 02/21/2020)   MAMMOGRAM  02/10/2021   HEMOGLOBIN A1C  04/13/2021   FOOT EXAM  06/29/2021   TETANUS/TDAP  09/24/2023   DEXA SCAN  Completed   Hepatitis C Screening  Completed   PNA vac Low Risk Adult  Completed   HPV VACCINES  Aged Out    Health Maintenance  Health Maintenance Due  Topic Date Due   Zoster Vaccines- Shingrix (1 of 2) Never done   COVID-19 Vaccine (3 - Pfizer risk series) 09/23/2019   OPHTHALMOLOGY EXAM  09/02/2020   INFLUENZA VACCINE  12/12/2020    Colorectal cancer screening: declines at time  Mammogram status: patient to schedule  Bone Density status: Completed 11/09/2010.   Lung Cancer Screening: (Low Dose CT Chest recommended if Age 54-80 years, 30 pack-year currently smoking OR have quit w/in 15years.) does not  qualify.   Lung Cancer Screening Referral: no  Additional Screening:  Hepatitis C Screening: does qualify; Completed 11/01/2015  Vision Screening: Recommended annual ophthalmology exams for early detection of glaucoma and other disorders of the eye. Is the patient up to date with their annual eye exam?  No  Who is the provider or what is the name of the office in which the patient attends annual eye exams? Dr. Ellin Mayhew If pt is not established with a provider, would they like to be referred to a provider to establish care? No .   Dental Screening: Recommended annual dental exams for proper oral hygiene  Community Resource Referral / Chronic Care Management: CRR required this visit?  No   CCM required this visit?  No      Plan:     I have personally reviewed and noted the following in the patient's chart:   Medical and social history Use of alcohol, tobacco or illicit drugs  Current medications and supplements including opioid prescriptions.  Functional ability and status Nutritional status Physical activity Advanced directives List of other physicians Hospitalizations, surgeries, and ER visits in previous 12 months Vitals Screenings to include cognitive, depression, and falls Referrals and appointments  In addition, I have reviewed and discussed with patient certain preventive protocols, quality metrics, and best practice recommendations. A written personalized care plan for preventive services as well as general preventive health recommendations were provided to patient.     Kellie Simmering, LPN   0/07/2120   Nurse Notes:

## 2021-01-20 NOTE — Patient Instructions (Signed)
Katie Singh , Thank you for taking time to come for your Medicare Wellness Visit. I appreciate your ongoing commitment to your health goals. Please review the following plan we discussed and let me know if I can assist you in the future.   Screening recommendations/referrals: Colonoscopy: declines at this time Mammogram: patient to schedule Bone Density: completed 11/09/2010 Recommended yearly ophthalmology/optometry visit for glaucoma screening and checkup Recommended yearly dental visit for hygiene and checkup  Vaccinations: Influenza vaccine: due Pneumococcal vaccine: completed 08/01/2016 Tdap vaccine: completed 09/23/2013, due 09/24/2023 Shingles vaccine: discussed   Covid-19: 08/26/2019, 08/05/2019  Advanced directives: Advance directive discussed with you today.   Conditions/risks identified: none  Next appointment: Follow up in one year for your annual wellness visit    Preventive Care 65 Years and Older, Female Preventive care refers to lifestyle choices and visits with your health care provider that can promote health and wellness. What does preventive care include? A yearly physical exam. This is also called an annual well check. Dental exams once or twice a year. Routine eye exams. Ask your health care provider how often you should have your eyes checked. Personal lifestyle choices, including: Daily care of your teeth and gums. Regular physical activity. Eating a healthy diet. Avoiding tobacco and drug use. Limiting alcohol use. Practicing safe sex. Taking low-dose aspirin every day. Taking vitamin and mineral supplements as recommended by your health care provider. What happens during an annual well check? The services and screenings done by your health care provider during your annual well check will depend on your age, overall health, lifestyle risk factors, and family history of disease. Counseling  Your health care provider may ask you questions about your: Alcohol  use. Tobacco use. Drug use. Emotional well-being. Home and relationship well-being. Sexual activity. Eating habits. History of falls. Memory and ability to understand (cognition). Work and work Statistician. Reproductive health. Screening  You may have the following tests or measurements: Height, weight, and BMI. Blood pressure. Lipid and cholesterol levels. These may be checked every 5 years, or more frequently if you are over 11 years old. Skin check. Lung cancer screening. You may have this screening every year starting at age 28 if you have a 30-pack-year history of smoking and currently smoke or have quit within the past 15 years. Fecal occult blood test (FOBT) of the stool. You may have this test every year starting at age 54. Flexible sigmoidoscopy or colonoscopy. You may have a sigmoidoscopy every 5 years or a colonoscopy every 10 years starting at age 26. Hepatitis C blood test. Hepatitis B blood test. Sexually transmitted disease (STD) testing. Diabetes screening. This is done by checking your blood sugar (glucose) after you have not eaten for a while (fasting). You may have this done every 1-3 years. Bone density scan. This is done to screen for osteoporosis. You may have this done starting at age 48. Mammogram. This may be done every 1-2 years. Talk to your health care provider about how often you should have regular mammograms. Talk with your health care provider about your test results, treatment options, and if necessary, the need for more tests. Vaccines  Your health care provider may recommend certain vaccines, such as: Influenza vaccine. This is recommended every year. Tetanus, diphtheria, and acellular pertussis (Tdap, Td) vaccine. You may need a Td booster every 10 years. Zoster vaccine. You may need this after age 32. Pneumococcal 13-valent conjugate (PCV13) vaccine. One dose is recommended after age 53. Pneumococcal polysaccharide (PPSV23) vaccine. One  dose is  recommended after age 47. Talk to your health care provider about which screenings and vaccines you need and how often you need them. This information is not intended to replace advice given to you by your health care provider. Make sure you discuss any questions you have with your health care provider. Document Released: 05/27/2015 Document Revised: 01/18/2016 Document Reviewed: 03/01/2015 Elsevier Interactive Patient Education  2017 [place] Prevention in the Home Falls can cause injuries. They can happen to people of all ages. There are many things you can do to make your home safe and to help prevent falls. What can I do on the outside of my home? Regularly fix the edges of walkways and driveways and fix any cracks. Remove anything that might make you trip as you walk through a door, such as a raised step or threshold. Trim any bushes or trees on the path to your home. Use bright outdoor lighting. Clear any walking paths of anything that might make someone trip, such as rocks or tools. Regularly check to see if handrails are loose or broken. Make sure that both sides of any steps have handrails. Any raised decks and porches should have guardrails on the edges. Have any leaves, snow, or ice cleared regularly. Use sand or salt on walking paths during winter. Clean up any spills in your garage right away. This includes oil or grease spills. What can I do in the bathroom? Use night lights. Install grab bars by the toilet and in the tub and shower. Do not use towel bars as grab bars. Use non-skid mats or decals in the tub or shower. If you need to sit down in the shower, use a plastic, non-slip stool. Keep the floor dry. Clean up any water that spills on the floor as soon as it happens. Remove soap buildup in the tub or shower regularly. Attach bath mats securely with double-sided non-slip rug tape. Do not have throw rugs and other things on the floor that can make you  trip. What can I do in the bedroom? Use night lights. Make sure that you have a light by your bed that is easy to reach. Do not use any sheets or blankets that are too big for your bed. They should not hang down onto the floor. Have a firm chair that has side arms. You can use this for support while you get dressed. Do not have throw rugs and other things on the floor that can make you trip. What can I do in the kitchen? Clean up any spills right away. Avoid walking on wet floors. Keep items that you use a lot in easy-to-reach places. If you need to reach something above you, use a strong step stool that has a grab bar. Keep electrical cords out of the way. Do not use floor polish or wax that makes floors slippery. If you must use wax, use non-skid floor wax. Do not have throw rugs and other things on the floor that can make you trip. What can I do with my stairs? Do not leave any items on the stairs. Make sure that there are handrails on both sides of the stairs and use them. Fix handrails that are broken or loose. Make sure that handrails are as long as the stairways. Check any carpeting to make sure that it is firmly attached to the stairs. Fix any carpet that is loose or worn. Avoid having throw rugs at the top or bottom of the  stairs. If you do have throw rugs, attach them to the floor with carpet tape. Make sure that you have a light switch at the top of the stairs and the bottom of the stairs. If you do not have them, ask someone to add them for you. What else can I do to help prevent falls? Wear shoes that: Do not have high heels. Have rubber bottoms. Are comfortable and fit you well. Are closed at the toe. Do not wear sandals. If you use a stepladder: Make sure that it is fully opened. Do not climb a closed stepladder. Make sure that both sides of the stepladder are locked into place. Ask someone to hold it for you, if possible. Clearly mark and make sure that you can  see: Any grab bars or handrails. First and last steps. Where the edge of each step is. Use tools that help you move around (mobility aids) if they are needed. These include: Canes. Walkers. Scooters. Crutches. Turn on the lights when you go into a dark area. Replace any light bulbs as soon as they burn out. Set up your furniture so you have a clear path. Avoid moving your furniture around. If any of your floors are uneven, fix them. If there are any pets around you, be aware of where they are. Review your medicines with your doctor. Some medicines can make you feel dizzy. This can increase your chance of falling. Ask your doctor what other things that you can do to help prevent falls. This information is not intended to replace advice given to you by your health care provider. Make sure you discuss any questions you have with your health care provider. Document Released: 02/24/2009 Document Revised: 10/06/2015 Document Reviewed: 06/04/2014 Elsevier Interactive Patient Education  2017 [employer].

## 2021-01-30 ENCOUNTER — Ambulatory Visit (INDEPENDENT_AMBULATORY_CARE_PROVIDER_SITE_OTHER): Payer: Medicare Other

## 2021-01-30 DIAGNOSIS — Z23 Encounter for immunization: Secondary | ICD-10-CM | POA: Diagnosis not present

## 2021-02-07 ENCOUNTER — Other Ambulatory Visit: Payer: Self-pay | Admitting: Internal Medicine

## 2021-02-07 NOTE — Telephone Encounter (Signed)
Requested medication (s) are due for refill today: yes  Requested medication (s) are on the active medication list: yes  Last refill:  10/18/20  Future visit scheduled: no  Notes to clinic:  overdue lab work   Requested Prescriptions  Pending Prescriptions Disp Refills   levothyroxine (SYNTHROID) 25 MCG tablet [Pharmacy Med Name: LEVOTHYROXINE 25 MCG TABLET] 90 tablet 1    Sig: TAKE 1 [place]     Endocrinology:  Hypothyroid Agents Failed - 02/07/2021  1:30 AM      Failed - TSH needs to be rechecked within 3 months after an abnormal result. Refill until TSH is due.      Failed - TSH in normal range and within 360 days    TSH  Date Value Ref Range Status  10/12/2020 10.200 (H) 0.450 - 4.500 uIU/mL Final          Passed - Valid encounter within last 12 months    Recent Outpatient Visits           3 months ago [place] Vigg, Avanti, MD   3 months ago COVID-47   [employer], Customer service manager, MD   3 months ago Hypothyroidism, unspecified type   [facility] Vigg, Avanti, MD   4 months ago Heart rate fast   [place], MD   7 months ago Type 2 diabetes mellitus with stage 4 chronic kidney disease, without long-term current use of insulin ([place])   [place], Barbaraann Faster, NP       Future Appointments             In 61 months [employer], [facility]

## 2021-02-07 NOTE — Telephone Encounter (Signed)
Patient last seen 10/18/20

## 2021-02-20 ENCOUNTER — Ambulatory Visit: Payer: Self-pay

## 2021-02-20 NOTE — Telephone Encounter (Signed)
The patient shares that yesterday they experienced a migraine on the right side of their head that last nearly two hours around 3:00 PM yesterday 02/19/21   The patient would like to be prescribed medication to help and shares that they've never experienced this much discomfort   The patient shares that they've experienced a significant amount of stress and would like to discuss this further when possible    Patient returned call and reports no symptoms now. C/o stress due to caregiver for husband and became frustrated on Friday attempting to get medication for patient. C/o migraines x 2 since Saturday 02/18/21. C/o migraine last night  that woke her up and that was "worst headache of life' and reports feeling right arm pain N/T , blurred vision and reports she has symptoms when she gets migraines. B/p 139/90. Patient reports she is out of ultram and valium . During stressful time on Friday was unable to decrease stressed due to running out of medication. Instructed patient if any symptoms return weakness on one side , N/T on either side, blurred vision, speech difficulty, call 911. Appt scheduled for 02/21/21. Care advise given. Patient verbalized understanding of care advise and to go to ED or call 911 if symptoms worsen.  Reason for Disposition  Headache  Answer Assessment - Initial Assessment Questions 1. LOCATION: "Where does it hurt?"      Right side of head center to back and top of head  2. ONSET: "When did the headache start?" (Minutes, hours or days)      Saturday 02/18/21 3. PATTERN: "Does the pain come and go, or has it been constant since it started?"     Comes and goes  4. SEVERITY: "How bad is the pain?" and "What does it keep you from doing?"  (e.g., Scale 1-10; mild, moderate, or severe)   - MILD (1-3): doesn't interfere with normal activities    - MODERATE (4-7): interferes with normal activities or awakens from sleep    - SEVERE (8-10): excruciating pain, unable to do any normal  activities        Severe. "Worst headache of life"  5. RECURRENT SYMPTOM: "Have you ever had headaches before?" If Yes, ask: "When was the last time?" and "What happened that time?"      Yes but not this bad 6. CAUSE: "What do you think is causing the headache?"     Increased stress due to caregiver for husband .  7. MIGRAINE: "Have you been diagnosed with migraine headaches?" If Yes, ask: "Is this headache similar?"      Yes , worse. 8. HEAD INJURY: "Has there been any recent injury to the head?"      na 9. OTHER SYMPTOMS: "Do you have any other symptoms?" (fever, stiff neck, eye pain, sore throat, cold symptoms)     No symptoms now , right arm pain, N/T.  10. PREGNANCY: "Is there any chance you are pregnant?" "When was your last menstrual period?"       na  Protocols used: [facility]

## 2021-02-21 ENCOUNTER — Encounter: Payer: Self-pay | Admitting: Internal Medicine

## 2021-02-21 ENCOUNTER — Telehealth (INDEPENDENT_AMBULATORY_CARE_PROVIDER_SITE_OTHER): Payer: Medicare Other | Admitting: Internal Medicine

## 2021-02-21 DIAGNOSIS — G43709 Chronic migraine without aura, not intractable, without status migrainosus: Secondary | ICD-10-CM | POA: Insufficient documentation

## 2021-02-21 DIAGNOSIS — R7989 Other specified abnormal findings of blood chemistry: Secondary | ICD-10-CM | POA: Diagnosis not present

## 2021-02-21 DIAGNOSIS — I10 Essential (primary) hypertension: Secondary | ICD-10-CM | POA: Diagnosis not present

## 2021-02-21 MED ORDER — SUMATRIPTAN SUCCINATE 25 MG PO TABS: 25.0000 mg | ORAL_TABLET | ORAL | 2 refills | Status: DC | PRN

## 2021-02-21 NOTE — Progress Notes (Signed)
There were no vitals taken for this visit.   Subjective:    Patient ID: Katie Singh, female    DOB: 07-03-1950, 70 y.o.   MRN: [id number]  Chief Complaint  Patient presents with   Migraine    For the past 3 days.     HPI: Katie Singh is a 70 y.o. female   This visit was completed via video visit through MyChart due to the restrictions of the COVID-19 pandemic. All issues as above were discussed and addressed. Physical exam was done as above through visual confirmation on video through MyChart. If it was felt that the patient should be evaluated in the office, they were directed there. The patient verbally consented to this visit. Location of the patient: home Location of the provider: home Those involved with this call:  Provider: Charlynne Cousins, MD CMA: Frazier Butt, [place] Desk/Registration: Myrlene Broker  Time spent on call: 10 minutes with patient face to face via video conference. More than 50% of this time was spent in counseling and coordination of care. 10 minutes total spent in review of patient's record and preparation of their chart.    Migraine  This is a chronic (has had them for years was on diltiazem for such , recurrent ons aturday night, no fevr or chills, no sinus drainage took allegra) problem. Episode onset: bp was 139/90 mm hg. The problem has been gradually improving. The pain is located in the Bilateral region. The pain is at a severity of 0/10 (no pain right now but was 9 / 10 when she had the headache yesterday). The patient is experiencing no pain. Pertinent negatives include no coughing, dizziness, fever, numbness or weakness.   Chief Complaint  Patient presents with   Migraine    For the past 3 days.     Relevant past medical, surgical, family and social history reviewed and updated as indicated. Interim medical history since our last visit reviewed. Allergies and medications reviewed and updated.  Review of Systems  Constitutional:   Negative for activity change, appetite change, chills, fatigue and fever.  HENT:  Positive for postnasal drip. Negative for congestion.   Eyes:  Negative for visual disturbance.  Respiratory:  Negative for apnea, cough, chest tightness, shortness of breath and wheezing.   Cardiovascular:  Negative for chest pain, palpitations and leg swelling.  Endocrine: Negative for cold intolerance, heat intolerance, polydipsia, polyphagia and polyuria.  Neurological:  Positive for headaches. Negative for dizziness, speech difficulty, weakness, light-headedness and numbness.   Per HPI unless specifically indicated above     Objective:    There were no vitals taken for this visit.  Wt Readings from Last 3 Encounters:  03/07/21 250 lb 6 oz (113.6 kg)  01/20/21 260 lb (117.9 kg)  09/27/20 275 lb 6.4 oz (124.9 kg)    Physical Exam  Results for orders placed or performed in visit on 10/12/20  Urine Culture   Specimen: Urine   UR  Result Value Ref Range   Urine Culture, Routine Final report (A)    Organism ID, Bacteria Klebsiella pneumoniae (A)    Antimicrobial Susceptibility Comment   Microscopic Examination   Urine  Result Value Ref Range   WBC, UA 0-5 0 - 5 /hpf   RBC 0-2 0 - 2 /hpf   Epithelial Cells (non renal) 0-10 0 - 10 /hpf   Casts Present (A) None seen /lpf   Cast Type Hyaline casts N/A   Mucus, UA Present (A) Not  Estab.   Bacteria, UA Many (A) None seen/Few  Microalbumin, Urine Waived  Result Value Ref Range   Microalb, Ur Waived 30 (H) 0 - 19 mg/L   Creatinine, Urine Waived 300 10 - 300 mg/dL   Microalb/Creat Ratio <30 <30 mg/g  Lipid panel  Result Value Ref Range   Cholesterol, Total 145 100 - 199 mg/dL   Triglycerides 129 0 - 149 mg/dL   HDL 35 (L) >39 mg/dL   VLDL Cholesterol Cal 23 5 - 40 mg/dL   LDL Chol Calc (NIH) 87 0 - 99 mg/dL   Chol/HDL Ratio 4.1 0.0 - 4.4 ratio  CBC with Differential/Platelet  Result Value Ref Range   WBC 5.6 3.4 - 10.8 x10E3/uL   RBC 4.64  3.77 - 5.28 x10E6/uL   Hemoglobin 12.8 11.1 - 15.9 g/dL   Hematocrit 39.2 34.0 - 46.6 %   MCV 85 79 - 97 fL   MCH 27.6 26.6 - 33.0 pg   MCHC 32.7 31.5 - 35.7 g/dL   RDW 14.1 11.7 - 15.4 %   Platelets 285 150 - 450 x10E3/uL   Neutrophils 58 Not Estab. %   Lymphs 29 Not Estab. %   Monocytes 8 Not Estab. %   Eos 4 Not Estab. %   Basos 1 Not Estab. %   Neutrophils Absolute 3.3 1.4 - 7.0 x10E3/uL   Lymphocytes Absolute 1.6 0.7 - 3.1 x10E3/uL   Monocytes Absolute 0.5 0.1 - 0.9 x10E3/uL   EOS (ABSOLUTE) 0.2 0.0 - 0.4 x10E3/uL   Basophils Absolute 0.1 0.0 - 0.2 x10E3/uL   Immature Granulocytes 0 Not Estab. %   Immature Grans (Abs) 0.0 0.0 - 0.1 x10E3/uL  Thyroid Panel With TSH  Result Value Ref Range   TSH 10.200 (H) 0.450 - 4.500 uIU/mL   T4, Total 7.5 4.5 - 12.0 ug/dL   T3 Uptake Ratio 24 24 - 39 %   Free Thyroxine Index 1.8 1.2 - 4.9  Bayer DCA Hb A1c Waived  Result Value Ref Range   HB A1C (BAYER DCA - WAIVED) 6.9 <7.0 %  Comprehensive metabolic panel  Result Value Ref Range   Glucose 148 (H) 65 - 99 mg/dL   BUN 26 8 - 27 mg/dL   Creatinine, Ser 2.01 (H) 0.57 - 1.00 mg/dL   eGFR 26 (L) >59 mL/min/1.73   BUN/Creatinine Ratio 13 12 - 28   Sodium 140 134 - 144 mmol/L   Potassium 4.0 3.5 - 5.2 mmol/L   Chloride 101 96 - 106 mmol/L   CO2 22 20 - 29 mmol/L   Calcium 9.1 8.7 - 10.3 mg/dL   Total Protein 7.1 6.0 - 8.5 g/dL   Albumin 4.2 3.8 - 4.8 g/dL   Globulin, Total 2.9 1.5 - 4.5 g/dL   Albumin/Globulin Ratio 1.4 1.2 - 2.2   Bilirubin Total 0.5 0.0 - 1.2 mg/dL   Alkaline Phosphatase 170 (H) 44 - 121 IU/L   AST 14 0 - 40 IU/L   ALT 14 0 - 32 IU/L  Urinalysis, Routine w reflex microscopic  Result Value Ref Range   Specific Gravity, UA 1.020 1.005 - 1.030   pH, UA 5.0 5.0 - 7.5   Color, UA Yellow Yellow   Appearance Ur Cloudy (A) Clear   Leukocytes,UA Trace (A) Negative   Protein,UA Negative Negative/Trace   Glucose, UA Negative Negative   Ketones, UA Negative Negative    RBC, UA Negative Negative   Bilirubin, UA Negative Negative   Urobilinogen, Ur 0.2 0.2 -  1.0 mg/dL   Nitrite, UA Negative Negative   Microscopic Examination See below:         Current Outpatient Medications:    albuterol (VENTOLIN HFA) 108 (90 Base) MCG/ACT inhaler, TAKE 2 PUFFS BY MOUTH EVERY 6 HOURS AS NEEDED FOR WHEEZE OR SHORTNESS OF BREATH, Disp: 1 each, Rfl: 2   aspirin 325 MG tablet, Take by mouth., Disp: , Rfl:    diazepam (VALIUM) 5 MG tablet, Take 1 tablet (5 mg total) by mouth daily as needed for anxiety., Disp: 20 tablet, Rfl: 0   diltiazem (CARDIZEM CD) 180 MG 24 hr capsule, TAKE 1 CAPSULE BY MOUTH EVERY DAY, Disp: 90 capsule, Rfl: 1   fluticasone (FLONASE) 50 MCG/ACT nasal spray, SPRAY 2 SPRAYS INTO EACH NOSTRIL EVERY DAY, Disp: 16 g, Rfl: 4   furosemide (LASIX) 40 MG tablet, Take 1 tablet (40 mg total) by mouth daily., Disp: 90 tablet, Rfl: 4   losartan (COZAAR) 25 MG tablet, Take 1 tablet (25 mg total) by mouth daily., Disp: 90 tablet, Rfl: 4   methocarbamol (ROBAXIN) 750 MG tablet, TAKE 1/2 TO 1 TABLET BY MOUTH TWICE A DAY AS NEEDED, Disp: , Rfl:    ondansetron (ZOFRAN-ODT) 8 MG disintegrating tablet, Take 1 tablet (8 mg total) by mouth every 8 (eight) hours as needed for nausea or vomiting., Disp: 20 tablet, Rfl: 0   PARoxetine (PAXIL) 40 MG tablet, TAKE 1 TABLET BY MOUTH EVERY DAY IN THE MORNING, Disp: 90 tablet, Rfl: 3   pregabalin (LYRICA) 25 MG capsule, TAKE 1 CAPSULE BY MOUTH 2 TIMES DAILY., Disp: 180 capsule, Rfl: 0   REPATHA 140 MG/ML SOSY, INJECT 140 MG INTO THE SKIN EVERY 14 (FOURTEEN) DAYS., Disp: 2.1 mL, Rfl: 1   sitaGLIPtin (JANUVIA) 25 MG tablet, Take 1 tablet (25 mg total) by mouth daily., Disp: 90 tablet, Rfl: 4   TIADYLT ER 180 MG 24 hr capsule, Take 180 mg by mouth daily., Disp: , Rfl:    tiZANidine (ZANAFLEX) 4 MG tablet, Take 1 tablet (4 mg total) by mouth every 6 (six) hours as needed for muscle spasms., Disp: 30 tablet, Rfl: 0   allopurinol  (ZYLOPRIM) 100 MG tablet, TAKE 1 TABLET BY MOUTH EVERY DAY, Disp: 90 tablet, Rfl: 1   hydrOXYzine (VISTARIL) 25 MG capsule, Take 1 capsule (25 mg total) by mouth every 8 (eight) hours as needed., Disp: 30 capsule, Rfl: 0   levothyroxine (SYNTHROID) 50 MCG tablet, Take 1 tablet (50 mcg total) by mouth daily before breakfast., Disp: 30 tablet, Rfl: 3   SUMAtriptan (IMITREX) 25 MG tablet, Take 1 tablet (25 mg total) by mouth every 2 (two) hours as needed for migraine. Not to exceed 100 mg in 24 hours. May repeat in 2 hours if headache persists or recurs.Take 1 tablet (25 mg total) by mouth every 2 (two) hours as needed for migraine. Not to exceed 100 mg in 24 hours. May repeat in 2 hours if headache persists or recurs., Disp: 20 tablet, Rfl: 2   traMADol (ULTRAM) 50 MG tablet, Take 1 tablet (50 mg total) by mouth every 12 (twelve) hours as needed for severe pain., Disp: 20 tablet, Rfl: 0    Assessment & Plan:  Migrianes : Will start pt on imitrex Take every2 hrs as needed and not to exceed 100 mg a day.  Consider prophylaxis for such   Problem List Items Addressed This Visit       Cardiovascular and Mediastinum   Hypertension   Relevant Orders  CBC with Differential/Platelet (Completed)   TSH   Comprehensive metabolic panel (Completed)   Lipid panel (Completed)   T4, free (Completed)   Urinalysis, Routine w reflex microscopic (Completed)   Chronic migraine without aura without status migrainosus, not intractable     Other   Elevated TSH - Primary   Relevant Orders   CBC with Differential/Platelet (Completed)   TSH   Comprehensive metabolic panel (Completed)   Lipid panel (Completed)   T4, free (Completed)   Urinalysis, Routine w reflex microscopic (Completed)     Orders Placed This Encounter  Procedures   CBC with Differential/Platelet   TSH   Comprehensive metabolic panel   Lipid panel   T4, free   Urinalysis, Routine w reflex microscopic     Meds ordered this encounter   Medications   DISCONTD: SUMAtriptan (IMITREX) 25 MG tablet    Sig: Take 1 tablet (25 mg total) by mouth every 2 (two) hours as needed for migraine. Not to exceed 100 mg in 24 hours. May repeat in 2 hours if headache persists or recurs.    Dispense:  20 tablet    Refill:  2     Follow up plan: Return in about 2 weeks (around 03/07/2021).

## 2021-02-22 ENCOUNTER — Other Ambulatory Visit: Payer: Self-pay | Admitting: Internal Medicine

## 2021-02-22 ENCOUNTER — Telehealth: Payer: Self-pay | Admitting: Internal Medicine

## 2021-02-22 DIAGNOSIS — F419 Anxiety disorder, unspecified: Secondary | ICD-10-CM

## 2021-02-22 DIAGNOSIS — M1990 Unspecified osteoarthritis, unspecified site: Secondary | ICD-10-CM

## 2021-02-22 NOTE — Telephone Encounter (Signed)
Requested medications are due for refill today.  yes  Requested medications are on the active medications list.  yes  Last refill. 10/18/2020  Future visit scheduled.   yes  Notes to clinic.  Labs are expired.

## 2021-02-22 NOTE — Telephone Encounter (Signed)
Requested medications are due for refill today.  yes  Requested medications are on the active medications list.  yes  Last refill. Tramadol 09/27/2020, Diazepam 06/29/2020  Future visit scheduled.   yes  Notes to clinic.  Medications are not delegated.

## 2021-02-22 NOTE — Telephone Encounter (Signed)
Medication: traMADol (ULTRAM) 50 MG tablet [[id number]]  diazepam (VALIUM) 5 MG tablet [[id number]]   Preferred pharmacy: [id number] - [place], [place] [address] Phone:  [phone]  Fax:  [phone]       SUMAtriptan (IMITREX) 25 MG tablet [[id number]] Pt called and stated the pharmacy did not receive medication. On refill the class states Print so I do not think pharmacy received.

## 2021-02-23 ENCOUNTER — Telehealth: Payer: Self-pay

## 2021-02-23 ENCOUNTER — Other Ambulatory Visit: Payer: Self-pay | Admitting: Internal Medicine

## 2021-02-23 MED ORDER — TRAMADOL HCL 50 MG PO TABS: 50.0000 mg | ORAL_TABLET | Freq: Two times a day (BID) | ORAL | 0 refills | Status: DC | PRN

## 2021-02-23 MED ORDER — HYDROXYZINE PAMOATE 25 MG PO CAPS: 25.0000 mg | ORAL_CAPSULE | Freq: Three times a day (TID) | ORAL | 0 refills | Status: DC | PRN

## 2021-02-23 MED ORDER — SUMATRIPTAN SUCCINATE 25 MG PO TABS: 25.0000 mg | ORAL_TABLET | ORAL | 2 refills | Status: DC | PRN

## 2021-02-23 NOTE — Telephone Encounter (Signed)
Pt states Dr Neomia Dear knows she is taking care of her husband.  She thinks if she had something to take for her nerves, she would not have so many migraines.  Advised pt that DR Vigg needs to see her in office for appt for these medications.  Pt verbalized understanding, however she still insist she wants these meds.  She states she has never had so much problem getting medication until now.

## 2021-02-23 NOTE — Telephone Encounter (Signed)
Spoke with [name], informed her that her prescriptions have been filled and are at pharmacy waiting on her.  She also stated that the home health that she is suppose to be getting not not there, the company stated that they did not have help to send to her to help with husband. She has been using [facility].  She also stated that her husband does not have a urologist but would like the condom caterers to help keep him from being wet all the time. She has been using 2 times a day.  As far as her asking for the refills for her husband, she stated that neurology told her that she would have to get the refills from her primary care provider.

## 2021-02-23 NOTE — Telephone Encounter (Signed)
Patient made aware medication has been sent to pharmacy and verbalized understanding.

## 2021-02-23 NOTE — Telephone Encounter (Signed)
Needs to be d/w her next visit I had asked that she fu with me in person for this. thnx

## 2021-02-23 NOTE — Telephone Encounter (Signed)
Noted. Will send in atarax for now has to be seen if she wants valium thnx.

## 2021-02-23 NOTE — Telephone Encounter (Signed)
Pt states she has called 3 X and was told the imitrex was sent to the pharmacy. So she keeps calling the pharmacy, who says they have not received yet.   However, this Rx states it was "printed".  No one is catching this.  Can you help?  SUMAtriptan (IMITREX) 25 MG tablet 20 tablet 2 02/21/2021    Sig - Route: Take 1 tablet (25 mg total) by mouth every 2 (two) hours as needed for migraine. Not to exceed 100 mg in 24 hours.  May repeat in 2 hours if headache persists or recurs. - Oral   Class: Print    [id number] - [place], [place] - [address]

## 2021-02-23 NOTE — Telephone Encounter (Signed)
Requested Prescriptions  Pending Prescriptions Disp Refills  . SUMAtriptan (IMITREX) 25 MG tablet 20 tablet 2    Sig: Take 1 tablet (25 mg total) by mouth every 2 (two) hours as needed for migraine. Not to exceed 100 mg in 24 hours. May repeat in 2 hours if headache persists or recurs.     Neurology:  Migraine Therapy - Triptan Passed - 02/23/2021  3:26 PM      Passed - Last BP in normal range    BP Readings from Last 1 Encounters:  09/27/20 112/78         Passed - Valid encounter within last 12 months    Recent Outpatient Visits          2 days ago Elevated TSH   [place] Vigg, Avanti, MD   3 months ago COVID-19   [facility] Vigg, Avanti, MD   4 months ago COVID-27   [facility] Vigg, Customer service manager, MD   4 months ago Hypothyroidism, unspecified type   [facility] Vigg, Avanti, MD   4 months ago Heart rate fast   [place], MD      Future Appointments            In 1 week Vigg, Avanti, MD [facility]   In 51 months  [employer], [place]

## 2021-02-24 NOTE — Telephone Encounter (Signed)
Noted, will refer pt to psych as need to decide what his needs are. Will d/ w Dr. Manuella Ghazi as well to figure out plan of care as pt is suffering dementia. Thnx.

## 2021-02-28 ENCOUNTER — Other Ambulatory Visit: Payer: Medicare Other

## 2021-02-28 ENCOUNTER — Other Ambulatory Visit: Payer: Self-pay

## 2021-02-28 DIAGNOSIS — I10 Essential (primary) hypertension: Secondary | ICD-10-CM | POA: Diagnosis not present

## 2021-02-28 DIAGNOSIS — E039 Hypothyroidism, unspecified: Secondary | ICD-10-CM

## 2021-02-28 DIAGNOSIS — R7989 Other specified abnormal findings of blood chemistry: Secondary | ICD-10-CM

## 2021-02-28 LAB — URINALYSIS, ROUTINE W REFLEX MICROSCOPIC
Bilirubin, UA: NEGATIVE
Glucose, UA: NEGATIVE
Ketones, UA: NEGATIVE
Nitrite, UA: NEGATIVE
Protein,UA: NEGATIVE
RBC, UA: NEGATIVE
Specific Gravity, UA: 1.01 (ref 1.005–1.030)
Urobilinogen, Ur: 0.2 mg/dL (ref 0.2–1.0)
pH, UA: 5 (ref 5.0–7.5)

## 2021-02-28 LAB — MICROSCOPIC EXAMINATION: RBC, Urine: NONE SEEN /hpf (ref 0–2)

## 2021-03-01 ENCOUNTER — Other Ambulatory Visit: Payer: Self-pay | Admitting: Internal Medicine

## 2021-03-01 ENCOUNTER — Telehealth: Payer: Self-pay

## 2021-03-01 LAB — LIPID PANEL
Chol/HDL Ratio: 4.1 ratio (ref 0.0–4.4)
Cholesterol, Total: 149 mg/dL (ref 100–199)
HDL: 36 mg/dL — ABNORMAL LOW (ref >39)
LDL Chol Calc (NIH): 88 mg/dL (ref 0–99)
Triglycerides: 138 mg/dL (ref 0–149)
VLDL Cholesterol Cal: 25 mg/dL (ref 5–40)

## 2021-03-01 LAB — THYROID PANEL WITH TSH
Free Thyroxine Index: 1.8 (ref 1.2–4.9)
T3 Uptake Ratio: 25 % (ref 24–39)
T4, Total: 7.3 ug/dL (ref 4.5–12.0)
TSH: 6.92 u[IU]/mL — ABNORMAL HIGH (ref 0.450–4.500)

## 2021-03-01 LAB — CBC WITH DIFFERENTIAL/PLATELET
Basophils Absolute: 0 10*3/uL (ref 0.0–0.2)
Basos: 1 %
EOS (ABSOLUTE): 0.2 10*3/uL (ref 0.0–0.4)
Eos: 5 %
Hematocrit: 40.5 % (ref 34.0–46.6)
Hemoglobin: 12.8 g/dL (ref 11.1–15.9)
Immature Grans (Abs): 0 10*3/uL (ref 0.0–0.1)
Immature Granulocytes: 0 %
Lymphocytes Absolute: 1.3 10*3/uL (ref 0.7–3.1)
Lymphs: 26 %
MCH: 27.2 pg (ref 26.6–33.0)
MCHC: 31.6 g/dL (ref 31.5–35.7)
MCV: 86 fL (ref 79–97)
Monocytes Absolute: 0.4 10*3/uL (ref 0.1–0.9)
Monocytes: 8 %
Neutrophils Absolute: 3.2 10*3/uL (ref 1.4–7.0)
Neutrophils: 60 %
Platelets: 283 10*3/uL (ref 150–450)
RBC: 4.71 x10E6/uL (ref 3.77–5.28)
RDW: 13.2 % (ref 11.7–15.4)
WBC: 5.2 10*3/uL (ref 3.4–10.8)

## 2021-03-01 LAB — COMPREHENSIVE METABOLIC PANEL
ALT: 9 IU/L (ref 0–32)
AST: 10 IU/L (ref 0–40)
Albumin/Globulin Ratio: 1.4 (ref 1.2–2.2)
Albumin: 4.4 g/dL (ref 3.8–4.8)
Alkaline Phosphatase: 147 IU/L — ABNORMAL HIGH (ref 44–121)
BUN/Creatinine Ratio: 13 (ref 12–28)
BUN: 22 mg/dL (ref 8–27)
Bilirubin Total: 0.4 mg/dL (ref 0.0–1.2)
CO2: 22 mmol/L (ref 20–29)
Calcium: 9.6 mg/dL (ref 8.7–10.3)
Chloride: 103 mmol/L (ref 96–106)
Creatinine, Ser: 1.75 mg/dL — ABNORMAL HIGH (ref 0.57–1.00)
Globulin, Total: 3.1 g/dL (ref 1.5–4.5)
Glucose: 99 mg/dL (ref 70–99)
Potassium: 3.6 mmol/L (ref 3.5–5.2)
Sodium: 141 mmol/L (ref 134–144)
Total Protein: 7.5 g/dL (ref 6.0–8.5)
eGFR: 31 mL/min/{1.73_m2} — ABNORMAL LOW (ref >59)

## 2021-03-01 LAB — T4, FREE: Free T4: 1.01 ng/dL (ref 0.82–1.77)

## 2021-03-01 MED ORDER — LEVOTHYROXINE SODIUM 50 MCG PO TABS: 50.0000 ug | ORAL_TABLET | Freq: Every day | ORAL | 3 refills | Status: DC

## 2021-03-01 NOTE — Progress Notes (Signed)
TSH not at goal we will need to increase Synthroid to 50 mcg every morning before breakfast.  Please let patient know thank you

## 2021-03-01 NOTE — Telephone Encounter (Signed)
Patient aware of thyroid and medication change.

## 2021-03-01 NOTE — Telephone Encounter (Signed)
-----   Message from Charlynne Cousins, MD sent at 03/01/2021 11:44 AM EDT ----- TSH not at goal we will need to increase Synthroid to 50 mcg every morning before breakfast.  Please let patient know thank you

## 2021-03-07 ENCOUNTER — Other Ambulatory Visit: Payer: Self-pay

## 2021-03-07 ENCOUNTER — Ambulatory Visit (INDEPENDENT_AMBULATORY_CARE_PROVIDER_SITE_OTHER): Payer: Medicare Other | Admitting: Internal Medicine

## 2021-03-07 ENCOUNTER — Encounter: Payer: Self-pay | Admitting: Internal Medicine

## 2021-03-07 VITALS — BP 124/58 | HR 96 | Temp 98.1°F | Wt 250.4 lb

## 2021-03-07 DIAGNOSIS — M1A9XX0 Chronic gout, unspecified, without tophus (tophi): Secondary | ICD-10-CM

## 2021-03-07 DIAGNOSIS — I152 Hypertension secondary to endocrine disorders: Secondary | ICD-10-CM

## 2021-03-07 DIAGNOSIS — E1159 Type 2 diabetes mellitus with other circulatory complications: Secondary | ICD-10-CM | POA: Diagnosis not present

## 2021-03-07 DIAGNOSIS — M1990 Unspecified osteoarthritis, unspecified site: Secondary | ICD-10-CM

## 2021-03-07 DIAGNOSIS — R011 Cardiac murmur, unspecified: Secondary | ICD-10-CM

## 2021-03-07 MED ORDER — HYDROXYZINE PAMOATE 25 MG PO CAPS: 25.0000 mg | ORAL_CAPSULE | Freq: Three times a day (TID) | ORAL | 3 refills | Status: DC | PRN

## 2021-03-07 MED ORDER — TRAMADOL HCL 50 MG PO TABS: 50.0000 mg | ORAL_TABLET | Freq: Two times a day (BID) | ORAL | 0 refills | Status: AC | PRN

## 2021-03-07 MED ORDER — LEVOTHYROXINE SODIUM 75 MCG PO TABS: 75.0000 ug | ORAL_TABLET | Freq: Every day | ORAL | 5 refills | Status: AC

## 2021-03-07 NOTE — Progress Notes (Signed)
BP (!) 124/58   Pulse 96   Temp 98.1 F (36.7 C) (Oral)   Wt 250 lb 6 oz (113.6 kg)   SpO2 98%   BMI 38.07 kg/m    Subjective:    Patient ID: Katie Singh, female    DOB: February 06, 1951, 70 y.o.   MRN: [id number]  Chief Complaint  Patient presents with   Follow-up    Blood work    HPI: Katie Singh is a 70 y.o. female  Migraine  This is a recurrent (ho such, stable, no more episodes since lastr virtual visit/ since placing on imitrex, didnt need to start this yet.) problem. The problem has been resolved. Pertinent negatives include no abdominal pain, coughing, dizziness, ear pain, eye pain, fever, nausea, numbness, vomiting or weakness.  Diabetes She presents for her follow-up (a1c at 6.9 only on januvia) diabetic visit. She has type 2 diabetes mellitus. Pertinent negatives for hypoglycemia include no confusion, dizziness, headaches, speech difficulty or tremors. Pertinent negatives for diabetes include no chest pain, no fatigue, no polydipsia, no polyphagia, no polyuria and no weakness.  Hyperlipidemia This is a chronic problem. The current episode started more than 1 year ago. The problem is controlled. Pertinent negatives include no chest pain, myalgias or shortness of breath.   Chief Complaint  Patient presents with   Follow-up    Blood work    Relevant past medical, surgical, family and social history reviewed and updated as indicated. Interim medical history since our last visit reviewed. Allergies and medications reviewed and updated.  Review of Systems  Constitutional:  Negative for activity change, appetite change, chills, fatigue and fever.  HENT:  Negative for congestion, ear discharge, ear pain and facial swelling.   Eyes:  Negative for pain and itching.  Respiratory:  Negative for cough, chest tightness, shortness of breath and wheezing.   Cardiovascular:  Negative for chest pain, palpitations and leg swelling.  Gastrointestinal:  Negative for abdominal  distention, abdominal pain, blood in stool, constipation, diarrhea, nausea and vomiting.  Endocrine: Negative for cold intolerance, heat intolerance, polydipsia, polyphagia and polyuria.  Genitourinary:  Negative for difficulty urinating, dysuria, flank pain, frequency, hematuria and urgency.  Musculoskeletal:  Negative for arthralgias, gait problem, joint swelling and myalgias.  Skin:  Negative for color change, rash and wound.  Neurological:  Negative for dizziness, tremors, speech difficulty, weakness, light-headedness, numbness and headaches.  Hematological:  Does not bruise/bleed easily.  Psychiatric/Behavioral:  Negative for agitation, confusion, decreased concentration, sleep disturbance and suicidal ideas.    Per HPI unless specifically indicated above     Objective:    BP (!) 124/58   Pulse 96   Temp 98.1 F (36.7 C) (Oral)   Wt 250 lb 6 oz (113.6 kg)   SpO2 98%   BMI 38.07 kg/m   Wt Readings from Last 3 Encounters:  03/07/21 250 lb 6 oz (113.6 kg)  01/20/21 260 lb (117.9 kg)  09/27/20 275 lb 6.4 oz (124.9 kg)    Physical Exam Vitals and nursing note reviewed.  Constitutional:      General: She is not in acute distress.    Appearance: Normal appearance. She is not ill-appearing or diaphoretic.  HENT:     Head: Normocephalic and atraumatic.     Right Ear: Tympanic membrane and external ear normal. There is no impacted cerumen.     Left Ear: External ear normal.     Nose: No congestion or rhinorrhea.     Mouth/Throat:  Pharynx: No oropharyngeal exudate or posterior oropharyngeal erythema.  Eyes:     Conjunctiva/sclera: Conjunctivae normal.     Pupils: Pupils are equal, round, and reactive to light.  Cardiovascular:     Rate and Rhythm: Normal rate and regular rhythm.     Heart sounds: No murmur heard.   No friction rub. No gallop.  Pulmonary:     Effort: No respiratory distress.     Breath sounds: No stridor. No wheezing or rhonchi.  Chest:     Chest wall:  No tenderness.  Abdominal:     General: Abdomen is flat. Bowel sounds are normal. There is no distension.     Palpations: Abdomen is soft. There is no mass.     Tenderness: There is no abdominal tenderness. There is no guarding.  Musculoskeletal:        General: No swelling or deformity.     Cervical back: Normal range of motion and neck supple. No rigidity or tenderness.     Right lower leg: No edema.     Left lower leg: No edema.  Skin:    General: Skin is warm and dry.     Coloration: Skin is not jaundiced.     Findings: No erythema.  Neurological:     Mental Status: She is alert and oriented to person, place, and time. Mental status is at baseline.    Results for orders placed or performed in visit on 02/28/21  Microscopic Examination   Urine  Result Value Ref Range   WBC, UA 0-5 0 - 5 /hpf   RBC None seen 0 - 2 /hpf   Epithelial Cells (non renal) 0-10 0 - 10 /hpf   Casts Present (A) None seen /lpf   Cast Type Hyaline casts N/A   Mucus, UA Present (A) Not Estab.   Bacteria, UA Many (A) None seen/Few  Thyroid Panel With TSH  Result Value Ref Range   TSH 6.920 (H) 0.450 - 4.500 uIU/mL   T4, Total 7.3 4.5 - 12.0 ug/dL   T3 Uptake Ratio 25 24 - 39 %   Free Thyroxine Index 1.8 1.2 - 4.9  CBC with Differential/Platelet  Result Value Ref Range   WBC 5.2 3.4 - 10.8 x10E3/uL   RBC 4.71 3.77 - 5.28 x10E6/uL   Hemoglobin 12.8 11.1 - 15.9 g/dL   Hematocrit 40.5 34.0 - 46.6 %   MCV 86 79 - 97 fL   MCH 27.2 26.6 - 33.0 pg   MCHC 31.6 31.5 - 35.7 g/dL   RDW 13.2 11.7 - 15.4 %   Platelets 283 150 - 450 x10E3/uL   Neutrophils 60 Not Estab. %   Lymphs 26 Not Estab. %   Monocytes 8 Not Estab. %   Eos 5 Not Estab. %   Basos 1 Not Estab. %   Neutrophils Absolute 3.2 1.4 - 7.0 x10E3/uL   Lymphocytes Absolute 1.3 0.7 - 3.1 x10E3/uL   Monocytes Absolute 0.4 0.1 - 0.9 x10E3/uL   EOS (ABSOLUTE) 0.2 0.0 - 0.4 x10E3/uL   Basophils Absolute 0.0 0.0 - 0.2 x10E3/uL   Immature  Granulocytes 0 Not Estab. %   Immature Grans (Abs) 0.0 0.0 - 0.1 x10E3/uL  Comprehensive metabolic panel  Result Value Ref Range   Glucose 99 70 - 99 mg/dL   BUN 22 8 - 27 mg/dL   Creatinine, Ser 1.75 (H) 0.57 - 1.00 mg/dL   eGFR 31 (L) >59 mL/min/1.73   BUN/Creatinine Ratio 13 12 - 28   Sodium  141 134 - 144 mmol/L   Potassium 3.6 3.5 - 5.2 mmol/L   Chloride 103 96 - 106 mmol/L   CO2 22 20 - 29 mmol/L   Calcium 9.6 8.7 - 10.3 mg/dL   Total Protein 7.5 6.0 - 8.5 g/dL   Albumin 4.4 3.8 - 4.8 g/dL   Globulin, Total 3.1 1.5 - 4.5 g/dL   Albumin/Globulin Ratio 1.4 1.2 - 2.2   Bilirubin Total 0.4 0.0 - 1.2 mg/dL   Alkaline Phosphatase 147 (H) 44 - 121 IU/L   AST 10 0 - 40 IU/L   ALT 9 0 - 32 IU/L  Lipid panel  Result Value Ref Range   Cholesterol, Total 149 100 - 199 mg/dL   Triglycerides 138 0 - 149 mg/dL   HDL 36 (L) >39 mg/dL   VLDL Cholesterol Cal 25 5 - 40 mg/dL   LDL Chol Calc (NIH) 88 0 - 99 mg/dL   Chol/HDL Ratio 4.1 0.0 - 4.4 ratio  T4, free  Result Value Ref Range   Free T4 1.01 0.82 - 1.77 ng/dL  Urinalysis, Routine w reflex microscopic  Result Value Ref Range   Specific Gravity, UA 1.010 1.005 - 1.030   pH, UA 5.0 5.0 - 7.5   Color, UA Yellow Yellow   Appearance Ur Cloudy (A) Clear   Leukocytes,UA Trace (A) Negative   Protein,UA Negative Negative/Trace   Glucose, UA Negative Negative   Ketones, UA Negative Negative   RBC, UA Negative Negative   Bilirubin, UA Negative Negative   Urobilinogen, Ur 0.2 0.2 - 1.0 mg/dL   Nitrite, UA Negative Negative   Microscopic Examination See below:         Current Outpatient Medications:    losartan (COZAAR) 25 MG tablet, Take 1 tablet (25 mg total) by mouth daily., Disp: 90 tablet, Rfl: 4   methocarbamol (ROBAXIN) 750 MG tablet, TAKE 1/2 TO 1 TABLET BY MOUTH TWICE A DAY AS NEEDED, Disp: , Rfl:    ondansetron (ZOFRAN-ODT) 8 MG disintegrating tablet, Take 1 tablet (8 mg total) by mouth every 8 (eight) hours as needed for  nausea or vomiting., Disp: 20 tablet, Rfl: 0   PARoxetine (PAXIL) 40 MG tablet, TAKE 1 TABLET BY MOUTH EVERY DAY IN THE MORNING, Disp: 90 tablet, Rfl: 3   pregabalin (LYRICA) 25 MG capsule, TAKE 1 CAPSULE BY MOUTH 2 TIMES DAILY., Disp: 180 capsule, Rfl: 0   sitaGLIPtin (JANUVIA) 25 MG tablet, Take 1 tablet (25 mg total) by mouth daily., Disp: 90 tablet, Rfl: 4   SUMAtriptan (IMITREX) 25 MG tablet, Take 1 tablet (25 mg total) by mouth every 2 (two) hours as needed for migraine. Not to exceed 100 mg in 24 hours. May repeat in 2 hours if headache persists or recurs.Take 1 tablet (25 mg total) by mouth every 2 (two) hours as needed for migraine. Not to exceed 100 mg in 24 hours. May repeat in 2 hours if headache persists or recurs., Disp: 20 tablet, Rfl: 2   TIADYLT ER 180 MG 24 hr capsule, Take 180 mg by mouth daily., Disp: , Rfl:    albuterol (VENTOLIN HFA) 108 (90 Base) MCG/ACT inhaler, TAKE 2 PUFFS BY MOUTH EVERY 6 HOURS AS NEEDED FOR WHEEZE OR SHORTNESS OF BREATH, Disp: 6.7 each, Rfl: 2   allopurinol (ZYLOPRIM) 100 MG tablet, TAKE 1 TABLET BY MOUTH EVERY DAY, Disp: 90 tablet, Rfl: 1   aspirin 325 MG tablet, Take by mouth., Disp: , Rfl:    diltiazem (CARDIZEM CD) 180  MG 24 hr capsule, TAKE 1 CAPSULE BY MOUTH EVERY DAY, Disp: 90 capsule, Rfl: 1   fluticasone (FLONASE) 50 MCG/ACT nasal spray, SPRAY 2 SPRAYS INTO EACH NOSTRIL EVERY DAY, Disp: 16 g, Rfl: 4   furosemide (LASIX) 40 MG tablet, Take 1 tablet (40 mg total) by mouth daily., Disp: 90 tablet, Rfl: 4   hydrOXYzine (VISTARIL) 25 MG capsule, Take 1 capsule (25 mg total) by mouth every 8 (eight) hours as needed., Disp: 30 capsule, Rfl: 3   levothyroxine (SYNTHROID) 75 MCG tablet, Take 1 tablet (75 mcg total) by mouth daily before breakfast., Disp: 30 tablet, Rfl: 5   traMADol (ULTRAM) 50 MG tablet, Take 1 tablet (50 mg total) by mouth every 12 (twelve) hours as needed for up to 7 days for severe pain., Disp: 20 tablet, Rfl: 0    Assessment & Plan:   Elevated Creatinine improving has been rehydrating more now. Fu with nephro in 3 months. Hasnt been seen a while  2. Hypothyroidism increase synthroid to 75 mcg a day,PLEASE TAKE YOUR THYROID MEDICATION FIRST THING IN THE MORNING WHILST FASTING.  NO MEDICATION/ FOOD FOR AN HOUR AFTER INGESTING THYROID PILLS.  3. Migraines is on imitrex Consider prophylaxix for such if recurs.   4. Dm a1c at 6.9 , is [place] for such  check HbA1c,  urine  microalbumin  diabetic diet plan given to pt  adviced regarding hypoglycemia and instructions given to pt today on how to prevent and treat the same if it were to occur. pt acknowledges the plan and voices understanding of the same.  exercise plan given and encouraged.   advice diabetic yearly podiatry, ophthalmology , nutritionist , dental check q 6 months,   5. gout is on allopurinol for such , not acutely inflammed  -check URIC acid levels today - Consider uloric for prevention of such    6. Htn is on cozaar for such  HTN :  Continue current meds.  Medication compliance emphasised. pt advised to keep Bp logs. Pt verbalised understanding of the same. Pt to have a low salt diet . Exercise to reach a goal of at least 150 mins a week.  lifestyle modifications explained and pt understands importance of the above.   7 Hld Results for Katie Singh, Katie Singh (MRN [id number]) as of 03/07/2021 15:32  Ref. Range 10/12/2020 08:23 10/12/2020 09:25 02/28/2021 08:48 02/28/2021 08:52  Total CHOL/HDL Ratio Latest Ref Range: 0.0 - 4.4 ratio 4.1   4.1  Cholesterol, Total Latest Ref Range: 100 - 199 mg/dL 145   149  HDL Cholesterol Latest Ref Range: >39 mg/dL 35 (L)   36 (L)  Triglycerides Latest Ref Range: 0 - 149 mg/dL 129   138  VLDL Cholesterol Cal Latest Ref Range: 5 - 40 mg/dL 23   25  LDL Chol Calc (NIH) Latest Ref Range: 0 - 99 mg/dL 87   88   Problem List Items Addressed This Visit       Cardiovascular and Mediastinum   Hypertension associated with diabetes (HCC)    Relevant Orders   Bayer DCA Hb A1c Waived (STAT)     Musculoskeletal and Integument   Osteoarthritis   Relevant Medications   traMADol (ULTRAM) 50 MG tablet     Other   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    Dispense:  30 tablet    Refill:  5   hydrOXYzine (VISTARIL) 25 MG capsule    Sig: Take 1 capsule (25 mg total) by mouth every 8 (eight) hours as needed.    Dispense:  30 capsule    Refill:  3   traMADol (ULTRAM) 50 MG tablet    Sig: Take 1 tablet (50 mg total) by mouth every 12 (twelve) hours as needed for up to 7 days for severe pain.    Dispense:  20 tablet    Refill:  0    Not to exceed 5 additional fills before 07/05/2020 DX Code Needed  .     Follow up plan: Return in about 4 weeks (around 04/04/2021).

## 2021-03-08 ENCOUNTER — Other Ambulatory Visit: Payer: Self-pay | Admitting: Nurse Practitioner

## 2021-03-09 DIAGNOSIS — E1159 Type 2 diabetes mellitus with other circulatory complications: Secondary | ICD-10-CM | POA: Diagnosis not present

## 2021-03-09 DIAGNOSIS — I10 Essential (primary) hypertension: Secondary | ICD-10-CM | POA: Diagnosis not present

## 2021-03-09 DIAGNOSIS — E782 Mixed hyperlipidemia: Secondary | ICD-10-CM | POA: Diagnosis not present

## 2021-03-09 DIAGNOSIS — I350 Nonrheumatic aortic (valve) stenosis: Secondary | ICD-10-CM | POA: Diagnosis not present

## 2021-03-09 NOTE — Telephone Encounter (Signed)
Requested Prescriptions  Pending Prescriptions Disp Refills  . albuterol (VENTOLIN HFA) 108 (90 Base) MCG/ACT inhaler [Pharmacy Med Name: ALBUTEROL HFA (PROVENTIL) INH] 6.7 each 2    Sig: TAKE 2 PUFFS BY MOUTH EVERY 6 HOURS AS NEEDED FOR WHEEZE OR SHORTNESS OF BREATH     Pulmonology:  Beta Agonists Failed - 03/08/2021  6:31 PM      Failed - One inhaler should last at least one month. If the patient is requesting refills earlier, contact the patient to check for uncontrolled symptoms.      Passed - Valid encounter within last 12 months    Recent Outpatient Visits          2 days ago Chronic gout without tophus, unspecified cause, unspecified site   [facility] Vigg, Avanti, MD   2 weeks ago Elevated TSH   [facility] Vigg, Avanti, MD   4 months ago COVID-19   [employer], Customer service manager, MD   4 months ago COVID-19   [employer], Customer service manager, MD   4 months ago Hypothyroidism, unspecified type   [place], [place]      Future Appointments            In 3 weeks Vigg, Avanti, MD [facility], [facility]   In 10 months  [employer], [place]

## 2021-03-14 ENCOUNTER — Encounter: Payer: Self-pay | Admitting: Internal Medicine

## 2021-03-14 DIAGNOSIS — M1A9XX0 Chronic gout, unspecified, without tophus (tophi): Secondary | ICD-10-CM | POA: Insufficient documentation

## 2021-03-14 DIAGNOSIS — I152 Hypertension secondary to endocrine disorders: Secondary | ICD-10-CM | POA: Insufficient documentation

## 2021-03-14 DIAGNOSIS — R011 Cardiac murmur, unspecified: Secondary | ICD-10-CM | POA: Insufficient documentation

## 2021-03-29 ENCOUNTER — Other Ambulatory Visit: Payer: Medicare Other

## 2021-04-05 ENCOUNTER — Ambulatory Visit: Payer: Medicare Other | Admitting: Internal Medicine

## 2021-05-19 ENCOUNTER — Ambulatory Visit: Payer: Self-pay

## 2021-05-19 NOTE — Telephone Encounter (Signed)
Routing to providers in office to advise.

## 2021-05-19 NOTE — Telephone Encounter (Signed)
°  Chief Complaint: UTI Symptoms: dysuria, headache, frequency Frequency: 1 week Pertinent Negatives: Patient denies fever Disposition: [] ED /[] Urgent Care (no appt availability in office) / [] Appointment(In office/virtual)/ []  [facility]/ [] Home Care/ [] Refused Recommended Disposition /[] [place] [facility]/ [x]  Follow-up with PCP Additional Notes: Attempted to schedule pt for appt but she says she watches her husband who has Alzheimer and its hard to get away. She asked if something can be sent into pharmacy. Advised pt I will send message over and if we need to reach out we can. Care advice given and pt verbalized understanding. No other questions/concerns noted.     Summary: Advice- possible bladder infection   Pt called in stating she thinks she has a bladder infection, has had burning during urination for a few days now and states she has a headache, pt stated normally she gets a headache when she has a bladder infection, pt wanted to know if something could be called in or what she should do.      Reason for Disposition  Urinating more frequently than usual (i.e., frequency)  Answer Assessment - Initial Assessment Questions 1. SYMPTOM: "What's the main symptom you're concerned about?" (e.g., frequency, incontinence)     Dysuria, 2. ONSET: "When did the  sx  start?"     Over a week ago  3. PAIN: "Is there any pain?" If Yes, ask: "How bad is it?" (Scale: 1-10; mild, moderate, severe)     8 5. OTHER SYMPTOMS: "Do you have any other symptoms?" (e.g., fever, flank pain, blood in urine, pain with urination)     Pain with urination. Headache,  Protocols used: Urinary Symptoms-A-AH

## 2021-05-19 NOTE — Telephone Encounter (Signed)
Tried to call pt to schedule an appt three times. I kpt getting a message that said there were network difficulties. Will try again Monday.

## 2021-05-19 NOTE — Telephone Encounter (Signed)
Needs appointment

## 2021-05-21 ENCOUNTER — Other Ambulatory Visit: Payer: Self-pay

## 2021-05-21 ENCOUNTER — Encounter: Payer: Self-pay | Admitting: Emergency Medicine

## 2021-05-21 ENCOUNTER — Emergency Department: Payer: Medicare Other

## 2021-05-21 ENCOUNTER — Emergency Department
Admission: EM | Admit: 2021-05-21 | Discharge: 2021-05-21 | Disposition: A | Discharge status: Home / Self Care | Payer: Medicare Other | Attending: Emergency Medicine | Admitting: Emergency Medicine

## 2021-05-21 DIAGNOSIS — G4489 Other headache syndrome: Secondary | ICD-10-CM | POA: Diagnosis not present

## 2021-05-21 DIAGNOSIS — G43909 Migraine, unspecified, not intractable, without status migrainosus: Secondary | ICD-10-CM | POA: Insufficient documentation

## 2021-05-21 DIAGNOSIS — Z7982 Long term (current) use of aspirin: Secondary | ICD-10-CM | POA: Insufficient documentation

## 2021-05-21 DIAGNOSIS — N184 Chronic kidney disease, stage 4 (severe): Secondary | ICD-10-CM | POA: Diagnosis not present

## 2021-05-21 DIAGNOSIS — G43019 Migraine without aura, intractable, without status migrainosus: Secondary | ICD-10-CM

## 2021-05-21 DIAGNOSIS — I129 Hypertensive chronic kidney disease with stage 1 through stage 4 chronic kidney disease, or unspecified chronic kidney disease: Secondary | ICD-10-CM | POA: Diagnosis not present

## 2021-05-21 DIAGNOSIS — Z7984 Long term (current) use of oral hypoglycemic drugs: Secondary | ICD-10-CM | POA: Diagnosis not present

## 2021-05-21 DIAGNOSIS — Z79899 Other long term (current) drug therapy: Secondary | ICD-10-CM | POA: Diagnosis not present

## 2021-05-21 DIAGNOSIS — R519 Headache, unspecified: Secondary | ICD-10-CM | POA: Diagnosis not present

## 2021-05-21 DIAGNOSIS — E1122 Type 2 diabetes mellitus with diabetic chronic kidney disease: Secondary | ICD-10-CM | POA: Insufficient documentation

## 2021-05-21 DIAGNOSIS — G43009 Migraine without aura, not intractable, without status migrainosus: Secondary | ICD-10-CM | POA: Diagnosis not present

## 2021-05-21 LAB — COMPREHENSIVE METABOLIC PANEL
ALT: 14 U/L (ref 0–44)
AST: 17 U/L (ref 15–41)
Albumin: 4.1 g/dL (ref 3.5–5.0)
Alkaline Phosphatase: 124 U/L (ref 38–126)
Anion gap: 9 (ref 5–15)
BUN: 19 mg/dL (ref 8–23)
CO2: 24 mmol/L (ref 22–32)
Calcium: 9.3 mg/dL (ref 8.9–10.3)
Chloride: 105 mmol/L (ref 98–111)
Creatinine, Ser: 1.5 mg/dL — ABNORMAL HIGH (ref 0.44–1.00)
GFR, Estimated: 37 mL/min — ABNORMAL LOW (ref >60)
Glucose, Bld: 169 mg/dL — ABNORMAL HIGH (ref 70–99)
Potassium: 4.1 mmol/L (ref 3.5–5.1)
Sodium: 138 mmol/L (ref 135–145)
Total Bilirubin: 0.8 mg/dL (ref 0.3–1.2)
Total Protein: 7.6 g/dL (ref 6.5–8.1)

## 2021-05-21 LAB — CBC WITH DIFFERENTIAL/PLATELET
Abs Immature Granulocytes: 0.02 10*3/uL (ref 0.00–0.07)
Basophils Absolute: 0.1 10*3/uL (ref 0.0–0.1)
Basophils Relative: 1 %
Eosinophils Absolute: 0.2 10*3/uL (ref 0.0–0.5)
Eosinophils Relative: 3 %
HCT: 41.7 % (ref 36.0–46.0)
Hemoglobin: 13.5 g/dL (ref 12.0–15.0)
Immature Granulocytes: 0 %
Lymphocytes Relative: 13 %
Lymphs Abs: 1 10*3/uL (ref 0.7–4.0)
MCH: 27.8 pg (ref 26.0–34.0)
MCHC: 32.4 g/dL (ref 30.0–36.0)
MCV: 86 fL (ref 80.0–100.0)
Monocytes Absolute: 0.4 10*3/uL (ref 0.1–1.0)
Monocytes Relative: 6 %
Neutro Abs: 5.6 10*3/uL (ref 1.7–7.7)
Neutrophils Relative %: 77 %
Platelets: 287 10*3/uL (ref 150–400)
RBC: 4.85 MIL/uL (ref 3.87–5.11)
RDW: 13 % (ref 11.5–15.5)
WBC: 7.2 10*3/uL (ref 4.0–10.5)
nRBC: 0 % (ref 0.0–0.2)

## 2021-05-21 LAB — LIPASE, BLOOD: Lipase: 45 U/L (ref 11–51)

## 2021-05-21 MED ORDER — SODIUM CHLORIDE 0.9 % IV BOLUS
500.0000 mL | Freq: Once | INTRAVENOUS | Status: AC
  Administered 2021-05-21: 500 mL via INTRAVENOUS

## 2021-05-21 MED ORDER — ONDANSETRON HCL 4 MG/2ML IJ SOLN
4.0000 mg | Freq: Once | INTRAMUSCULAR | Status: AC
  Administered 2021-05-21: 4 mg via INTRAVENOUS
  Filled 2021-05-21: qty 2

## 2021-05-21 MED ORDER — DIPHENHYDRAMINE HCL 50 MG/ML IJ SOLN
12.5000 mg | Freq: Once | INTRAMUSCULAR | Status: AC
Start: 2021-05-21 — End: 2021-05-21
  Administered 2021-05-21: 12.5 mg via INTRAVENOUS
  Filled 2021-05-21: qty 1

## 2021-05-21 MED ORDER — DEXAMETHASONE SODIUM PHOSPHATE 10 MG/ML IJ SOLN
10.0000 mg | Freq: Once | INTRAMUSCULAR | Status: AC
Start: 2021-05-21 — End: 2021-05-21
  Administered 2021-05-21: 10 mg via INTRAVENOUS
  Filled 2021-05-21: qty 1

## 2021-05-21 MED ORDER — BUTALBITAL-APAP-CAFFEINE 50-325-40 MG PO TABS
1.0000 | ORAL_TABLET | Freq: Four times a day (QID) | ORAL | 0 refills | Status: DC | PRN
Start: 2021-05-21 — End: 2021-06-14

## 2021-05-21 MED ORDER — SODIUM CHLORIDE 0.9 % IV SOLN
12.5000 mg | Freq: Once | INTRAVENOUS | Status: AC
  Administered 2021-05-21: 12.5 mg via INTRAVENOUS
  Filled 2021-05-21: qty 12.5

## 2021-05-21 MED ORDER — ONDANSETRON 4 MG PO TBDP
ORAL_TABLET | ORAL | Status: AC
  Filled 2021-05-21: qty 1

## 2021-05-21 MED ORDER — BUTALBITAL-APAP-CAFFEINE 50-325-40 MG PO TABS
1.0000 | ORAL_TABLET | Freq: Once | ORAL | Status: AC
  Administered 2021-05-21: 1 via ORAL
  Filled 2021-05-21: qty 1

## 2021-05-21 NOTE — ED Notes (Signed)
Pt c/o migraine for about a week. Pt has taken the medications she has at home without relief. Pt reports light sensitivity & N/V. Pt states this migraine is worse than her typical migraines.

## 2021-05-21 NOTE — ED Provider Notes (Signed)
[facility] Emergency Department Provider Note ____________________________________________  Time seen: 1900  I have reviewed the triage vital signs and the nursing notes.  HISTORY  Chief Complaint  Migraine   HPI Katie Singh is a 71 y.o. female presents to the ER today with complaint of a migraine.  She reports this started 1 week ago.  The migraine is located on the left side of her head.  She describes the pain as sharp and throbbing.  The pain does not radiate.  She reports associated lightheadedness, blurred vision, sensitivity to light and sound, nausea and vomiting.  She reports a history of migraines but reports the pain with this 1 is more intense than usual.  She denies any neck pain.  She has tried Imitrex as prescribed with minimal relief of symptoms.  She reports she thinks she also took a beta-blocker which she normally takes when she has a migraine however there are no beta-blockers on her medication list.  She reports her migraines are typically triggered by stress.  Past Medical History:  Diagnosis Date   Anemia    history of   Chronic kidney disease    stage 3-4   Complication of anesthesia    Diabetes mellitus without complication (HCC)    diet controlled   GERD (gastroesophageal reflux disease)    Hepatitis    fatty live   History of kidney stones    Hyperlipidemia    history of, no longer taking meds.   Hypertension    Klebsiella infection    Migraine headache with aura    history of   MSSA (methicillin susceptible Staphylococcus aureus)    Myelodysplasia    OA (osteoarthritis)    PONV (postoperative nausea and vomiting)    Positive anti-CCP test    followed by rheumatology   Rheumatic fever in pediatric patient 1961    Patient Active Problem List   Diagnosis Date Noted   Chronic gout without tophus 03/14/2021   Hypertension associated with diabetes ([place]) 03/14/2021   Heart murmur 03/14/2021   Chronic migraine without  aura without status migrainosus, not intractable 02/21/2021   Mild aortic valve stenosis 11/23/2020   Palpitations 10/05/2020   Sinus pressure 06/29/2020   Drug-induced myopathy 03/29/2020   Elevated TSH 01/07/2020   Dyspnea 12/17/2019   Long term prescription benzodiazepine use 07/01/2019   Morbid obesity ([place]) 07/01/2019   Inflammatory arthritis 04/19/2019   Anxiety 03/08/2017   Advanced care planning/counseling discussion 03/08/2017   CKD (chronic kidney disease) stage 4, GFR 15-29 ml/min (HCC) 05/02/2016   Osteoarthritis 12/14/2015   Major depression, chronic 12/14/2015   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 Authorizing Provider  albuterol (VENTOLIN HFA) 108 (90 Base) MCG/ACT inhaler TAKE 2 PUFFS BY MOUTH EVERY 6 HOURS AS NEEDED FOR WHEEZE OR SHORTNESS OF BREATH 03/09/21   Vigg, Avanti, MD  allopurinol (ZYLOPRIM) 100 MG  tablet TAKE 1 TABLET BY MOUTH EVERY DAY 02/23/21   Vigg, Avanti, MD  aspirin 325 MG tablet Take by mouth.    [provider]  diltiazem (CARDIZEM CD) 180 MG 24 hr capsule TAKE 1 CAPSULE BY MOUTH EVERY DAY 11/16/20   Vigg, Avanti, MD  fluticasone (FLONASE) 50 MCG/ACT nasal spray SPRAY 2 SPRAYS INTO EACH NOSTRIL EVERY DAY 07/30/20   Bacigalupo, Dionne Bucy, MD  furosemide (LASIX) 40 MG tablet Take 1 tablet (40 mg total) by mouth daily. 06/29/20   Cannady, Henrine Screws T, NP  hydrOXYzine (VISTARIL)  25 MG capsule Take 1 capsule (25 mg total) by mouth every 8 (eight) hours as needed. 03/07/21   Charlynne Cousins, MD  levothyroxine (SYNTHROID) 75 MCG tablet Take 1 tablet (75 mcg total) by mouth daily before breakfast. 03/07/21   Vigg, Avanti, MD  losartan (COZAAR) 25 MG tablet Take 1 tablet (25 mg total) by mouth daily. 06/29/20   Cannady, Henrine Screws T, NP  methocarbamol (ROBAXIN) 750 MG tablet TAKE 1/2 TO 1 TABLET BY MOUTH TWICE A DAY AS NEEDED 08/11/20   [provider]  ondansetron (ZOFRAN-ODT) 8 MG disintegrating tablet Take 1 tablet (8 mg total) by mouth every 8 (eight) hours as needed for nausea or vomiting. 10/27/20   Vigg, Avanti, MD  PARoxetine (PAXIL) 40 MG tablet TAKE 1 TABLET BY MOUTH EVERY DAY IN THE MORNING 09/10/20   Vigg, Avanti, MD  pregabalin (LYRICA) 25 MG capsule TAKE 1 CAPSULE BY MOUTH 2 TIMES DAILY. 01/06/21   Cannady, Henrine Screws T, NP  sitaGLIPtin (JANUVIA) 25 MG tablet Take 1 tablet (25 mg total) by mouth daily. 06/29/20   Cannady, Henrine Screws T, NP  SUMAtriptan (IMITREX) 25 MG tablet Take 1 tablet (25 mg total) by mouth every 2 (two) hours as needed for migraine. Not to exceed 100 mg in 24 hours. May repeat in 2 hours if headache persists or recurs.Take 1 tablet (25 mg total) by mouth every 2 (two) hours as needed for migraine. Not to exceed 100 mg in 24 hours. May repeat in 2 hours if headache persists or recurs. 02/23/21   Vigg, Avanti, MD  TIADYLT ER 180 MG 24 hr capsule Take 180 mg by mouth daily. 08/16/20   [provider]    Allergies Clarithromycin, Abilify [aripiprazole], Amoxicillin-pot clavulanate, Ciprofloxacin, Lisinopril, Lorcaserin, Macrodantin [nitrofurantoin macrocrystal], Olanzapine, Pantoprazole, Sulfa antibiotics, and Penicillins  Family History  Problem Relation Age of Onset   Cancer Father    Diabetes Mother    Heart disease Mother     Social History Social History   Tobacco Use   Smoking status: Never   Smokeless tobacco: Never  Vaping Use    Vaping Use: Never used  Substance Use Topics   Alcohol use: No    Comment:  once every 2-3 years.   Drug use: No    Review of Systems  Constitutional: Negative for fever, chills or body aches. Eyes: Positive for blurred vision. ENT: Negative for runny nose, nasal congestion, ear pain or sore throat. Cardiovascular: Negative for chest pain or chest tightness. Respiratory: Negative for cough or shortness of breath. Gastrointestinal: Positive for nausea and vomiting.  Negative for abdominal pain, or diarrhea. Musculoskeletal: Negative for neck or back pain. Skin: Negative for rash. Neurological: Positive for headache, lightheadedness.  Negative for focal weakness, tingling or numbness. ____________________________________________  PHYSICAL EXAM:  VITAL SIGNS: ED Triage Vitals  Enc Vitals Group     BP 05/21/21 1816 (!) 132/95     Pulse Rate 05/21/21  1816 78     Resp 05/21/21 1816 16     Temp 05/21/21 1816 97.9 F (36.6 C)     Temp Source 05/21/21 1816 Axillary     SpO2 05/21/21 1816 97 %     Weight 05/21/21 1823 300 lb (136.1 kg)     Height 05/21/21 1823 5\' 8"  (1.727 m)     Head Circumference --      Peak Flow --      Pain Score 05/21/21 1823 10     Pain Loc --      Pain Edu? --      Excl. in [place]? --     Constitutional: Alert and oriented.  Appears uncomfortable but in no distress. Head: Normocephalic. Eyes: PERRL. Normal extraocular movements Cardiovascular: Normal rate, regular rhythm.  Respiratory: Normal respiratory effort.  Gastrointestinal: Soft and nontender. Neurologic:  Normal speech and language. No gross focal neurologic deficits are appreciated. Skin:  Skin is warm, dry and intact. No rash noted.  ____________________________________________    LABS  Labs Reviewed  COMPREHENSIVE METABOLIC PANEL - Abnormal; Notable for the following components:      Result Value   Glucose, Bld 169 (*)    Creatinine, Ser 1.50 (*)    GFR, Estimated 37 (*)    All other  components within normal limits  CBC WITH DIFFERENTIAL/PLATELET  LIPASE, BLOOD  URINALYSIS, COMPLETE (UACMP) WITH MICROSCOPIC    ____________________________________________   RADIOLOGY Imaging Orders         CT Head Wo Contrast    IMPRESSION: No acute intracranial abnormality.   ____________________________________________   INITIAL IMPRESSION / ASSESSMENT AND PLAN / ED COURSE  Migraine, With Status Migrainosus:  CT head negative CBC, CMET, Lipase, Urinalysis shows mild bump in her creatinine but has known CKD 4 500 mL NS bolus Decadron 10 mg IV x 1 Zofran 4 mg IV x 1 Benadryl 12.5 mg IV x 1 Rechecked at [place], headache has improved slightly, remains nauseated Promethazine 12.5 mg IV x 1 Fioricet PO x 1 Handoff given to Betha Loa PA at 1950 ____________________________________________  FINAL CLINICAL IMPRESSION(S) / ED DIAGNOSES  Final diagnoses:  None      Jearld Fenton, NP 05/21/21 1951    Lavonia Drafts, MD 05/22/21 1134

## 2021-05-21 NOTE — ED Provider Triage Note (Signed)
Emergency Medicine Provider Triage Evaluation Note  Katie Singh , a 71 y.o. female  was evaluated in triage.  Pt complains of 10 out of 10 frontal headache for the past 5 days with nausea, vomiting and photophobia.  Patient has a history of migraines but this is atypical for her.  Review of Systems  Positive: Patient has headache. Negative: No chest pain, chest tightness or abdominal pain.  Physical Exam  There were no vitals taken for this visit. Gen:   Awake, no distress   Resp:  Normal effort  MSK:   Moves extremities without difficulty    Medical Decision Making  Medically screening exam initiated at 6:16 PM.  Appropriate orders placed.  Katie Singh was informed that the remainder of the evaluation will be completed by another provider, this initial triage assessment does not replace that evaluation, and the importance of remaining in the ED until their evaluation is complete.     Vallarie Mare [place], [place] 05/21/21 1817

## 2021-05-21 NOTE — ED Notes (Signed)
[place], light green, blue, red top tubes sent to lab. ?

## 2021-05-21 NOTE — ED Triage Notes (Signed)
Pt via EMS from home. Pt c/o headache and nausea, states that she has a hx of migraine, pain has been going on a week but got worse an  hour ago. Pt is A&OX4 and NAD.

## 2021-05-21 NOTE — ED Notes (Signed)
Secure msg sent to pharmacy requesting promethazine be tubed to FLEX ASAP.

## 2021-05-21 NOTE — ED Triage Notes (Signed)
Pt in via EMS from home with c/o HA. Pt with hx of migraines. Pt states she took some meds but was not able to tell EMS what kind of med she took.

## 2021-05-21 NOTE — ED Provider Notes (Signed)
----------------------------------------- °  8:51 PM on 05/21/2021 -----------------------------------------  Blood pressure (!) 132/95, pulse 78, temperature 97.9 F (36.6 C), temperature source Axillary, resp. rate 16, height 5\' 8"  (1.727 m), weight 136.1 kg, SpO2 97 %.  Assuming care from Webb Silversmith, NP.  In short, Katie Singh is a 71 y.o. female with a chief complaint of Migraine .  Refer to the original H&P for additional details.  The current plan of care is to await results of medications.  Patient presented with a migraine headache, was evaluated, have labs and imaging.  At this time patient was being given migraine cocktail and we are waiting to see if this provided good results for the patient.  Initially patient did not have much improvement but after another approximately hour and a half, patient has had improvement of her headache.  It is not fully alleviated at this time but definitely improving.  Symptoms should continue to improve with the medications overnight.  I will prescribe the patient a prescription for Fioricet for any return of headache.  Return precautions discussed with the patient and her daughter.   ED diagnosis:  Migraine headache.    Brynda Peon 05/21/21 2119    Merlyn Lot, MD 05/21/21 2213

## 2021-05-23 ENCOUNTER — Other Ambulatory Visit: Payer: Self-pay | Admitting: Internal Medicine

## 2021-05-23 NOTE — Telephone Encounter (Signed)
Copied from [place] [id number]. Topic: Quick Communication - Rx Refill/Question >> May 23, 2021 12:34 PM Leward Quan A wrote: Medication: sitaGLIPtin (JANUVIA) 25 MG tablet, pregabalin (LYRICA) 25 MG capsule   Has the patient contacted their pharmacy? No. Changing pharmacy  (Agent: If no, request that the patient contact the pharmacy for the refill. If patient does not wish to contact the pharmacy document the reason why and proceed with request.) (Agent: If yes, when and what did the pharmacy advise?)  Preferred Pharmacy (with phone number or street name): [place], [place] - [place]  Phone:  [phone] Fax:  [phone]    Has the patient been seen for an appointment in the last year OR does the patient have an upcoming appointment? Yes.    Agent: Please be advised that RX refills may take up to 3 business days. We ask that you follow-up with your pharmacy.

## 2021-05-23 NOTE — Telephone Encounter (Signed)
Requested medication (s) are due for refill today - no  Requested medication (s) are on the active medication list -yes  Future visit scheduled -tomorrow  Last refill: sitagliptin- 06/29/20 #90 4RF                 Pregabalin-01/06/21 #180  Notes to clinic: Request RF: sitagliptin- should have RF- appointment tomorrow, pregabalin- non delegated Rx- not sure if want to hold RF for tomorrow visit  Requested Prescriptions  Pending Prescriptions Disp Refills   sitaGLIPtin (JANUVIA) 25 MG tablet 90 tablet 4    Sig: Take 1 tablet (25 mg total) by mouth daily.     Endocrinology:  Diabetes - DPP-4 Inhibitors Failed - 05/23/2021  1:26 PM      Failed - HBA1C is between 0 and 7.9 and within 180 days    HB A1C (BAYER DCA - WAIVED)  Date Value Ref Range Status  10/12/2020 6.9 <7.0 % Final    Comment:                                          Diabetic Adult            <7.0                                       Healthy Adult        4.3 - 5.7                                                           (DCCT/NGSP) American Diabetes Association's Summary of Glycemic Recommendations for Adults with Diabetes: Hemoglobin A1c <7.0%. More stringent glycemic goals (A1c <6.0%) may further reduce complications at the cost of increased risk of hypoglycemia.           Failed - Cr in normal range and within 360 days    Creatinine  Date Value Ref Range Status  07/03/2012 2.82 (H) 0.60 - 1.30 mg/dL Final   Creatinine, Ser  Date Value Ref Range Status  05/21/2021 1.50 (H) 0.44 - 1.00 mg/dL Final          Passed - Valid encounter within last 6 months    Recent Outpatient Visits           2 months ago Chronic gout without tophus, unspecified cause, unspecified site   [facility] Vigg, Avanti, MD   3 months ago Elevated TSH   [place] Vigg, Avanti, MD   6 months ago COVID-18   [employer] [facility], Customer service manager, MD   7 months ago COVID-66   [employer],  Customer service manager, MD   7 months ago Hypothyroidism, unspecified type   [place], Avanti, MD       Future Appointments             Tomorrow Vigg, Avanti, MD [facility]   In 8 months  [facility]             pregabalin (LYRICA) 25 MG capsule 180 capsule 0    Sig: Take 1 capsule (25 mg total)  by mouth 2 (two) times daily.     Not Delegated - Neurology:  Anticonvulsants - Controlled Failed - 05/23/2021  1:26 PM      Failed - This refill cannot be delegated      Passed - Valid encounter within last 12 months    Recent Outpatient Visits           2 months ago Chronic gout without tophus, unspecified cause, unspecified site   [facility] Vigg, Avanti, MD   3 months ago Elevated TSH   [facility] Vigg, Avanti, MD   6 months ago COVID-31   [employer], Customer service manager, MD   7 months ago COVID-10   [employer], Customer service manager, MD   7 months ago Hypothyroidism, unspecified type   [place] Vigg, Avanti, MD       Future Appointments             Tomorrow Vigg, Avanti, MD [facility]   In 8 months  [facility]               Requested Prescriptions  Pending Prescriptions Disp Refills   sitaGLIPtin (JANUVIA) 25 MG tablet 90 tablet 4    Sig: Take 1 tablet (25 mg total) by mouth daily.     Endocrinology:  Diabetes - DPP-4 Inhibitors Failed - 05/23/2021  1:26 PM      Failed - HBA1C is between 0 and 7.9 and within 180 days    HB A1C (BAYER DCA - WAIVED)  Date Value Ref Range Status  10/12/2020 6.9 <7.0 % Final    Comment:                                          Diabetic Adult            <7.0                                       Healthy Adult        4.3 - 5.7                                                           (DCCT/NGSP) American Diabetes Association's Summary of Glycemic Recommendations for Adults with Diabetes:  Hemoglobin A1c <7.0%. More stringent glycemic goals (A1c <6.0%) may further reduce complications at the cost of increased risk of hypoglycemia.           Failed - Cr in normal range and within 360 days    Creatinine  Date Value Ref Range Status  07/03/2012 2.82 (H) 0.60 - 1.30 mg/dL Final   Creatinine, Ser  Date Value Ref Range Status  05/21/2021 1.50 (H) 0.44 - 1.00 mg/dL Final          Passed - Valid encounter within last 6 months    Recent Outpatient Visits           2 months ago Chronic gout without tophus, unspecified cause, unspecified site   [place], [place]   3 months ago Elevated TSH   [facility] Vigg, Avanti, MD   6 months ago COVID-69   [facility] Vigg, Customer service manager, MD   7 months ago COVID-92   [facility] Vigg, Customer service manager, MD   7 months ago Hypothyroidism, unspecified type   [facility] Vigg, Avanti, MD       Future Appointments             Tomorrow Vigg, Avanti, MD [place]   In 8 months  [place], [facility]             pregabalin (LYRICA) 25 MG capsule 180 capsule 0    Sig: Take 1 capsule (25 mg total) by mouth 2 (two) times daily.     Not Delegated - Neurology:  Anticonvulsants - Controlled Failed - 05/23/2021  1:26 PM      Failed - This refill cannot be delegated      Passed - Valid encounter within last 12 months    Recent Outpatient Visits           2 months ago Chronic gout without tophus, unspecified cause, unspecified site   [facility] Vigg, Avanti, MD   3 months ago Elevated TSH   [facility] Vigg, Avanti, MD   6 months ago COVID-19   [employer], Avanti, MD   7 months ago COVID-19   [employer], Customer service manager, MD   7 months ago Hypothyroidism, unspecified type   [facility] Vigg, Avanti, MD       Future Appointments             Tomorrow Vigg, Avanti, MD  [place], [place]   In 8 months  [facility], [place]

## 2021-05-24 ENCOUNTER — Ambulatory Visit (INDEPENDENT_AMBULATORY_CARE_PROVIDER_SITE_OTHER): Payer: Medicare Other | Admitting: Internal Medicine

## 2021-05-24 ENCOUNTER — Encounter: Payer: Self-pay | Admitting: Internal Medicine

## 2021-05-24 ENCOUNTER — Other Ambulatory Visit: Payer: Self-pay

## 2021-05-24 VITALS — BP 118/62 | HR 81 | Temp 97.9°F | Ht 68.11 in | Wt 252.0 lb

## 2021-05-24 DIAGNOSIS — E039 Hypothyroidism, unspecified: Secondary | ICD-10-CM | POA: Diagnosis not present

## 2021-05-24 DIAGNOSIS — R309 Painful micturition, unspecified: Secondary | ICD-10-CM | POA: Diagnosis not present

## 2021-05-24 DIAGNOSIS — E119 Type 2 diabetes mellitus without complications: Secondary | ICD-10-CM

## 2021-05-24 DIAGNOSIS — I10 Essential (primary) hypertension: Secondary | ICD-10-CM | POA: Diagnosis not present

## 2021-05-24 LAB — URINALYSIS, ROUTINE W REFLEX MICROSCOPIC
Bilirubin, UA: NEGATIVE
Glucose, UA: NEGATIVE
Ketones, UA: NEGATIVE
Leukocytes,UA: NEGATIVE
Nitrite, UA: NEGATIVE
Protein,UA: NEGATIVE
RBC, UA: NEGATIVE
Specific Gravity, UA: 1.025 (ref 1.005–1.030)
Urobilinogen, Ur: 0.2 mg/dL (ref 0.2–1.0)
pH, UA: 5 (ref 5.0–7.5)

## 2021-05-24 MED ORDER — SITAGLIPTIN PHOSPHATE 25 MG PO TABS: 25.0000 mg | ORAL_TABLET | Freq: Every day | ORAL | 4 refills | Status: AC

## 2021-05-24 MED ORDER — TOPIRAMATE 25 MG PO TABS: 25.0000 mg | ORAL_TABLET | Freq: Every day | ORAL | 2 refills | Status: DC

## 2021-05-24 MED ORDER — NURTEC 75 MG PO TBDP: 75.0000 mg | ORAL_TABLET | ORAL | 1 refills | Status: AC | PRN

## 2021-05-24 MED ORDER — PREGABALIN 75 MG PO CAPS: 75.0000 mg | ORAL_CAPSULE | Freq: Every day | ORAL | 2 refills | Status: DC

## 2021-05-24 MED ORDER — HYDROXYZINE PAMOATE 50 MG PO CAPS: 50.0000 mg | ORAL_CAPSULE | Freq: Three times a day (TID) | ORAL | 1 refills | Status: DC | PRN

## 2021-05-24 NOTE — Progress Notes (Signed)
There were no vitals taken for this visit.   Subjective:    Patient ID: Katie Singh, female    DOB: [birth year], 71 y.o.   MRN: [id number]  No chief complaint on file.   HPI: Katie Singh is a 71 y.o. female  Migraine  This is a recurrent (is on imitrex didnt help her this time. she took it x 2) problem. Pertinent negatives include no abdominal pain, coughing, dizziness, ear pain, eye pain, fever, nausea, numbness, vomiting or weakness. Her past medical history is significant for hypertension.  Diabetes She presents for her follow-up diabetic visit. She has type 2 diabetes mellitus. Pertinent negatives for hypoglycemia include no confusion, dizziness, headaches, nervousness/anxiousness, speech difficulty or tremors. Pertinent negatives for diabetes include no chest pain, no fatigue, no polydipsia, no polyphagia, no polyuria and no weakness.  Hypertension Pertinent negatives include no chest pain, headaches, palpitations or shortness of breath. Identifiable causes of hypertension include a thyroid problem.  Thyroid Problem Presents for follow-up visit. Patient reports no anxiety, cold intolerance, constipation, diarrhea, fatigue, heat intolerance, hoarse voice, leg swelling, palpitations or tremors.   No chief complaint on file.   Relevant past medical, surgical, family and social history reviewed and updated as indicated. Interim medical history since our last visit reviewed. Allergies and medications reviewed and updated.  Review of Systems  Constitutional:  Negative for activity change, appetite change, chills, fatigue and fever.  HENT:  Negative for congestion, ear discharge, ear pain, facial swelling and hoarse voice.   Eyes:  Negative for pain and itching.  Respiratory:  Negative for cough, chest tightness, shortness of breath and wheezing.   Cardiovascular:  Negative for chest pain, palpitations and leg swelling.  Gastrointestinal:  Negative for abdominal distention,  abdominal pain, blood in stool, constipation, diarrhea, nausea and vomiting.  Endocrine: Negative for cold intolerance, heat intolerance, polydipsia, polyphagia and polyuria.  Genitourinary:  Negative for difficulty urinating, dysuria, flank pain, frequency, hematuria and urgency.  Musculoskeletal:  Negative for arthralgias, gait problem, joint swelling and myalgias.  Skin:  Negative for color change, rash and wound.  Neurological:  Negative for dizziness, tremors, speech difficulty, weakness, light-headedness, numbness and headaches.  Hematological:  Does not bruise/bleed easily.  Psychiatric/Behavioral:  Negative for agitation, confusion, decreased concentration, sleep disturbance and suicidal ideas. The patient is not nervous/anxious.    Per HPI unless specifically indicated above     Objective:    There were no vitals taken for this visit.  Wt Readings from Last 3 Encounters:  05/21/21 300 lb (136.1 kg)  03/07/21 250 lb 6 oz (113.6 kg)  01/20/21 260 lb (117.9 kg)    Physical Exam Vitals and nursing note reviewed.  Constitutional:      General: She is not in acute distress.    Appearance: Normal appearance. She is obese. She is not ill-appearing or diaphoretic.  Eyes:     Conjunctiva/sclera: Conjunctivae normal.  Pulmonary:     Breath sounds: No rhonchi.  Abdominal:     General: Abdomen is flat. Bowel sounds are normal. There is no distension.     Palpations: Abdomen is soft. There is no mass.     Tenderness: There is no abdominal tenderness. There is no guarding.  Musculoskeletal:        General: Swelling and tenderness present. No signs of injury.     Right lower leg: No edema.     Left lower leg: No edema.  Skin:    General: Skin is warm and dry.  Coloration: Skin is not jaundiced or pale.     Findings: No erythema or rash.  Neurological:     Mental Status: She is alert.  Psychiatric:        Mood and Affect: Mood normal.        Behavior: Behavior normal.     Results for orders placed or performed during the hospital encounter of 05/21/21  CBC with Differential  Result Value Ref Range   WBC 7.2 4.0 - 10.5 K/uL   RBC 4.85 3.87 - 5.11 MIL/uL   Hemoglobin 13.5 12.0 - 15.0 g/dL   HCT 41.7 36.0 - 46.0 %   MCV 86.0 80.0 - 100.0 fL   MCH 27.8 26.0 - 34.0 pg   MCHC 32.4 30.0 - 36.0 g/dL   RDW 13.0 11.5 - 15.5 %   Platelets 287 150 - 400 K/uL   nRBC 0.0 0.0 - 0.2 %   Neutrophils Relative % 77 %   Neutro Abs 5.6 1.7 - 7.7 K/uL   Lymphocytes Relative 13 %   Lymphs Abs 1.0 0.7 - 4.0 K/uL   Monocytes Relative 6 %   Monocytes Absolute 0.4 0.1 - 1.0 K/uL   Eosinophils Relative 3 %   Eosinophils Absolute 0.2 0.0 - 0.5 K/uL   Basophils Relative 1 %   Basophils Absolute 0.1 0.0 - 0.1 K/uL   Immature Granulocytes 0 %   Abs Immature Granulocytes 0.02 0.00 - 0.07 K/uL  Comprehensive metabolic panel  Result Value Ref Range   Sodium 138 135 - 145 mmol/L   Potassium 4.1 3.5 - 5.1 mmol/L   Chloride 105 98 - 111 mmol/L   CO2 24 22 - 32 mmol/L   Glucose, Bld 169 (H) 70 - 99 mg/dL   BUN 19 8 - 23 mg/dL   Creatinine, Ser 1.50 (H) 0.44 - 1.00 mg/dL   Calcium 9.3 8.9 - 10.3 mg/dL   Total Protein 7.6 6.5 - 8.1 g/dL   Albumin 4.1 3.5 - 5.0 g/dL   AST 17 15 - 41 U/L   ALT 14 0 - 44 U/L   Alkaline Phosphatase 124 38 - 126 U/L   Total Bilirubin 0.8 0.3 - 1.2 mg/dL   GFR, Estimated 37 (L) >60 mL/min   Anion gap 9 5 - 15  Lipase, blood  Result Value Ref Range   Lipase 45 11 - 51 U/L        Current Outpatient Medications:    albuterol (VENTOLIN HFA) 108 (90 Base) MCG/ACT inhaler, TAKE 2 PUFFS BY MOUTH EVERY 6 HOURS AS NEEDED FOR WHEEZE OR SHORTNESS OF BREATH, Disp: 6.7 each, Rfl: 2   allopurinol (ZYLOPRIM) 100 MG tablet, TAKE 1 TABLET BY MOUTH EVERY DAY, Disp: 90 tablet, Rfl: 1   aspirin 325 MG tablet, Take by mouth., Disp: , Rfl:    butalbital-acetaminophen-caffeine (FIORICET) 50-325-40 MG tablet, Take 1 tablet by mouth every 6 (six) hours as needed  for headache., Disp: 20 tablet, Rfl: 0   diltiazem (CARDIZEM CD) 180 MG 24 hr capsule, TAKE 1 CAPSULE BY MOUTH EVERY DAY, Disp: 90 capsule, Rfl: 1   fluticasone (FLONASE) 50 MCG/ACT nasal spray, SPRAY 2 SPRAYS INTO EACH NOSTRIL EVERY DAY, Disp: 16 g, Rfl: 4   furosemide (LASIX) 40 MG tablet, Take 1 tablet (40 mg total) by mouth daily., Disp: 90 tablet, Rfl: 4   hydrOXYzine (VISTARIL) 25 MG capsule, Take 1 capsule (25 mg total) by mouth every 8 (eight) hours as needed., Disp: 30 capsule, Rfl: 3  levothyroxine (SYNTHROID) 75 MCG tablet, Take 1 tablet (75 mcg total) by mouth daily before breakfast., Disp: 30 tablet, Rfl: 5   losartan (COZAAR) 25 MG tablet, Take 1 tablet (25 mg total) by mouth daily., Disp: 90 tablet, Rfl: 4   methocarbamol (ROBAXIN) 750 MG tablet, TAKE 1/2 TO 1 TABLET BY MOUTH TWICE A DAY AS NEEDED, Disp: , Rfl:    ondansetron (ZOFRAN-ODT) 8 MG disintegrating tablet, Take 1 tablet (8 mg total) by mouth every 8 (eight) hours as needed for nausea or vomiting., Disp: 20 tablet, Rfl: 0   PARoxetine (PAXIL) 40 MG tablet, TAKE 1 TABLET BY MOUTH EVERY DAY IN THE MORNING, Disp: 90 tablet, Rfl: 3   pregabalin (LYRICA) 25 MG capsule, TAKE 1 CAPSULE BY MOUTH 2 TIMES DAILY., Disp: 180 capsule, Rfl: 0   sitaGLIPtin (JANUVIA) 25 MG tablet, Take 1 tablet (25 mg total) by mouth daily., Disp: 90 tablet, Rfl: 4   SUMAtriptan (IMITREX) 25 MG tablet, Take 1 tablet (25 mg total) by mouth every 2 (two) hours as needed for migraine. Not to exceed 100 mg in 24 hours. May repeat in 2 hours if headache persists or recurs.Take 1 tablet (25 mg total) by mouth every 2 (two) hours as needed for migraine. Not to exceed 100 mg in 24 hours. May repeat in 2 hours if headache persists or recurs., Disp: 20 tablet, Rfl: 2   TIADYLT ER 180 MG 24 hr capsule, Take 180 mg by mouth daily., Disp: , Rfl:     Assessment & Plan:  Migraines  Failed imitrex  Had to go out to the ER - was placed on fioricet  However requests  something else preventative  Will add topamax failed tiadylt for prevention Will rtc in 4 weeks for a fu on the above.   2. Htn  HTN : is on losartan 25 mg daily.  Continue current meds.  Medication compliance emphasised. pt advised to keep Bp logs. Pt verbalised understanding of the same. Pt to have a low salt diet . Exercise to reach a goal of at least 150 mins a week.  lifestyle modifications explained and pt understands importance of the above.   3. Hypothyroidism  Is on 75 mcg of synthroid. Chronic, ongoing with recent TSH within normal range.  Continue current medication regimen and adjust as needed.  Plan to recheck TSH at physical in 6 months.   4. Dm no recnet labs. check HbA1c,  urine  microalbumin  diabetic diet plan given to pt  adviced regarding hypoglycemia and instructions given to pt today on how to prevent and treat the same if it were to occur. pt acknowledges the plan and voices understanding of the same.  exercise plan given and encouraged.   advice diabetic yearly podiatry, ophthalmology , nutritionist , dental check q 6 months,  Problem List Items Addressed This Visit   None Visit Diagnoses     Painful urination    -  Primary   Relevant Orders   Urinalysis, Routine w reflex microscopic   Urine Culture        Orders Placed This Encounter  Procedures   Urine Culture   Urinalysis, Routine w reflex microscopic     No orders of the defined types were placed in this encounter.    Follow up plan: No follow-ups on file.

## 2021-05-25 ENCOUNTER — Other Ambulatory Visit: Payer: Self-pay | Admitting: Internal Medicine

## 2021-05-25 NOTE — Telephone Encounter (Signed)
Medication Refill - Medication: allopurinol (ZYLOPRIM) 100 MG tablet  Has the patient contacted their pharmacy? Yes.    (Agent: If yes, when and what did the pharmacy advise?) Pcp didn't call it in  Preferred Pharmacy (with phone number or street name):  [place], [place] - [address]  Phone:  [phone] Fax:  [phone]    Has the patient been seen for an appointment in the last year OR does the patient have an upcoming appointment? Yes.    Agent: Please be advised that RX refills may take up to 3 business days. We ask that you follow-up with your pharmacy.

## 2021-05-25 NOTE — Telephone Encounter (Signed)
Requested medication (s) are due for refill today: yes  Requested medication (s) are on the active medication list: yes  Last refill:  02/23/21 #90/1  Future visit scheduled: yes  Notes to clinic:  Unable to refill per protocol due to failed labs, no updated results.      Requested Prescriptions  Pending Prescriptions Disp Refills   allopurinol (ZYLOPRIM) 100 MG tablet 90 tablet 1    Sig: Take 1 tablet (100 mg total) by mouth daily.     Endocrinology:  Gout Agents Failed - 05/25/2021  4:04 PM      Failed - Uric Acid in normal range and within 360 days    Uric Acid  Date Value Ref Range Status  12/14/2015 7.5 (H) 2.5 - 7.1 mg/dL Final    Comment:               Therapeutic target for gout patients: <6.0          Failed - Cr in normal range and within 360 days    Creatinine  Date Value Ref Range Status  07/03/2012 2.82 (H) 0.60 - 1.30 mg/dL Final   Creatinine, Ser  Date Value Ref Range Status  05/21/2021 1.50 (H) 0.44 - 1.00 mg/dL Final          Passed - Valid encounter within last 12 months    Recent Outpatient Visits           Yesterday Painful urination   [facility] Vigg, Avanti, MD   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

## 2021-05-26 MED ORDER — CEPHALEXIN 500 MG PO CAPS: 500.0000 mg | ORAL_CAPSULE | Freq: Two times a day (BID) | ORAL | 0 refills | Status: AC

## 2021-05-26 MED ORDER — ALLOPURINOL 100 MG PO TABS: 100.0000 mg | ORAL_TABLET | Freq: Every day | ORAL | 1 refills | Status: AC

## 2021-05-30 LAB — URINE CULTURE

## 2021-06-01 DIAGNOSIS — E1122 Type 2 diabetes mellitus with diabetic chronic kidney disease: Secondary | ICD-10-CM | POA: Diagnosis not present

## 2021-06-01 DIAGNOSIS — N1832 Chronic kidney disease, stage 3b: Secondary | ICD-10-CM | POA: Diagnosis not present

## 2021-06-01 DIAGNOSIS — I10 Essential (primary) hypertension: Secondary | ICD-10-CM | POA: Diagnosis not present

## 2021-06-05 ENCOUNTER — Other Ambulatory Visit: Payer: Self-pay | Admitting: Internal Medicine

## 2021-06-05 DIAGNOSIS — E039 Hypothyroidism, unspecified: Secondary | ICD-10-CM | POA: Insufficient documentation

## 2021-06-05 DIAGNOSIS — E119 Type 2 diabetes mellitus without complications: Secondary | ICD-10-CM | POA: Insufficient documentation

## 2021-06-05 DIAGNOSIS — R309 Painful micturition, unspecified: Secondary | ICD-10-CM | POA: Insufficient documentation

## 2021-06-05 MED ORDER — FLUTICASONE PROPIONATE 50 MCG/ACT NA SUSP: NASAL | 0 refills | Status: DC

## 2021-06-05 NOTE — Telephone Encounter (Signed)
Requested Prescriptions  Pending Prescriptions Disp Refills   fluticasone (FLONASE) 50 MCG/ACT nasal spray 16 g 4     Ear, Nose, and Throat: Nasal Preparations - Corticosteroids Passed - 06/05/2021  2:12 PM      Passed - Valid encounter within last 12 months    Recent Outpatient Visits          1 week ago Painful urination   [place], MD   3 months ago Chronic gout without tophus, unspecified cause, unspecified site   [facility] Vigg, Avanti, MD   3 months ago Elevated TSH   [facility] Vigg, Avanti, MD   7 months ago COVID-19   [employer], Avanti, MD   7 months ago COVID-6   [place], MD      Future Appointments            In 2 weeks Vigg, Avanti, MD [facility], [facility]   In 7 months  [facility], [place]

## 2021-06-05 NOTE — Telephone Encounter (Signed)
Medication Refill - Medication:  fluticasone (FLONASE) 50 MCG/ACT nasal spray   Has the patient contacted their pharmacy? Yes.     Preferred Pharmacy (with phone number or street name):  [place], [place] - [address] Phone:  [phone]  Fax:  [phone]      Has the patient been seen for an appointment in the last year OR does the patient have an upcoming appointment? Yes.    Agent: Please be advised that RX refills may take up to 3 business days. We ask that you follow-up with your pharmacy.

## 2021-06-09 ENCOUNTER — Other Ambulatory Visit: Payer: Self-pay | Admitting: Internal Medicine

## 2021-06-09 ENCOUNTER — Telehealth: Payer: Self-pay | Admitting: Internal Medicine

## 2021-06-09 NOTE — Telephone Encounter (Signed)
Requested medications are due for refill today.  no  Requested medications are on the active medications list.  no  Last refill. 12/13/2020  Future visit scheduled.   yes  Notes to clinic.  Medication d/c'd by Dr. Neomia Dear 02/23/2021.    Requested Prescriptions  Pending Prescriptions Disp Refills   fexofenadine (ALLEGRA) 180 MG tablet [Pharmacy Med Name: FEXOFENADINE HCL 180 MG TABLET] 90 tablet 1    Sig: TAKE 1 TABLET BY MOUTH EVERY DAY     Ear, Nose, and Throat:  Antihistamines Passed - 06/09/2021  1:26 AM      Passed - Valid encounter within last 12 months    Recent Outpatient Visits           2 weeks ago Painful urination   [place] Vigg, Avanti, MD   3 months ago Chronic gout without tophus, unspecified cause, unspecified site   [facility] Vigg, Avanti, MD   3 months ago Elevated TSH   [facility] Vigg, Avanti, MD   7 months ago COVID-19   [employer], Avanti, MD   7 months ago COVID-16   [employer], Customer service manager, MD       Future Appointments             In 1 week Vigg, Avanti, MD [facility], [place]   In 7 months  [facility], [place]

## 2021-06-09 NOTE — Telephone Encounter (Signed)
Pt is calling to report that her [facility] insurance is requiring a prior authorization for her  butalbital-acetaminophen-caffeine (FIORICET) 50-325-40 MG tablet [[id number]]   CB- [phone]

## 2021-06-13 ENCOUNTER — Other Ambulatory Visit: Payer: Self-pay | Admitting: Internal Medicine

## 2021-06-13 NOTE — Telephone Encounter (Signed)
Requested Prescriptions  Pending Prescriptions Disp Refills   hydrOXYzine (VISTARIL) 50 MG capsule [Pharmacy Med Name: HYDROXYZINE PAMOATE 50 MG CAP] 90 capsule 0    Sig: TAKE 1 CAPSULE BY MOUTH EVERY 8 HOURS ASNEEDED.     Ear, Nose, and Throat:  Antihistamines 2 Failed - 06/13/2021 12:10 PM      Failed - Cr in normal range and within 360 days    Creatinine  Date Value Ref Range Status  07/03/2012 2.82 (H) 0.60 - 1.30 mg/dL Final   Creatinine, Ser  Date Value Ref Range Status  05/21/2021 1.50 (H) 0.44 - 1.00 mg/dL Final         Passed - Valid encounter within last 12 months    Recent Outpatient Visits          2 weeks ago Painful urination   [place], MD   3 months ago Chronic gout without tophus, unspecified cause, unspecified site   [facility] Vigg, Avanti, MD   3 months ago Elevated TSH   [facility] Vigg, Avanti, MD   7 months ago COVID-45   [employer], Avanti, MD   7 months ago COVID-33   [employer], Customer service manager, MD      Future Appointments            In 1 week Vigg, Avanti, MD [facility], [facility]   In 7 months  [facility], [place]

## 2021-06-14 ENCOUNTER — Other Ambulatory Visit: Payer: Medicare Other

## 2021-06-14 ENCOUNTER — Other Ambulatory Visit: Payer: Self-pay | Admitting: Internal Medicine

## 2021-06-14 ENCOUNTER — Other Ambulatory Visit: Payer: Self-pay

## 2021-06-14 DIAGNOSIS — E119 Type 2 diabetes mellitus without complications: Secondary | ICD-10-CM | POA: Diagnosis not present

## 2021-06-14 LAB — MICROALBUMIN, URINE WAIVED
Creatinine, Urine Waived: 200 mg/dL (ref 10–300)
Microalb, Ur Waived: 30 mg/L — ABNORMAL HIGH (ref 0–19)
Microalb/Creat Ratio: <30 mg/g (ref <30)

## 2021-06-14 LAB — BAYER DCA HB A1C WAIVED: HB A1C (BAYER DCA - WAIVED): 6.2 % — ABNORMAL HIGH (ref 4.8–5.6)

## 2021-06-14 NOTE — Progress Notes (Signed)
Doesn't need fioricet , started pt on nurtec last visit.  Katie Singh

## 2021-06-14 NOTE — Telephone Encounter (Signed)
Patient made aware and verbalized understanding.

## 2021-06-15 LAB — LIPID PANEL
Chol/HDL Ratio: 3.7 ratio (ref 0.0–4.4)
Cholesterol, Total: 138 mg/dL (ref 100–199)
HDL: 37 mg/dL — ABNORMAL LOW (ref >39)
LDL Chol Calc (NIH): 87 mg/dL (ref 0–99)
Triglycerides: 70 mg/dL (ref 0–149)
VLDL Cholesterol Cal: 14 mg/dL (ref 5–40)

## 2021-06-15 LAB — COMPREHENSIVE METABOLIC PANEL
ALT: 12 IU/L (ref 0–32)
AST: 10 IU/L (ref 0–40)
Albumin/Globulin Ratio: 1.4 (ref 1.2–2.2)
Albumin: 4 g/dL (ref 3.7–4.7)
Alkaline Phosphatase: 172 IU/L — ABNORMAL HIGH (ref 44–121)
BUN/Creatinine Ratio: 13 (ref 12–28)
BUN: 23 mg/dL (ref 8–27)
Bilirubin Total: 0.4 mg/dL (ref 0.0–1.2)
CO2: 22 mmol/L (ref 20–29)
Calcium: 8.8 mg/dL (ref 8.7–10.3)
Chloride: 104 mmol/L (ref 96–106)
Creatinine, Ser: 1.81 mg/dL — ABNORMAL HIGH (ref 0.57–1.00)
Globulin, Total: 2.9 g/dL (ref 1.5–4.5)
Glucose: 129 mg/dL — ABNORMAL HIGH (ref 70–99)
Potassium: 4.1 mmol/L (ref 3.5–5.2)
Sodium: 139 mmol/L (ref 134–144)
Total Protein: 6.9 g/dL (ref 6.0–8.5)
eGFR: 30 mL/min/{1.73_m2} — ABNORMAL LOW (ref >59)

## 2021-06-19 ENCOUNTER — Ambulatory Visit: Payer: Self-pay

## 2021-06-19 NOTE — Telephone Encounter (Signed)
°  Chief Complaint: eye drainage Symptoms: L eye drainage, itching, pink, head congestion Frequency: 1 week Pertinent Negatives: Patient denies pain to eye Disposition: [] ED /[] Urgent Care (no appt availability in office) / [x] Appointment(In office/virtual)/ []  [facility]/ [] Home Care/ [] Refused Recommended Disposition /[] [place] [facility]/ []  Follow-up with PCP Additional Notes: rescheduled pt's appt from 06/22/21 to tomorrow to discuss several things in office with Dr. Neomia Dear. Pt was asking about urine from last week if showed infection d/t having dysuria. Advised her that urine was to check kidneys and would need to recollect to check for infection but can discuss tomorrow   Summary: Eye issue   Patient called in she have an upcoming appointment on 06/22/21 but states that her left eye is a little pink and getting gunky and need to know if she will have to be seen in office for this. She does however say she have a cold and they do that and wanted to know from last labs did she have a UTI because she is experiencing some burning when she urinates. Please advise and call Ph# [phone]      Reason for Disposition  [1] Eye with yellow or green discharge, or eyelashes stick together AND [2] NO PCP standing order to call in antibiotic eye drops  (Exception: [place]; continue triage.)  Answer Assessment - Initial Assessment Questions 1. EYE DISCHARGE: "Is the discharge in one or both eyes?" "What color is it?" "How much is there?" "When did the discharge start?"      whitish 2. REDNESS OF SCLERA: "Is the redness in one or both eyes?" "When did the redness start?"      Pinkish on Left eye 3. EYELIDS: "Are the eyelids red or swollen?" If Yes, ask: "How much?"      No 4. VISION: "Is there any difficulty seeing clearly?"      No 5. PAIN: "Is there any pain? If Yes, ask: "How bad is it?" (Scale 1-10; or mild, moderate, severe)    - MILD (1-3): doesn't interfere with normal activities      - MODERATE (4-7): interferes with normal activities or awakens from sleep    - SEVERE (8-10): excruciating pain, unable to do any normal activities       NO 6. CONTACT LENS: "Do you wear contacts?"     glasses 7. OTHER SYMPTOMS: "Do you have any other symptoms?" (e.g., fever, runny nose, cough)     Congestion for 1 week  Protocols used: Eye - Pus or Discharge-A-AH

## 2021-06-20 ENCOUNTER — Encounter: Payer: Self-pay | Admitting: Internal Medicine

## 2021-06-20 ENCOUNTER — Ambulatory Visit (INDEPENDENT_AMBULATORY_CARE_PROVIDER_SITE_OTHER): Payer: Medicare Other | Admitting: Internal Medicine

## 2021-06-20 ENCOUNTER — Other Ambulatory Visit: Payer: Self-pay

## 2021-06-20 VITALS — BP 121/78 | HR 73 | Temp 97.7°F | Ht 68.11 in | Wt 243.2 lb

## 2021-06-20 DIAGNOSIS — E119 Type 2 diabetes mellitus without complications: Secondary | ICD-10-CM | POA: Diagnosis not present

## 2021-06-20 DIAGNOSIS — R32 Unspecified urinary incontinence: Secondary | ICD-10-CM

## 2021-06-20 DIAGNOSIS — E039 Hypothyroidism, unspecified: Secondary | ICD-10-CM

## 2021-06-20 DIAGNOSIS — R748 Abnormal levels of other serum enzymes: Secondary | ICD-10-CM | POA: Diagnosis not present

## 2021-06-20 MED ORDER — NEOMYCIN-POLYMYXIN-HC 3.5-10000-1 OP SUSP: 3.0000 [drp] | Freq: Four times a day (QID) | OPHTHALMIC | 0 refills | Status: DC

## 2021-06-20 MED ORDER — OXYBUTYNIN CHLORIDE ER 10 MG PO TB24: 10.0000 mg | ORAL_TABLET | Freq: Every day | ORAL | 3 refills | Status: DC

## 2021-06-20 MED ORDER — FLUTICASONE PROPIONATE 50 MCG/ACT NA SUSP: NASAL | 11 refills | Status: AC

## 2021-06-20 MED ORDER — ONDANSETRON 8 MG PO TBDP: 8.0000 mg | ORAL_TABLET | Freq: Three times a day (TID) | ORAL | 0 refills | Status: DC | PRN

## 2021-06-20 NOTE — Progress Notes (Signed)
BP 121/78    Pulse 73    Temp 97.7 F (36.5 C) (Oral)    Ht 5' 8.11" (1.73 m)    Wt 243 lb 3.2 oz (110.3 kg)    SpO2 97%    BMI 36.86 kg/m    Subjective:    Patient ID: Katie Singh, female    DOB: 06-29-1950, 71 y.o.   MRN: [id number]  Chief Complaint  Patient presents with   Nasal Congestion    Cough, For the past 2 to 3 days ago   Diabetes   Hypertension   Hypothyroidism    HPI: Katie Singh is a 71 y.o. female  Pts husband passed recently she feels she feels is hard situation and feels like he was suffering but is in a better place.  Diabetes She presents for her follow-up diabetic visit. She has type 2 diabetes mellitus. Pertinent negatives for hypoglycemia include no confusion, dizziness, headaches, speech difficulty or tremors. Pertinent negatives for diabetes include no chest pain, no fatigue, no polydipsia, no polyphagia, no polyuria and no weakness.  Hypertension This is a chronic problem. The current episode started more than 1 year ago. Pertinent negatives include no chest pain, headaches, palpitations or shortness of breath.   Chief Complaint  Patient presents with   Nasal Congestion    Cough, For the past 2 to 3 days ago   Diabetes   Hypertension   Hypothyroidism    Relevant past medical, surgical, family and social history reviewed and updated as indicated. Interim medical history since our last visit reviewed. Allergies and medications reviewed and updated.  Review of Systems  Constitutional:  Negative for activity change, appetite change, chills, fatigue and fever.  HENT:  Negative for congestion, ear discharge, ear pain and facial swelling.   Eyes:  Negative for pain and itching.  Respiratory:  Negative for cough, chest tightness, shortness of breath and wheezing.   Cardiovascular:  Negative for chest pain, palpitations and leg swelling.  Gastrointestinal:  Negative for abdominal distention, abdominal pain, blood in stool, constipation,  diarrhea, nausea and vomiting.  Endocrine: Negative for cold intolerance, heat intolerance, polydipsia, polyphagia and polyuria.  Genitourinary:  Negative for difficulty urinating, dysuria, flank pain, frequency, hematuria and urgency.  Musculoskeletal:  Negative for arthralgias, gait problem, joint swelling and myalgias.  Skin:  Negative for color change, rash and wound.  Neurological:  Negative for dizziness, tremors, speech difficulty, weakness, light-headedness, numbness and headaches.  Hematological:  Does not bruise/bleed easily.  Psychiatric/Behavioral:  Negative for agitation, confusion, decreased concentration, sleep disturbance and suicidal ideas.    Per HPI unless specifically indicated above     Objective:    BP 121/78    Pulse 73    Temp 97.7 F (36.5 C) (Oral)    Ht 5' 8.11" (1.73 m)    Wt 243 lb 3.2 oz (110.3 kg)    SpO2 97%    BMI 36.86 kg/m   Wt Readings from Last 3 Encounters:  06/20/21 243 lb 3.2 oz (110.3 kg)  05/24/21 252 lb (114.3 kg)  05/21/21 300 lb (136.1 kg)    Physical Exam Vitals and nursing note reviewed.  Constitutional:      General: She is not in acute distress.    Appearance: Normal appearance. She is not ill-appearing or diaphoretic.  Eyes:     Conjunctiva/sclera: Conjunctivae normal.  Pulmonary:     Breath sounds: No rhonchi.  Abdominal:     General: Abdomen is flat. Bowel sounds are normal.  There is no distension.     Palpations: Abdomen is soft. There is no mass.     Tenderness: There is no abdominal tenderness. There is no guarding.  Skin:    General: Skin is warm and dry.     Coloration: Skin is not jaundiced.     Findings: No erythema.  Neurological:     Mental Status: She is alert.    Results for orders placed or performed in visit on 06/14/21  Lipid panel  Result Value Ref Range   Cholesterol, Total 138 100 - 199 mg/dL   Triglycerides 70 0 - 149 mg/dL   HDL 37 (L) >39 mg/dL   VLDL Cholesterol Cal 14 5 - 40 mg/dL   LDL Chol  Calc (NIH) 87 0 - 99 mg/dL   Chol/HDL Ratio 3.7 0.0 - 4.4 ratio  Microalbumin, Urine Waived  Result Value Ref Range   Microalb, Ur Waived 30 (H) 0 - 19 mg/L   Creatinine, Urine Waived 200 10 - 300 mg/dL   Microalb/Creat Ratio <30 <30 mg/g  Comprehensive metabolic panel  Result Value Ref Range   Glucose 129 (H) 70 - 99 mg/dL   BUN 23 8 - 27 mg/dL   Creatinine, Ser 1.81 (H) 0.57 - 1.00 mg/dL   eGFR 30 (L) >59 mL/min/1.73   BUN/Creatinine Ratio 13 12 - 28   Sodium 139 134 - 144 mmol/L   Potassium 4.1 3.5 - 5.2 mmol/L   Chloride 104 96 - 106 mmol/L   CO2 22 20 - 29 mmol/L   Calcium 8.8 8.7 - 10.3 mg/dL   Total Protein 6.9 6.0 - 8.5 g/dL   Albumin 4.0 3.7 - 4.7 g/dL   Globulin, Total 2.9 1.5 - 4.5 g/dL   Albumin/Globulin Ratio 1.4 1.2 - 2.2   Bilirubin Total 0.4 0.0 - 1.2 mg/dL   Alkaline Phosphatase 172 (H) 44 - 121 IU/L   AST 10 0 - 40 IU/L   ALT 12 0 - 32 IU/L  Bayer DCA Hb A1c Waived  Result Value Ref Range   HB A1C (BAYER DCA - WAIVED) 6.2 (H) 4.8 - 5.6 %        Current Outpatient Medications:    albuterol (VENTOLIN HFA) 108 (90 Base) MCG/ACT inhaler, TAKE 2 PUFFS BY MOUTH EVERY 6 HOURS AS NEEDED FOR WHEEZE OR SHORTNESS OF BREATH, Disp: 6.7 each, Rfl: 2   allopurinol (ZYLOPRIM) 100 MG tablet, Take 1 tablet (100 mg total) by mouth daily., Disp: 90 tablet, Rfl: 1   aspirin 325 MG tablet, Take by mouth., Disp: , Rfl:    fexofenadine (ALLEGRA) 180 MG tablet, TAKE 1 TABLET BY MOUTH EVERY DAY, Disp: 90 tablet, Rfl: 1   furosemide (LASIX) 40 MG tablet, Take 1 tablet (40 mg total) by mouth daily., Disp: 90 tablet, Rfl: 4   hydrOXYzine (VISTARIL) 50 MG capsule, TAKE 1 CAPSULE BY MOUTH EVERY 8 HOURS ASNEEDED., Disp: 90 capsule, Rfl: 0   levothyroxine (SYNTHROID) 75 MCG tablet, Take 1 tablet (75 mcg total) by mouth daily before breakfast., Disp: 30 tablet, Rfl: 5   losartan (COZAAR) 25 MG tablet, Take 1 tablet (25 mg total) by mouth daily., Disp: 90 tablet, Rfl: 4   methocarbamol  (ROBAXIN) 750 MG tablet, TAKE 1/2 TO 1 TABLET BY MOUTH TWICE A DAY AS NEEDED, Disp: , Rfl:    PARoxetine (PAXIL) 40 MG tablet, TAKE 1 TABLET BY MOUTH EVERY DAY IN THE MORNING, Disp: 90 tablet, Rfl: 3   pregabalin (LYRICA) 75 MG capsule,  Take 1 capsule (75 mg total) by mouth daily., Disp: 30 capsule, Rfl: 2   Rimegepant Sulfate (NURTEC) 75 MG TBDP, Take 75 mg by mouth as needed (use once a day)., Disp: 8 tablet, Rfl: 1   sitaGLIPtin (JANUVIA) 25 MG tablet, Take 1 tablet (25 mg total) by mouth daily., Disp: 90 tablet, Rfl: 4   topiramate (TOPAMAX) 25 MG tablet, Take 1 tablet (25 mg total) by mouth daily., Disp: 30 tablet, Rfl: 2   diltiazem (CARDIZEM CD) 180 MG 24 hr capsule, TAKE 1 CAPSULE BY MOUTH EVERY DAY (Patient not taking: Reported on 06/20/2021), Disp: 90 capsule, Rfl: 1   fluticasone (FLONASE) 50 MCG/ACT nasal spray, SPRAY 2 SPRAYS INTO EACH NOSTRIL EVERY DAY Strength: 50 MCG/ACT, Disp: 16 g, Rfl: 11   ondansetron (ZOFRAN-ODT) 8 MG disintegrating tablet, Take 1 tablet (8 mg total) by mouth every 8 (eight) hours as needed for nausea or vomiting., Disp: 20 tablet, Rfl: 0    Assessment & Plan:  Migraines  Failed imitrex , is on nurtec  Will add topamax , took one of the nurtec. Hasnt had one in a while since starting topamax   2. Htn  HTN : is on losartan 25 mg daily.  Continue current meds.  Medication compliance emphasised. pt advised to keep Bp logs. Pt verbalised understanding of the same. Pt to have a low salt diet . Exercise to reach a goal of at least 150 mins a week.  lifestyle modifications explained and pt understands importance of the above.     3. Hypothyroidism  Is on 75 mcg of synthroid. Chronic, ongoing with recent TSH within normal range.  Continue current medication regimen and adjust as needed.  Plan to recheck TSH at physical in 6 months.     4. Dm a1c stable.  check HbA1c,  urine  microalbumin  diabetic diet plan given to pt  adviced regarding hypoglycemia and  instructions given to pt today on how to prevent and treat the same if it were to occur. pt acknowledges the plan and voices understanding of the same.  exercise plan given and encouraged.   advice diabetic yearly podiatry, ophthalmology , nutritionist , dental check q 6 months  5. Incontinence ? Urge / stress incontinence.  Will start pt on oxybutynin  Seen obgyn ? Prolapse   6. Conjunctivitis  Will start pt on polymyxin    7. Elevated alkaline phosph / lighter stools  Will need to get an RUQ  Willl refer  Problem List Items Addressed This Visit   None    No orders of the defined types were placed in this encounter.    Meds ordered this encounter  Medications   fluticasone (FLONASE) 50 MCG/ACT nasal spray    Sig: SPRAY 2 SPRAYS INTO EACH NOSTRIL EVERY DAY Strength: 50 MCG/ACT    Dispense:  16 g    Refill:  11   ondansetron (ZOFRAN-ODT) 8 MG disintegrating tablet    Sig: Take 1 tablet (8 mg total) by mouth every 8 (eight) hours as needed for nausea or vomiting.    Dispense:  20 tablet    Refill:  0     Follow up plan: No follow-ups on file.

## 2021-06-21 ENCOUNTER — Ambulatory Visit: Payer: Medicare Other | Admitting: Internal Medicine

## 2021-06-21 LAB — ALKALINE PHOSPHATASE: Alkaline Phosphatase: 176 IU/L — ABNORMAL HIGH (ref 44–121)

## 2021-06-21 LAB — TSH: TSH: 3.37 u[IU]/mL (ref 0.450–4.500)

## 2021-06-21 LAB — T4, FREE: Free T4: 1.3 ng/dL (ref 0.82–1.77)

## 2021-06-21 LAB — BILIRUBIN, DIRECT: Bilirubin, Direct: 0.11 mg/dL (ref 0.00–0.40)

## 2021-06-21 NOTE — Progress Notes (Signed)
Pl let pt know will need th RUQ [place] soon, alk phosph highr, to call us if feels worse/ if stools change consitency/ color / worsening abdominal pain then will need to go to the ER> Her thyroid levels are wnl continue current dose of meds.

## 2021-06-22 ENCOUNTER — Telehealth: Payer: Medicare Other | Admitting: Internal Medicine

## 2021-06-27 ENCOUNTER — Ambulatory Visit
Admission: RE | Admit: 2021-06-27 | Discharge: 2021-06-27 | Disposition: A | Discharge status: Home / Self Care | Payer: Medicare Other | Source: Ambulatory Visit | Attending: Internal Medicine | Admitting: Internal Medicine

## 2021-06-27 DIAGNOSIS — R748 Abnormal levels of other serum enzymes: Secondary | ICD-10-CM | POA: Insufficient documentation

## 2021-06-28 NOTE — Progress Notes (Signed)
Please let pt know this was normal.

## 2021-07-18 ENCOUNTER — Other Ambulatory Visit: Payer: Self-pay | Admitting: Internal Medicine

## 2021-07-18 ENCOUNTER — Ambulatory Visit: Payer: Medicare Other | Admitting: Internal Medicine

## 2021-07-18 NOTE — Telephone Encounter (Signed)
Medication Refill - Medication:  ?hydrOXYzine (VISTARIL) 50 MG capsule  ? ?Has the patient contacted their pharmacy? Yes.   ?Contact PCP ? ?Preferred Pharmacy (with phone number or street name):  ?[facility] - [place], [place] - [place]  ?[address] [place], [place] [address]  ?Phone:  [phone]  Fax:  [phone]  ? ?Has the patient been seen for an appointment in the last year OR does the patient have an upcoming appointment? Yes.   ? ?Agent: Please be advised that RX refills may take up to 3 business days. We ask that you follow-up with your pharmacy. ?

## 2021-07-19 MED ORDER — HYDROXYZINE PAMOATE 50 MG PO CAPS
ORAL_CAPSULE | ORAL | 0 refills | Status: AC
Start: 2021-07-19 — End: ?

## 2021-07-19 NOTE — Telephone Encounter (Signed)
Requested Prescriptions  ?Pending Prescriptions Disp Refills  ?? hydrOXYzine (VISTARIL) 50 MG capsule 90 capsule 0  ?  ? Ear, Nose, and Throat:  Antihistamines 2 Failed - 07/18/2021  3:36 PM  ?  ?  Failed - Cr in normal range and within 360 days  ?  Creatinine  ?Date Value Ref Range Status  ?07/03/2012 2.82 (H) 0.60 - 1.30 mg/dL Final  ? ?Creatinine, Ser  ?Date Value Ref Range Status  ?06/14/2021 1.81 (H) 0.57 - 1.00 mg/dL Final  ?   ?  ?  Passed - Valid encounter within last 12 months  ?  Recent Outpatient Visits   ?      ? 4 weeks ago Elevated alkaline phosphatase level  ? [facility] Vigg, Avanti, MD  ? 1 month ago Painful urination  ? [facility] Vigg, Avanti, MD  ? 4 months ago Chronic gout without tophus, unspecified cause, unspecified site  ? [facility] Vigg, Avanti, MD  ? 4 months ago Elevated TSH  ? [facility] Vigg, Avanti, MD  ? 8 months ago COVID-19  ? [facility] Vigg, Avanti, MD  ?  ?  ?Future Appointments   ?        ? In 1 month Vigg, Avanti, MD [facility]  ? In 6 months  [facility]  ?  ? ?  ?  ?  ? ? ?

## 2021-08-08 ENCOUNTER — Encounter: Payer: Self-pay | Admitting: Internal Medicine

## 2021-08-09 ENCOUNTER — Encounter: Payer: Self-pay | Admitting: Obstetrics and Gynecology

## 2021-08-09 ENCOUNTER — Ambulatory Visit (INDEPENDENT_AMBULATORY_CARE_PROVIDER_SITE_OTHER): Payer: Medicare Other | Admitting: Obstetrics and Gynecology

## 2021-08-09 VITALS — BP 95/82 | HR 138 | Ht 68.0 in | Wt 236.8 lb

## 2021-08-09 DIAGNOSIS — N393 Stress incontinence (female) (male): Secondary | ICD-10-CM | POA: Diagnosis not present

## 2021-08-09 DIAGNOSIS — N3941 Urge incontinence: Secondary | ICD-10-CM

## 2021-08-09 NOTE — Progress Notes (Signed)
Patient presents today discuss urinary incontince. She states this has been ongoing for a few years and happens with standing, coughing, getting out of the car and after urination. Patient states she recently started antibiotics for UTI on 08/05/21. Patient states no other questions or concerns.  ?

## 2021-08-09 NOTE — Progress Notes (Signed)
HPI: ?     [name] is a 71 y.o. [id number] who LMP was No LMP recorded. Patient has had an ablation. ? ?Subjective:  ? ?She presents today with complaint of urine loss daily.  This occurs with coughing laughing sneezing or walking on uneven surfaces etc.  She must wear a pad all the time.  She does not lose large amounts of urine.  She also complains of urinating and not feeling empty where she has to stand and then sit back down and urinate again.  She states that she has had frequent UTIs since the age of 74. ? ?  Hx: ?The following portions of the patient's history were reviewed and updated as appropriate: ?            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 ?She does not have any pertinent problems on file. ?She  has a past surgical history that includes Endometrial ablation; Cataract extraction (Left); Tubal ligation (1988); and Distal interphalangeal joint fusion (Right, 01/19/2016). ?Her family history includes Cancer in her father; Diabetes in her mother; Heart disease in her mother. ?She  reports that she has never smoked. She has never used smokeless tobacco. She reports that she does not drink alcohol and does not use drugs. ?She has a current medication list which includes the following prescription(s): albuterol, allopurinol, aspirin, fexofenadine, fluticasone, furosemide, hydroxyzine, levothyroxine, losartan, methocarbamol, paroxetine, pregabalin, nurtec, sitagliptin, and topiramate. ?She is allergic to clarithromycin, abilify  [aripiprazole], amoxicillin-pot clavulanate, ciprofloxacin, lisinopril, lorcaserin, macrodantin [nitrofurantoin macrocrystal], olanzapine, pantoprazole, sulfa antibiotics, and penicillins. ?      ?Review of Systems:  ?Review of Systems ? ?Constitutional: Denied constitutional symptoms, night sweats, recent illness, fatigue, fever, insomnia and weight loss.  ?Eyes: Denied eye symptoms, eye pain, photophobia, vision change and visual disturbance.  ?Ears/Nose/Throat/Neck: Denied ear, nose, throat or neck symptoms, hearing loss, nasal discharge, sinus congestion and sore throat.  ?Cardiovascular: Denied cardiovascular symptoms, arrhythmia, chest pain/pressure, edema, exercise intolerance, orthopnea and palpitations.  ?Respiratory: Denied pulmonary symptoms, asthma, pleuritic pain, productive sputum, cough, dyspnea and wheezing.  ?Gastrointestinal: Denied, gastro-esophageal reflux, melena, nausea and vomiting.  ?Genitourinary: Denied genitourinary symptoms including symptomatic vaginal discharge, pelvic relaxation issues, and urinary complaints.  ?Musculoskeletal: Denied musculoskeletal symptoms, stiffness, swelling, muscle weakness and myalgia.  ?Dermatologic: Denied dermatology symptoms, rash and scar.  ?Neurologic: Denied neurology symptoms, dizziness, headache, neck pain and syncope.  ?Psychiatric: Denied psychiatric symptoms, anxiety and depression.  ?Endocrine: Denied endocrine symptoms including hot flashes and night sweats.  ? ?Meds: ?  ?Current Outpatient Medications on File Prior to Visit  ?Medication Sig Dispense Refill  ? albuterol (VENTOLIN HFA) 108 (90 Base) MCG/ACT inhaler TAKE 2 PUFFS BY MOUTH EVERY 6 HOURS AS NEEDED FOR WHEEZE OR SHORTNESS OF BREATH 6.7 each 2  ? allopurinol (ZYLOPRIM) 100 MG tablet Take 1 tablet (100 mg total) by mouth daily. 90 tablet 1  ? aspirin 325 MG tablet Take by mouth.    ? fexofenadine (ALLEGRA) 180 MG tablet TAKE 1 TABLET BY MOUTH EVERY DAY 90 tablet 1  ? fluticasone (FLONASE)  50 MCG/ACT nasal spray SPRAY 2 SPRAYS INTO EACH NOSTRIL EVERY DAY ?Strength: 50 MCG/ACT 16 g 11  ?  furosemide (LASIX) 40 MG tablet Take 1 tablet (40 mg total) by mouth daily. 90 tablet 4  ? hydrOXYzine (VISTARIL) 50 MG capsule TAKE 1 CAPSULE BY MOUTH EVERY 8 HOURS ASNEEDED. Strength: 50 mg 90 capsule 0  ? levothyroxine (SYNTHROID) 75 MCG tablet Take 1 tablet (75 mcg total) by mouth daily before breakfast. 30 tablet 5  ? losartan (COZAAR) 25 MG tablet Take 1 tablet (25 mg total) by mouth daily. 90 tablet 4  ? methocarbamol (ROBAXIN) 750 MG tablet TAKE 1/2 TO 1 TABLET BY MOUTH TWICE A DAY AS NEEDED    ? PARoxetine (PAXIL) 40 MG tablet TAKE 1 TABLET BY MOUTH EVERY DAY IN THE MORNING 90 tablet 3  ? pregabalin (LYRICA) 75 MG capsule Take 1 capsule (75 mg total) by mouth daily. 30 capsule 2  ? Rimegepant Sulfate (NURTEC) 75 MG TBDP Take 75 mg by mouth as needed (use once a day). 8 tablet 1  ? sitaGLIPtin (JANUVIA) 25 MG tablet Take 1 tablet (25 mg total) by mouth daily. 90 tablet 4  ? topiramate (TOPAMAX) 25 MG tablet Take 1 tablet (25 mg total) by mouth daily. 30 tablet 2  ? ?No current facility-administered medications on file prior to visit.  ? ? ? ? ?Objective:  ?  ? ?Vitals:  ? 08/09/21 1455  ?BP: 95/82  ?Pulse: (!) 138  ? ?Filed Weights  ? 08/09/21 1455  ?Weight: 236 lb 12.8 oz (107.4 kg)  ? ?  ?         Physical examination ?  Pelvic:   ?Vulva: Normal appearance.  No lesions.  ?Vagina: No lesions or abnormalities noted.  ?Support: Normal pelvic support.  ?Urethra No masses tenderness or scarring.  ?Meatus Normal size without lesions or prolapse.  ?Cervix: Normal appearance.  No lesions.  ?Anus: Normal exam.  No lesions.  ?Perineum: Normal exam.  No lesions.  ?      Bimanual   ?Uterus: Normal size.  Non-tender.  Mobile.  AV.  ?Adnexae: No masses.  Non-tender to palpation.  ?Cul-de-sac: Negative for abnormality.  ? ? ?        ? ?Assessment:  ?  ?[id number] ?Patient Active Problem List  ? Diagnosis Date Noted  ? Elevated  alkaline phosphatase level 06/20/2021  ? Urinary incontinence 06/20/2021  ? Painful urination 06/05/2021  ? Diabetes mellitus without complication ([place]) 13/12/6576  ? Hypothyroidism 06/05/2021  ? Chronic gout without tophus 03/14/2021  ? Hypertension associated with diabetes ([place]) 03/14/2021  ? Heart murmur 03/14/2021  ? Chronic migraine without aura without status migrainosus, not intractable 02/21/2021  ? Mild aortic valve stenosis 11/23/2020  ? Palpitations 10/05/2020  ? Sinus pressure 06/29/2020  ? 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 Elevated TSH 01/07/2020  ? Dyspnea 12/17/2019  ? Long term prescription benzodiazepine use 07/01/2019  ? Morbid obesity ([place]) 07/01/2019  ? Inflammatory arthritis 04/19/2019  ? Anxiety 03/08/2017  ? Advanced care planning/counseling discussion 03/08/2017  ? CKD (chronic kidney disease) stage 4, GFR 15-29 ml/min (HCC) 05/02/2016  ? Osteoarthritis 12/14/2015  ? Major depression, chronic 12/14/2015  ? DM type 2 causing CKD stage 4 ([place]) 04/26/2015  ? Myelodysplasia 04/26/2015  ? Hypertension   ? Sleep apnea syndrome   ? Hyperlipidemia associated with type 2 diabetes mellitus ([place])   ? Chronic diarrhea 03/29/2014  ? ?  ?1. Stress incontinence   ? ? Patient has good vaginal support for age 80.  No significant cystocele rectocele or uterine descensus. ?She does have obvious stress incontinence by  history doubt urge incontinence.  Patient was previously tried on anticholinergics and she hated these.  They also did not work. ? ? ?Plan:  ?  ?       ? 1.  Recommend urology consult for possible treatment of incontinence. ? 2.  Should urology not be willing to simply treat her incontinence I would consider a urethral sling even in the absence of hysterectomy to try to give her some relief from urine loss. ?Orders ?No orders of the defined types were placed in this encounter. ? ? No orders of the defined types were placed in this encounter. ?  ?  F/U ? No follow-ups on file. ?I spent 34 minutes  involved in the care of this patient preparing to see the patient by obtaining and reviewing her medical history (including labs, imaging tests and prior procedures), documenting clinical information in the ele

## 2021-08-15 ENCOUNTER — Ambulatory Visit: Payer: Self-pay

## 2021-08-15 NOTE — Telephone Encounter (Signed)
?  Chief Complaint: SOB - Wheezing ?Symptoms: SOB and wheezing ?Frequency: 1 week ?Pertinent Negatives: Patient denies Fever ?Disposition: '[]'$ ED /'[]'$ Urgent Care (no appt availability in office) / '[x]'$ Appointment(In office/virtual)/ '[]'$  [facility]/ '[]'$ Home Care/ '[]'$ Refused Recommended Disposition /'[]'$ [facility]/ '[]'$  Follow-up with PCP ?Additional Notes: Pt would prefer to be seen in the office tomorrow. Pt will monitor s/s tonight and seek care if needed. ? ?Pt states that she was on a antibiotic for a UTI and breathing was better. ? ? ? ? ?Reason for Disposition ? [1] MILD difficulty breathing (e.g., minimal/no SOB at rest, SOB with walking, pulse <100) AND [2] NEW-onset or WORSE than normal ? ?Answer Assessment - Initial Assessment Questions ?1. RESPIRATORY STATUS: "Describe your breathing?" (e.g., wheezing, shortness of breath, unable to speak, severe coughing)  ?    SOB at night wheezing ?2. ONSET: "When did this breathing problem begin?"  ?    1 week ago ?3. PATTERN "Does the difficult breathing come and go, or has it been constant since it started?"  ?    Comes and goes ?4. SEVERITY: "How bad is your breathing?" (e.g., mild, moderate, severe)  ?  - MILD: No SOB at rest, mild SOB with walking, speaks normally in sentences, can lie down, no retractions, pulse < 100.  ?  - MODERATE: SOB at rest, SOB with minimal exertion and prefers to sit, cannot lie down flat, speaks in phrases, mild retractions, audible wheezing, pulse 100-120.  ?  - SEVERE: Very SOB at rest, speaks in single words, struggling to breathe, sitting hunched forward, retractions, pulse > 120  ?    moderate ?5. RECURRENT SYMPTOM: "Have you had difficulty breathing before?" If Yes, ask: "When was the last time?" and "What happened that time?"  ?    Had asthma as a youth ?6. CARDIAC HISTORY: "Do you have any history of heart disease?" (e.g., heart attack, angina, bypass surgery, angioplasty)  ?    no ?7. LUNG HISTORY: "Do you have any  history of lung disease?"  (e.g., pulmonary embolus, asthma, emphysema) ?    no ?8. CAUSE: "What do you think is causing the breathing problem?"  ?    Infection ?9. OTHER SYMPTOMS: "Do you have any other symptoms? (e.g., dizziness, runny nose, cough, chest pain, fever) ?    No - A little cough ?10. O2 SATURATION MONITOR:  "Do you use an oxygen saturation monitor (pulse oximeter) at home?" If Yes, "What is your reading (oxygen level) today?" "What is your usual oxygen saturation reading?" (e.g., 95%) ?      Could not find meter ?11. PREGNANCY: "Is there any chance you are pregnant?" "When was your last menstrual period?" ?      na ?12. TRAVEL: "Have you traveled out of the country in the last month?" (e.g., travel history, exposures) ?      na ? ?Protocols used: Breathing Difficulty-A-AH ? ?

## 2021-08-16 ENCOUNTER — Ambulatory Visit: Payer: Medicare Other | Admitting: Internal Medicine

## 2021-08-16 NOTE — Telephone Encounter (Signed)
Pt cancelled her appointment this morning ?

## 2021-08-19 DIAGNOSIS — I340 Nonrheumatic mitral (valve) insufficiency: Secondary | ICD-10-CM | POA: Diagnosis not present

## 2021-08-19 DIAGNOSIS — L89151 Pressure ulcer of sacral region, stage 1: Secondary | ICD-10-CM | POA: Diagnosis not present

## 2021-08-19 DIAGNOSIS — I499 Cardiac arrhythmia, unspecified: Secondary | ICD-10-CM | POA: Diagnosis not present

## 2021-08-19 DIAGNOSIS — J811 Chronic pulmonary edema: Secondary | ICD-10-CM | POA: Diagnosis not present

## 2021-08-19 DIAGNOSIS — N1831 Chronic kidney disease, stage 3a: Secondary | ICD-10-CM | POA: Diagnosis not present

## 2021-08-19 DIAGNOSIS — E876 Hypokalemia: Secondary | ICD-10-CM | POA: Diagnosis not present

## 2021-08-19 DIAGNOSIS — I509 Heart failure, unspecified: Secondary | ICD-10-CM | POA: Diagnosis not present

## 2021-08-19 DIAGNOSIS — I429 Cardiomyopathy, unspecified: Secondary | ICD-10-CM | POA: Diagnosis not present

## 2021-08-19 DIAGNOSIS — J90 Pleural effusion, not elsewhere classified: Secondary | ICD-10-CM | POA: Diagnosis not present

## 2021-08-19 DIAGNOSIS — R079 Chest pain, unspecified: Secondary | ICD-10-CM | POA: Diagnosis not present

## 2021-08-19 DIAGNOSIS — N289 Disorder of kidney and ureter, unspecified: Secondary | ICD-10-CM | POA: Diagnosis not present

## 2021-08-19 DIAGNOSIS — L89153 Pressure ulcer of sacral region, stage 3: Secondary | ICD-10-CM | POA: Diagnosis not present

## 2021-08-19 DIAGNOSIS — L89159 Pressure ulcer of sacral region, unspecified stage: Secondary | ICD-10-CM | POA: Diagnosis not present

## 2021-08-19 DIAGNOSIS — R9431 Abnormal electrocardiogram [ECG] [EKG]: Secondary | ICD-10-CM | POA: Diagnosis not present

## 2021-08-19 DIAGNOSIS — R61 Generalized hyperhidrosis: Secondary | ICD-10-CM | POA: Diagnosis not present

## 2021-08-19 DIAGNOSIS — E1122 Type 2 diabetes mellitus with diabetic chronic kidney disease: Secondary | ICD-10-CM | POA: Diagnosis not present

## 2021-08-19 DIAGNOSIS — N1832 Chronic kidney disease, stage 3b: Secondary | ICD-10-CM | POA: Diagnosis not present

## 2021-08-19 DIAGNOSIS — Z20822 Contact with and (suspected) exposure to covid-19: Secondary | ICD-10-CM | POA: Diagnosis not present

## 2021-08-19 DIAGNOSIS — I5021 Acute systolic (congestive) heart failure: Secondary | ICD-10-CM | POA: Diagnosis not present

## 2021-08-19 DIAGNOSIS — R0609 Other forms of dyspnea: Secondary | ICD-10-CM | POA: Diagnosis not present

## 2021-08-19 DIAGNOSIS — R059 Cough, unspecified: Secondary | ICD-10-CM | POA: Diagnosis not present

## 2021-08-19 DIAGNOSIS — N183 Chronic kidney disease, stage 3 unspecified: Secondary | ICD-10-CM | POA: Diagnosis not present

## 2021-08-19 DIAGNOSIS — R000 Tachycardia, unspecified: Secondary | ICD-10-CM | POA: Diagnosis not present

## 2021-08-19 DIAGNOSIS — I483 Typical atrial flutter: Secondary | ICD-10-CM | POA: Diagnosis not present

## 2021-08-19 DIAGNOSIS — N179 Acute kidney failure, unspecified: Secondary | ICD-10-CM | POA: Diagnosis not present

## 2021-08-19 DIAGNOSIS — I517 Cardiomegaly: Secondary | ICD-10-CM | POA: Diagnosis not present

## 2021-08-19 DIAGNOSIS — R0602 Shortness of breath: Secondary | ICD-10-CM | POA: Diagnosis not present

## 2021-08-19 DIAGNOSIS — I5023 Acute on chronic systolic (congestive) heart failure: Secondary | ICD-10-CM | POA: Diagnosis not present

## 2021-08-19 DIAGNOSIS — I4892 Unspecified atrial flutter: Secondary | ICD-10-CM | POA: Diagnosis not present

## 2021-08-19 DIAGNOSIS — N189 Chronic kidney disease, unspecified: Secondary | ICD-10-CM | POA: Diagnosis not present

## 2021-08-19 DIAGNOSIS — I5020 Unspecified systolic (congestive) heart failure: Secondary | ICD-10-CM | POA: Diagnosis not present

## 2021-08-19 DIAGNOSIS — I130 Hypertensive heart and chronic kidney disease with heart failure and stage 1 through stage 4 chronic kidney disease, or unspecified chronic kidney disease: Secondary | ICD-10-CM | POA: Diagnosis not present

## 2021-08-19 DIAGNOSIS — I110 Hypertensive heart disease with heart failure: Secondary | ICD-10-CM | POA: Diagnosis not present

## 2021-08-20 DIAGNOSIS — I5023 Acute on chronic systolic (congestive) heart failure: Secondary | ICD-10-CM | POA: Diagnosis not present

## 2021-08-21 ENCOUNTER — Ambulatory Visit: Payer: Medicare Other | Admitting: Internal Medicine

## 2021-08-21 DIAGNOSIS — I4892 Unspecified atrial flutter: Secondary | ICD-10-CM | POA: Diagnosis not present

## 2021-08-21 DIAGNOSIS — R0609 Other forms of dyspnea: Secondary | ICD-10-CM | POA: Diagnosis not present

## 2021-08-21 DIAGNOSIS — I517 Cardiomegaly: Secondary | ICD-10-CM | POA: Diagnosis not present

## 2021-08-24 DIAGNOSIS — I5023 Acute on chronic systolic (congestive) heart failure: Secondary | ICD-10-CM | POA: Diagnosis not present

## 2021-08-26 DIAGNOSIS — I5023 Acute on chronic systolic (congestive) heart failure: Secondary | ICD-10-CM | POA: Diagnosis not present

## 2021-08-28 ENCOUNTER — Ambulatory Visit: Payer: Medicare Other | Admitting: Internal Medicine

## 2021-08-30 ENCOUNTER — Telehealth: Payer: Self-pay | Admitting: Internal Medicine

## 2021-08-30 ENCOUNTER — Encounter: Payer: Self-pay | Admitting: Internal Medicine

## 2021-08-30 ENCOUNTER — Ambulatory Visit (INDEPENDENT_AMBULATORY_CARE_PROVIDER_SITE_OTHER): Payer: Medicare HMO | Admitting: Internal Medicine

## 2021-08-30 VITALS — BP 110/72 | HR 52 | Temp 98.1°F | Ht 67.95 in | Wt 232.6 lb

## 2021-08-30 DIAGNOSIS — L89159 Pressure ulcer of sacral region, unspecified stage: Secondary | ICD-10-CM | POA: Diagnosis not present

## 2021-08-30 DIAGNOSIS — E119 Type 2 diabetes mellitus without complications: Secondary | ICD-10-CM

## 2021-08-30 DIAGNOSIS — I4892 Unspecified atrial flutter: Secondary | ICD-10-CM

## 2021-08-30 LAB — URINALYSIS, ROUTINE W REFLEX MICROSCOPIC
Bilirubin, UA: NEGATIVE
Glucose, UA: NEGATIVE
Ketones, UA: NEGATIVE
Nitrite, UA: NEGATIVE
Protein,UA: NEGATIVE
Specific Gravity, UA: 1.015 (ref 1.005–1.030)
Urobilinogen, Ur: 0.2 mg/dL (ref 0.2–1.0)
pH, UA: 5.5 (ref 5.0–7.5)

## 2021-08-30 LAB — MICROSCOPIC EXAMINATION

## 2021-08-30 LAB — BAYER DCA HB A1C WAIVED: HB A1C (BAYER DCA - WAIVED): 5.8 % — ABNORMAL HIGH (ref 4.8–5.6)

## 2021-08-30 MED ORDER — NYSTATIN 100000 UNIT/GM EX POWD: 1.0000 "application " | Freq: Three times a day (TID) | CUTANEOUS | 0 refills | Status: AC

## 2021-08-30 MED ORDER — SANTYL 250 UNIT/GM EX OINT: 1.0000 "application " | TOPICAL_OINTMENT | Freq: Every day | CUTANEOUS | 2 refills | Status: AC

## 2021-08-30 MED ORDER — CLOTRIMAZOLE-BETAMETHASONE 1-0.05 % EX CREA: 1.0000 "application " | TOPICAL_CREAM | Freq: Every day | CUTANEOUS | 0 refills | Status: AC

## 2021-08-30 MED ORDER — FLUCONAZOLE 150 MG PO TABS: 150.0000 mg | ORAL_TABLET | Freq: Once | ORAL | 0 refills | Status: AC

## 2021-08-30 NOTE — Progress Notes (Signed)
? ?BP 110/72   Pulse (!) 52   Temp 98.1 ?F (36.7 ?C) (Oral)   Ht 5' 7.95" (1.726 m)   Wt 232 lb 9.6 oz (105.5 kg)   SpO2 96%   BMI 35.42 kg/m?   ? ?Subjective:  ? ? Patient ID: Katie Singh, female    DOB: 08-16-1950, 71 y.o.   MRN: [id number] ? ?Chief Complaint  ?Patient presents with  ?? Hospitalization Follow-up  ?  Was in A-fib, had ablation in [facility], has appt with Dr. Candiss Norse in May. Went to Hospital originally for SOB on Saturday. Patient also has decubitus ulcer on sacrum  ? ? ?HPI: ?Katie Singh is a 71 y.o. female ? ?Was hospitalzied x 1 week  ?@ [facility] was @ [place] then ICU @ [place] ?Per cardiology notes on care everywhere pt was dmitted with SOBOE was diagnosed with a CHF exacerabation secondary to tachyarrhythmia , had a TEE and successful flutter ablation 4/12 with resolution of aflutter, she was found to have AKI as well during her hospitlaization.  ? ? ?Chief Complaint  ?Patient presents with  ?? Hospitalization Follow-up  ?  Was in A-fib, had ablation in [facility], has appt with Dr. Candiss Norse in May. Went to Hospital originally for SOB on Saturday. Patient also has decubitus ulcer on sacrum  ? ? ?Relevant past medical, surgical, family and social history reviewed and updated as indicated. Interim medical history since our last visit reviewed. ?Allergies and medications reviewed and updated. ? ?Review of Systems ? ?Per HPI unless specifically indicated above ? ?   ?Objective:  ?  ?BP 110/72   Pulse (!) 52   Temp 98.1 ?F (36.7 ?C) (Oral)   Ht 5' 7.95" (1.726 m)   Wt 232 lb 9.6 oz (105.5 kg)   SpO2 96%   BMI 35.42 kg/m?   ?Wt Readings from Last 3 Encounters:  ?08/30/21 232 lb 9.6 oz (105.5 kg)  ?08/09/21 236 lb 12.8 oz (107.4 kg)  ?06/20/21 243 lb 3.2 oz (110.3 kg)  ?  ?Physical Exam ?Vitals and nursing note reviewed.  ?Constitutional:   ?   General: She is not in acute distress. ?   Appearance: Normal appearance. She is not ill-appearing or diaphoretic.  ?Eyes:  ?   Conjunctiva/sclera:  Conjunctivae normal.  ?Cardiovascular:  ?   Rate and Rhythm: Regular rhythm. Bradycardia present.  ?   Heart sounds: No murmur heard. ?  No friction rub. No gallop.  ?Pulmonary:  ?   Breath sounds: No rhonchi.  ?Abdominal:  ?   Tenderness: There is no abdominal tenderness. There is no guarding.  ?Musculoskeletal:     ?   General: No swelling.  ?Skin: ?   General: Skin is warm and dry.  ?   Coloration: Skin is not jaundiced.  ?   Findings: Bruising, lesion and rash present. No erythema.  ?   Comments: Open wound on sacrum  ?No discharge  ?Has an area around the wound which looks purple ? ?Erythematous Rash noted on the labia and right side of her groin.  ?  ?Neurological:  ?   Mental Status: She is alert.  ?   Cranial Nerves: No cranial nerve deficit.  ?Psychiatric:     ?   Mood and Affect: Mood normal.     ?   Behavior: Behavior normal.     ?   Thought Content: Thought content normal.  ? ?Results for orders placed or performed in visit on 06/20/21  ?Bilirubin,  Direct  ?Result Value Ref Range  ? Bilirubin, Direct 0.11 0.00 - 0.40 mg/dL  ?TSH  ?Result Value Ref Range  ? TSH 3.370 0.450 - 4.500 uIU/mL  ?T4, free  ?Result Value Ref Range  ? Free T4 1.30 0.82 - 1.77 ng/dL  ?Alkaline phosphatase  ?Result Value Ref Range  ? Alkaline Phosphatase 176 (H) 44 - 121 IU/L  ? ?   ? ? ?Current Outpatient Medications:  ??  albuterol (VENTOLIN HFA) 108 (90 Base) MCG/ACT inhaler, TAKE 2 PUFFS BY MOUTH EVERY 6 HOURS AS NEEDED FOR WHEEZE OR SHORTNESS OF BREATH, Disp: 6.7 each, Rfl: 2 ??  allopurinol (ZYLOPRIM) 100 MG tablet, Take 1 tablet (100 mg total) by mouth daily., Disp: 90 tablet, Rfl: 1 ??  apixaban (ELIQUIS) 5 MG TABS tablet, Take by mouth., Disp: , Rfl:  ??  clotrimazole-betamethasone (LOTRISONE) cream, Apply 1 application. topically daily., Disp: 30 g, Rfl: 0 ??  fexofenadine (ALLEGRA) 180 MG tablet, TAKE 1 TABLET BY MOUTH EVERY DAY, Disp: 90 tablet, Rfl: 1 ??  fluconazole (DIFLUCAN) 150 MG tablet, Take 1 tablet (150 mg  total) by mouth once for 1 dose., Disp: 1 tablet, Rfl: 0 ??  fluticasone (FLONASE) 50 MCG/ACT nasal spray, SPRAY 2 SPRAYS INTO EACH NOSTRIL EVERY DAY Strength: 50 MCG/ACT, Disp: 16 g, Rfl: 11 ??  furosemide (LASIX) 40 MG tablet, Take 1 tablet (40 mg total) by mouth daily., Disp: 90 tablet, Rfl: 4 ??  hydrOXYzine (VISTARIL) 50 MG capsule, TAKE 1 CAPSULE BY MOUTH EVERY 8 HOURS ASNEEDED. Strength: 50 mg, Disp: 90 capsule, Rfl: 0 ??  levothyroxine (SYNTHROID) 75 MCG tablet, Take 1 tablet (75 mcg total) by mouth daily before breakfast., Disp: 30 tablet, Rfl: 5 ??  methocarbamol (ROBAXIN) 750 MG tablet, TAKE 1/2 TO 1 TABLET BY MOUTH TWICE A DAY AS NEEDED, Disp: , Rfl:  ??  nystatin (MYCOSTATIN/NYSTOP) powder, Apply 1 application. topically 3 (three) times daily., Disp: 15 g, Rfl: 0 ??  PARoxetine (PAXIL) 40 MG tablet, TAKE 1 TABLET BY MOUTH EVERY DAY IN THE MORNING, Disp: 90 tablet, Rfl: 3 ??  pregabalin (LYRICA) 75 MG capsule, Take 1 capsule (75 mg total) by mouth daily., Disp: 30 capsule, Rfl: 2 ??  Rimegepant Sulfate (NURTEC) 75 MG TBDP, Take 75 mg by mouth as needed (use once a day)., Disp: 8 tablet, Rfl: 1 ??  sitaGLIPtin (JANUVIA) 25 MG tablet, Take 1 tablet (25 mg total) by mouth daily., Disp: 90 tablet, Rfl: 4 ??  losartan (COZAAR) 25 MG tablet, Take 1 tablet (25 mg total) by mouth daily. (Patient not taking: Reported on 08/30/2021), Disp: 90 tablet, Rfl: 4 ??  topiramate (TOPAMAX) 25 MG tablet, Take 1 tablet (25 mg total) by mouth daily., Disp: 30 tablet, Rfl: 2  ? ? ?Assessment & Plan:  ?Sacral decubitus ulcer is getting better per pt.  ?Has not been prescribed or discharged with any medications will refer to wound clinic and start patient on Santyl to apply on the open wound patient is diabetic and would like to avoid complications and aid in wound healing check A1c today ? ?2. Afib to fu with cardiology is on eliquis ?Seeing @ [facility] cards may 3rd she was prescribed metoprolol 50 mg daily which might need to be  reduced given that she is bradycardic at the heart rate of 50 today. ?Also on Eliquis and Lasix for such follow-up and management per cardiology ? ?3. CKD is seeing nephrology for such ?Fu and mx per Dr. Candiss Norse ?Recheck Creat today  Cmp checked ? ?4. Tinea cruris : will start pt on  ?Clotrimazole and nystatin along with Diflucan x1 to cure rash on genitalia/labia ? ? ?5.  Diabetes check levels today patient has noted an A1c since her last visit blood sugars in the hospital per her verbal record were within normal limits. ?She was discharged home on Januvia 25 mg daily. ? ?Problem List Items Addressed This Visit   ? ?  ? Cardiovascular and Mediastinum  ? Atrial flutter ([place])  ? Relevant Medications  ? apixaban (ELIQUIS) 5 MG TABS tablet  ?  ? Endocrine  ? Diabetes mellitus without complication ([place]) - Primary  ? Relevant Orders  ? CBC with Differential/Platelet  ? Comprehensive metabolic panel  ? Urinalysis, Routine w reflex microscopic  ? Bayer DCA Hb A1c Waived  ? Vitamin B12  ? Ambulatory referral to Wound Clinic  ?  ? Other  ? Pressure injury of skin of sacral region  ? Relevant Orders  ? Ambulatory referral to Wound Clinic  ?  ? ?Orders Placed This Encounter  ?Procedures  ?? CBC with Differential/Platelet  ?? Comprehensive metabolic panel  ?? Urinalysis, Routine w reflex microscopic  ?? Bayer DCA Hb A1c Waived  ?? Vitamin B12  ?? Ambulatory referral to Wound Clinic  ?  ? ?Meds ordered this encounter  ?Medications  ?? clotrimazole-betamethasone (LOTRISONE) cream  ?  Sig: Apply 1 application. topically daily.  ?  Dispense:  30 g  ?  Refill:  0  ?? nystatin (MYCOSTATIN/NYSTOP) powder  ?  Sig: Apply 1 application. topically 3 (three) times daily.  ?  Dispense:  15 g  ?  Refill:  0  ?? fluconazole (DIFLUCAN) 150 MG tablet  ?  Sig: Take 1 tablet (150 mg total) by mouth once for 1 dose.  ?  Dispense:  1 tablet  ?  Refill:  0  ?  ? ?Follow up plan: ?No follow-ups on file. ? ? ? ?

## 2021-08-30 NOTE — Telephone Encounter (Signed)
Maddie calling from the pharmacy is requesting more information for the collagenase (SANTYL) 250 UNIT/GM ointment  ?She says they need to know the wound size and the dates that the patient needs to use it. Please advise ? ? ?[place], [place]  ?[place] [address]  ?Phone: [phone] Fax: [phone]  ? ?

## 2021-08-31 ENCOUNTER — Telehealth: Payer: Self-pay | Admitting: Internal Medicine

## 2021-08-31 LAB — CBC WITH DIFFERENTIAL/PLATELET
Basophils Absolute: 0.1 10*3/uL (ref 0.0–0.2)
Basos: 1 %
EOS (ABSOLUTE): 0.2 10*3/uL (ref 0.0–0.4)
Eos: 3 %
Hematocrit: 38.6 % (ref 34.0–46.6)
Hemoglobin: 13 g/dL (ref 11.1–15.9)
Immature Grans (Abs): 0 10*3/uL (ref 0.0–0.1)
Immature Granulocytes: 0 %
Lymphocytes Absolute: 1.1 10*3/uL (ref 0.7–3.1)
Lymphs: 19 %
MCH: 27.6 pg (ref 26.6–33.0)
MCHC: 33.7 g/dL (ref 31.5–35.7)
MCV: 82 fL (ref 79–97)
Monocytes Absolute: 0.4 10*3/uL (ref 0.1–0.9)
Monocytes: 8 %
Neutrophils Absolute: 3.8 10*3/uL (ref 1.4–7.0)
Neutrophils: 69 %
Platelets: 342 10*3/uL (ref 150–450)
RBC: 4.71 x10E6/uL (ref 3.77–5.28)
RDW: 12.8 % (ref 11.7–15.4)
WBC: 5.6 10*3/uL (ref 3.4–10.8)

## 2021-08-31 LAB — VITAMIN B12: Vitamin B-12: 276 pg/mL (ref 232–1245)

## 2021-08-31 LAB — COMPREHENSIVE METABOLIC PANEL
ALT: 16 IU/L (ref 0–32)
AST: 11 IU/L (ref 0–40)
Albumin/Globulin Ratio: 1.5 (ref 1.2–2.2)
Albumin: 4.5 g/dL (ref 3.7–4.7)
Alkaline Phosphatase: 123 IU/L — ABNORMAL HIGH (ref 44–121)
BUN/Creatinine Ratio: 16 (ref 12–28)
BUN: 37 mg/dL — ABNORMAL HIGH (ref 8–27)
Bilirubin Total: 0.6 mg/dL (ref 0.0–1.2)
CO2: 22 mmol/L (ref 20–29)
Calcium: 9.4 mg/dL (ref 8.7–10.3)
Chloride: 102 mmol/L (ref 96–106)
Creatinine, Ser: 2.37 mg/dL — ABNORMAL HIGH (ref 0.57–1.00)
Globulin, Total: 3.1 g/dL (ref 1.5–4.5)
Glucose: 101 mg/dL — ABNORMAL HIGH (ref 70–99)
Potassium: 4.8 mmol/L (ref 3.5–5.2)
Sodium: 142 mmol/L (ref 134–144)
Total Protein: 7.6 g/dL (ref 6.0–8.5)
eGFR: 21 mL/min/{1.73_m2} — ABNORMAL LOW (ref >59)

## 2021-08-31 NOTE — Progress Notes (Signed)
Patient is still high matter fact increased from the 1.8-2.27 please have her see nephrology sooner than when her appointment was next month. ?If she can get in their office this week or the next , will be better.  Please let patient know thank you

## 2021-08-31 NOTE — Telephone Encounter (Signed)
Copied from [place] [id number]. Topic: General - Other ?>> Aug 30, 2021  4:40 PM Yvette Rack wrote: ?Reason for CRM: Pt stated she was told to call back to advise Dr. Neomia Dear of the dosage for metoprolol. Pt stated it is metoprolol 50 MG once daily. ?

## 2021-09-01 ENCOUNTER — Other Ambulatory Visit: Payer: Self-pay | Admitting: Nurse Practitioner

## 2021-09-01 ENCOUNTER — Other Ambulatory Visit: Payer: Self-pay | Admitting: Internal Medicine

## 2021-09-01 DIAGNOSIS — I5023 Acute on chronic systolic (congestive) heart failure: Secondary | ICD-10-CM | POA: Diagnosis not present

## 2021-09-01 DIAGNOSIS — F419 Anxiety disorder, unspecified: Secondary | ICD-10-CM

## 2021-09-01 NOTE — Telephone Encounter (Signed)
Pharmacy called and was given measurements of 5cm x 5 cm and to use for at least 30 days, depending on wound care recommendations. per Dr. Neomia Dear ?

## 2021-09-01 NOTE — Telephone Encounter (Signed)
Sherry with [facility] called in asking for more information information pertaining to medication sent in on 08/30/21. Need a call back from nurse of Dr Neomia Dear please call Ph# [phone] ?

## 2021-09-01 NOTE — Telephone Encounter (Signed)
Requested Prescriptions  ?Pending Prescriptions Disp Refills  ?? PARoxetine (PAXIL) 40 MG tablet [Pharmacy Med Name: PAROXETINE HCL 40 MG TAB] 90 tablet 3  ?  Sig: TAKE 1 TABLET BY MOUTH EVERY MORNING  ?  ? Psychiatry:  Antidepressants - SSRI Passed - 09/01/2021  9:50 AM  ?  ?  Passed - Completed PHQ-2 or PHQ-9 in the last 360 days  ?  ?  Passed - Valid encounter within last 6 months  ?  Recent Outpatient Visits   ?      ? 2 days ago Diabetes mellitus without complication ([place])  ? [facility] Vigg, Avanti, MD  ? 2 months ago Elevated alkaline phosphatase level  ? [facility] Vigg, Avanti, MD  ? 3 months ago Painful urination  ? [facility] Vigg, Avanti, MD  ? 5 months ago Chronic gout without tophus, unspecified cause, unspecified site  ? [facility] Vigg, Avanti, MD  ? 6 months ago Elevated TSH  ? [facility] Vigg, Avanti, MD  ?  ?  ?Future Appointments   ?        ? In 3 weeks Vigg, Avanti, MD [facility]  ? In 4 months  [place]  ?  ? ?  ?  ?  ? ?

## 2021-09-01 NOTE — Telephone Encounter (Signed)
Requested medications are due for refill today.  yes ? ?Requested medications are on the active medications list.  yes ? ?Last refill. Both refilled 05/24/2021 #30 2 refills. ? ?Future visit scheduled.   yes ? ?Notes to clinic.  Lyrica is not delegated. Topamax rx written to expired 06/23/2021 - Rx is expired. ? ? ? ?Requested Prescriptions  ?Pending Prescriptions Disp Refills  ? topiramate (TOPAMAX) 25 MG tablet [Pharmacy Med Name: TOPIRAMATE 25 MG TAB] 30 tablet 2  ?  Sig: TAKE 1 TABLET BY MOUTH DAILY.  ?  ? Neurology: Anticonvulsants - topiramate & zonisamide Failed - 09/01/2021  4:11 PM  ?  ?  Failed - Cr in normal range and within 360 days  ?  Creatinine  ?Date Value Ref Range Status  ?07/03/2012 2.82 (H) 0.60 - 1.30 mg/dL Final  ? ?Creatinine, Ser  ?Date Value Ref Range Status  ?08/30/2021 2.37 (H) 0.57 - 1.00 mg/dL Final  ?  ?  ?  ?  Passed - CO2 in normal range and within 360 days  ?  CO2  ?Date Value Ref Range Status  ?08/30/2021 22 20 - 29 mmol/L Final  ?  ?  ?  ?  Passed - ALT in normal range and within 360 days  ?  ALT  ?Date Value Ref Range Status  ?08/30/2021 16 0 - 32 IU/L Final  ? ?SGPT (ALT)  ?Date Value Ref Range Status  ?07/03/2012 18 12 - 78 U/L Final  ? ?ALT (SGPT) Piccolo, Waived  ?Date Value Ref Range Status  ?09/17/2017 24 10 - 47 U/L Final  ?  ?  ?  ?  Passed - AST in normal range and within 360 days  ?  AST  ?Date Value Ref Range Status  ?08/30/2021 11 0 - 40 IU/L Final  ? ?SGOT(AST)  ?Date Value Ref Range Status  ?07/03/2012 21 15 - 37 Unit/L Final  ? ?AST (SGOT) Piccolo, Waived  ?Date Value Ref Range Status  ?09/17/2017 16 11 - 38 U/L Final  ?  ?  ?  ?  Passed - Completed PHQ-2 or PHQ-9 in the last 360 days  ?  ?  Passed - Valid encounter within last 12 months  ?  Recent Outpatient Visits   ? ?      ? 2 days ago Diabetes mellitus without complication ([place])  ? [facility] Vigg, Avanti, MD  ? 2 months ago Elevated alkaline phosphatase level  ? [facility] Vigg,  Avanti, MD  ? 3 months ago Painful urination  ? [facility] Vigg, Avanti, MD  ? 5 months ago Chronic gout without tophus, unspecified cause, unspecified site  ? [facility] Vigg, Avanti, MD  ? 6 months ago Elevated TSH  ? [facility] Vigg, Avanti, MD  ? ?  ?  ?Future Appointments   ? ?        ? In 3 weeks Vigg, Avanti, MD [facility]  ? In 4 months  [place]  ? ?  ? ? ?  ?  ?  ? pregabalin (LYRICA) 75 MG capsule [Pharmacy Med Name: PREGABALIN 75 MG CAP] 30 capsule   ?  Sig: TAKE 1 CAPSULE BY MOUTH DAILY.  ?  ? Not Delegated - Neurology:  Anticonvulsants - Controlled - pregabalin Failed - 09/01/2021  4:11 PM  ?  ?  Failed - This refill cannot be delegated  ?  ?  Failed - Cr in  normal range and within 360 days  ?  Creatinine  ?Date Value Ref Range Status  ?07/03/2012 2.82 (H) 0.60 - 1.30 mg/dL Final  ? ?Creatinine, Ser  ?Date Value Ref Range Status  ?08/30/2021 2.37 (H) 0.57 - 1.00 mg/dL Final  ?  ?  ?  ?  Passed - Completed PHQ-2 or PHQ-9 in the last 360 days  ?  ?  Passed - Valid encounter within last 12 months  ?  Recent Outpatient Visits   ? ?      ? 2 days ago Diabetes mellitus without complication ([place])  ? [facility] Vigg, Avanti, MD  ? 2 months ago Elevated alkaline phosphatase level  ? [facility] Vigg, Avanti, MD  ? 3 months ago Painful urination  ? [facility] Vigg, Avanti, MD  ? 5 months ago Chronic gout without tophus, unspecified cause, unspecified site  ? [facility] Vigg, Avanti, MD  ? 6 months ago Elevated TSH  ? [facility] Vigg, Avanti, MD  ? ?  ?  ?Future Appointments   ? ?        ? In 3 weeks Vigg, Avanti, MD [facility]  ? In 4 months  [place]  ? ?  ? ? ?  ?  ?  ?  ?

## 2021-09-05 DIAGNOSIS — I10 Essential (primary) hypertension: Secondary | ICD-10-CM | POA: Diagnosis not present

## 2021-09-05 DIAGNOSIS — R8290 Unspecified abnormal findings in urine: Secondary | ICD-10-CM | POA: Diagnosis not present

## 2021-09-05 DIAGNOSIS — N1832 Chronic kidney disease, stage 3b: Secondary | ICD-10-CM | POA: Diagnosis not present

## 2021-09-05 DIAGNOSIS — N179 Acute kidney failure, unspecified: Secondary | ICD-10-CM | POA: Diagnosis not present

## 2021-09-05 DIAGNOSIS — E1122 Type 2 diabetes mellitus with diabetic chronic kidney disease: Secondary | ICD-10-CM | POA: Diagnosis not present

## 2021-09-12 ENCOUNTER — Other Ambulatory Visit: Payer: Self-pay | Admitting: Internal Medicine

## 2021-09-12 MED ORDER — FEXOFENADINE HCL 180 MG PO TABS
180.0000 mg | ORAL_TABLET | Freq: Every day | ORAL | 1 refills | Status: AC
Start: 2021-09-12 — End: ?

## 2021-09-12 NOTE — Telephone Encounter (Signed)
Requested Prescriptions  ?Pending Prescriptions Disp Refills  ?? fexofenadine (ALLEGRA) 180 MG tablet 90 tablet 1  ?  Sig: Take 1 tablet (180 mg total) by mouth daily.  ?  ? Ear, Nose, and Throat:  Antihistamines Passed - 09/12/2021  3:36 PM  ?  ?  Passed - Valid encounter within last 12 months  ?  Recent Outpatient Visits   ?      ? 1 week ago Diabetes mellitus without complication ([place])  ? [facility] Vigg, Avanti, MD  ? 2 months ago Elevated alkaline phosphatase level  ? [facility] Vigg, Avanti, MD  ? 3 months ago Painful urination  ? [facility] Vigg, Avanti, MD  ? 6 months ago Chronic gout without tophus, unspecified cause, unspecified site  ? [facility] Vigg, Avanti, MD  ? 6 months ago Elevated TSH  ? [facility] Vigg, Avanti, MD  ?  ?  ?Future Appointments   ?        ? In 2 weeks Vigg, Avanti, MD [facility]  ? In 4 months  [place], [facility]  ?  ? ?  ?  ?  ? ?

## 2021-09-12 NOTE — Telephone Encounter (Signed)
Medication Refill - Medication: fexofenadine (ALLEGRA) 180 MG tablet  ? ?Has the patient contacted their pharmacy? Yes.   ?(Agent: If yes, when and what did the pharmacy advise?)Need new Rx pt new to pharmacy  ? ?Preferred Pharmacy (with phone number or street name):  ?[place], [place] Phone:  [phone]  ?Fax:  [phone]  ?  ? ?Has the patient been seen for an appointment in the last year OR does the patient have an upcoming appointment? Yes.   ? ?Agent: Please be advised that RX refills may take up to 3 business days. We ask that you follow-up with your pharmacy. ? ?

## 2021-09-13 ENCOUNTER — Other Ambulatory Visit: Payer: Self-pay | Admitting: Nurse Practitioner

## 2021-09-13 DIAGNOSIS — E1122 Type 2 diabetes mellitus with diabetic chronic kidney disease: Secondary | ICD-10-CM | POA: Diagnosis not present

## 2021-09-13 DIAGNOSIS — I152 Hypertension secondary to endocrine disorders: Secondary | ICD-10-CM

## 2021-09-13 DIAGNOSIS — N179 Acute kidney failure, unspecified: Secondary | ICD-10-CM | POA: Diagnosis not present

## 2021-09-13 DIAGNOSIS — Z8679 Personal history of other diseases of the circulatory system: Secondary | ICD-10-CM | POA: Diagnosis not present

## 2021-09-13 DIAGNOSIS — I5022 Chronic systolic (congestive) heart failure: Secondary | ICD-10-CM | POA: Diagnosis not present

## 2021-09-13 DIAGNOSIS — R531 Weakness: Secondary | ICD-10-CM | POA: Diagnosis not present

## 2021-09-13 DIAGNOSIS — I130 Hypertensive heart and chronic kidney disease with heart failure and stage 1 through stage 4 chronic kidney disease, or unspecified chronic kidney disease: Secondary | ICD-10-CM | POA: Diagnosis not present

## 2021-09-13 DIAGNOSIS — I447 Left bundle-branch block, unspecified: Secondary | ICD-10-CM | POA: Diagnosis not present

## 2021-09-13 DIAGNOSIS — I4892 Unspecified atrial flutter: Secondary | ICD-10-CM | POA: Diagnosis not present

## 2021-09-13 DIAGNOSIS — E785 Hyperlipidemia, unspecified: Secondary | ICD-10-CM | POA: Diagnosis not present

## 2021-09-13 DIAGNOSIS — N189 Chronic kidney disease, unspecified: Secondary | ICD-10-CM | POA: Diagnosis not present

## 2021-09-14 NOTE — Telephone Encounter (Signed)
Requested medication (s) are due for refill today: Yes ? ?Requested medication (s) are on the active medication list: Yes ? ?Last refill:  06/29/20 ? ?Future visit scheduled: Yes ? ?Notes to clinic:  Prescription expired. ? ? ? ?Requested Prescriptions  ?Pending Prescriptions Disp Refills  ? furosemide (LASIX) 40 MG tablet [Pharmacy Med Name: FUROSEMIDE 40 MG TAB] 90 tablet 4  ?  Sig: TAKE 1 TABLET BY MOUTH DAILY  ?  ? Cardiovascular:  Diuretics - Loop Failed - 09/13/2021  3:41 PM  ?  ?  Failed - Cr in normal range and within 180 days  ?  Creatinine  ?Date Value Ref Range Status  ?07/03/2012 2.82 (H) 0.60 - 1.30 mg/dL Final  ? ?Creatinine, Ser  ?Date Value Ref Range Status  ?08/30/2021 2.37 (H) 0.57 - 1.00 mg/dL Final  ?  ?  ?  ?  Failed - Mg Level in normal range and within 180 days  ?  No results found for: MG  ?  ?  ?  Passed - K in normal range and within 180 days  ?  Potassium  ?Date Value Ref Range Status  ?08/30/2021 4.8 3.5 - 5.2 mmol/L Final  ?  ?  ?  ?  Passed - Ca in normal range and within 180 days  ?  Calcium  ?Date Value Ref Range Status  ?08/30/2021 9.4 8.7 - 10.3 mg/dL Final  ?  ?  ?  ?  Passed - Na in normal range and within 180 days  ?  Sodium  ?Date Value Ref Range Status  ?08/30/2021 142 134 - 144 mmol/L Final  ?  ?  ?  ?  Passed - Cl in normal range and within 180 days  ?  Chloride  ?Date Value Ref Range Status  ?08/30/2021 102 96 - 106 mmol/L Final  ?  ?  ?  ?  Passed - Last BP in normal range  ?  BP Readings from Last 1 Encounters:  ?08/30/21 110/72  ?  ?  ?  ?  Passed - Valid encounter within last 6 months  ?  Recent Outpatient Visits   ? ?      ? 2 weeks ago Diabetes mellitus without complication ([place])  ? [facility] Vigg, Avanti, MD  ? 2 months ago Elevated alkaline phosphatase level  ? [facility] Vigg, Avanti, MD  ? 3 months ago Painful urination  ? [facility] Vigg, Avanti, MD  ? 6 months ago Chronic gout without tophus, unspecified cause, unspecified  site  ? [facility] Vigg, Avanti, MD  ? 6 months ago Elevated TSH  ? [facility] Vigg, Avanti, MD  ? ?  ?  ?Future Appointments   ? ?        ? In 2 weeks Vigg, Avanti, MD [facility]  ? In 4 months  [place], [facility]  ? ?  ? ? ?  ?  ?  ? ?

## 2021-09-15 ENCOUNTER — Encounter: Payer: Medicare HMO | Attending: Physician Assistant | Admitting: Physician Assistant

## 2021-09-15 DIAGNOSIS — E11610 Type 2 diabetes mellitus with diabetic neuropathic arthropathy: Secondary | ICD-10-CM | POA: Diagnosis not present

## 2021-09-15 DIAGNOSIS — E1122 Type 2 diabetes mellitus with diabetic chronic kidney disease: Secondary | ICD-10-CM | POA: Insufficient documentation

## 2021-09-15 DIAGNOSIS — I4892 Unspecified atrial flutter: Secondary | ICD-10-CM | POA: Insufficient documentation

## 2021-09-15 DIAGNOSIS — E11621 Type 2 diabetes mellitus with foot ulcer: Secondary | ICD-10-CM | POA: Diagnosis not present

## 2021-09-15 DIAGNOSIS — N183 Chronic kidney disease, stage 3 unspecified: Secondary | ICD-10-CM | POA: Diagnosis not present

## 2021-09-15 DIAGNOSIS — I129 Hypertensive chronic kidney disease with stage 1 through stage 4 chronic kidney disease, or unspecified chronic kidney disease: Secondary | ICD-10-CM | POA: Insufficient documentation

## 2021-09-15 DIAGNOSIS — I10 Essential (primary) hypertension: Secondary | ICD-10-CM | POA: Diagnosis not present

## 2021-09-15 DIAGNOSIS — L89153 Pressure ulcer of sacral region, stage 3: Secondary | ICD-10-CM | POA: Diagnosis not present

## 2021-09-15 DIAGNOSIS — L98422 Non-pressure chronic ulcer of back with fat layer exposed: Secondary | ICD-10-CM | POA: Diagnosis not present

## 2021-09-15 DIAGNOSIS — E1140 Type 2 diabetes mellitus with diabetic neuropathy, unspecified: Secondary | ICD-10-CM | POA: Diagnosis not present

## 2021-09-15 NOTE — Progress Notes (Signed)
Katie Singh ([id number]) ?Visit Report for 09/15/2021 ?Abuse Risk Screen Details ?Patient Name: Katie Singh, Katie Singh. ?Date of Service: 09/15/2021 12:45 PM ?Medical Record Number: [id number] ?Patient Account Number: [id number] ?Date of Birth/Sex: [birth year] (71 y.o. F) ?Treating RN: Levora Dredge ?Primary Care Corinda Ammon: Charlynne Cousins Other Clinician: ?Referring [place] Surges: Neomia Dear, Avanti ?Treating Shahida Schnackenberg/Extender: Jeri Cos ?Weeks in Treatment: 0 ?Abuse Risk Screen Items ?Answer ?ABUSE RISK SCREEN: ?Has anyone close to you tried to hurt or harm you recentlyo No ?Do you feel uncomfortable with anyone in your familyo No ?Has anyone forced you do things that you didnot want to doo No ?Electronic Signature(s) ?Signed: 09/15/2021 3:51:04 PM By: Levora Dredge ?Entered By: Levora Dredge on 09/15/2021 13:08:24 ?Katie Singh, Katie Singh ([id number]) ?-------------------------------------------------------------------------------- ?Activities of Daily Living Details ?Patient Name: Katie Singh, Katie Singh. ?Date of Service: 09/15/2021 12:45 PM ?Medical Record Number: [id number] ?Patient Account Number: [id number] ?Date of Birth/Sex: 1950/09/13 (71 y.o. F) ?Treating RN: Levora Dredge ?Primary Care Braxdon Gappa: Charlynne Cousins Other Clinician: ?Referring Chianna Spirito: Neomia Dear, Avanti ?Treating Verdis Bassette/Extender: Jeri Cos ?Weeks in Treatment: 0 ?Activities of Daily Living Items ?Answer ?Activities of Daily Living (Please select one for each item) ?Marydel ?Take Medications Completely Able ?Use Telephone Completely Able ?Care for Appearance Completely Able ?Use Toilet Completely Able ?Bath / Shower Completely Able ?Dress Self Completely Able ?Feed Self Completely Able ?Walk Completely Able ?Get In / Out Bed Completely Able ?Housework Completely Able ?Prepare Meals Completely Able ?Handle Money Completely Able ?Shop for Self Completely Able ?Electronic Signature(s) ?Signed: 09/15/2021 3:51:04 PM By: Levora Dredge ?Entered By: Levora Dredge on 09/15/2021 13:08:46 ?Katie Singh, Katie Singh ([id number]) ?-------------------------------------------------------------------------------- ?Education Screening Details ?Patient Name: Katie Singh. ?Date of Service: 09/15/2021 12:45 PM ?Medical Record Number: [id number] ?Patient Account Number: [id number] ?Date of Birth/Sex: July 08, 1950 (71 y.o. F) ?Treating RN: Levora Dredge ?Primary Care Caedmon Louque: Charlynne Cousins Other Clinician: ?Referring Ronnel Zuercher: Neomia Dear, Avanti ?Treating Parth Mccormac/Extender: Jeri Cos ?Weeks in Treatment: 0 ?Learning Preferences/Education Level/Primary Language ?Learning Preference: Explanation, Demonstration, Video, Communication Board, Printed Material ?Preferred Language: English ?Cognitive Barrier ?Language Barrier: No ?Translator Needed: No ?Memory Deficit: No ?Emotional Barrier: No ?Cultural/Religious Beliefs Affecting Medical Care: No ?Physical Barrier ?Impaired Vision: Yes Glasses ?Impaired Hearing: No ?Decreased Hand dexterity: No ?Knowledge/Comprehension ?Knowledge Level: High ?Comprehension Level: High ?Ability to understand written instructions: High ?Ability to understand verbal instructions: High ?Motivation ?Anxiety Level: Calm ?Cooperation: Cooperative ?Education Importance: Acknowledges Need ?Interest in Health Problems: Asks Questions ?Perception: Coherent ?Willingness to Engage in Self-Management ?High ?Activities: ?Readiness to Engage in Self-Management ?High ?Activities: ?Electronic Signature(s) ?Signed: 09/15/2021 3:51:04 PM By: Levora Dredge ?Entered By: Levora Dredge on 09/15/2021 13:09:11 ?Katie Singh, Katie Singh ([id number]) ?-------------------------------------------------------------------------------- ?Fall Risk Assessment Details ?Patient Name: Katie Singh, Katie Singh. ?Date of Service: 09/15/2021 12:45 PM ?Medical Record Number: [id number] ?Patient Account Number: [id number] ?Date of Birth/Sex: 01/26/1951 (71 y.o. F) ?Treating RN: Levora Dredge ?Primary Care Milaya Hora: Charlynne Cousins Other Clinician: ?Referring Oryon Gary: Neomia Dear, Avanti ?Treating Maikol Grassia/Extender: Jeri Cos ?Weeks in Treatment: 0 ?Fall Risk Assessment Items ?Have you had 2 or more falls in the last 12 monthso 0 No ?Have you had any fall that resulted in injury in the last 12 monthso 0 No ?FALLS RISK SCREEN ?History of falling - immediate or within 3 months 0 No ?Secondary diagnosis (Do you have 2 or more medical diagnoseso) 0 No ?Ambulatory aid ?None/bed rest/wheelchair/nurse 0 Yes ?Crutches/cane/walker 0 No ?Furniture 0 No ?Intravenous therapy Access/Saline/Heparin Lock 0 No ?Gait/Transferring ?Normal/ bed rest/ wheelchair 0 Yes ?Weak (short steps with or without shuffle, stooped but able  to lift head while walking, may ?0 No ?seek support from furniture) ?Impaired (short steps with shuffle, may have difficulty arising from chair, head down, impaired ?0 No ?balance) ?Mental Status ?Oriented to own ability 0 Yes ?Electronic Signature(s) ?Signed: 09/15/2021 3:51:04 PM By: Levora Dredge ?Entered By: Levora Dredge on 09/15/2021 13:09:28 ?Katie Singh, Katie Singh ([id number]) ?-------------------------------------------------------------------------------- ?Foot Assessment Details ?Patient Name: Katie Singh, Katie Singh. ?Date of Service: 09/15/2021 12:45 PM ?Medical Record Number: [id number] ?Patient Account Number: [id number] ?Date of Birth/Sex: April 18, 1951 (71 y.o. F) ?Treating RN: Levora Dredge ?Primary Care Kristopher Attwood: Charlynne Cousins Other Clinician: ?Referring Judyth Demarais: Neomia Dear, Avanti ?Treating Damary Doland/Extender: Jeri Cos ?Weeks in Treatment: 0 ?Foot Assessment Items ?Site Locations ?+ = Sensation present, - = Sensation absent, C = Callus, U = Ulcer ?R = Redness, W = Warmth, M = Maceration, PU = Pre-ulcerative lesion ?F = Fissure, S = Swelling, D = Dryness ?Assessment ?Right: Left: ?Other Deformity: No No ?Prior Foot Ulcer: No No ?Prior Amputation: No No ?Charcot Joint: No No ?Ambulatory Status: Ambulatory Without Help ?Gait:  Steady ?Electronic Signature(s) ?Signed: 09/15/2021 3:51:04 PM By: Levora Dredge ?Entered By: Levora Dredge on 09/15/2021 13:09:51 ?Katie Singh, Katie Singh ([id number]) ?-------------------------------------------------------------------------------- ?Nutrition Risk Screening Details ?Patient Name: Katie Singh, Katie Singh. ?Date of Service: 09/15/2021 12:45 PM ?Medical Record Number: [id number] ?Patient Account Number: [id number] ?Date of Birth/Sex: 07-16-1950 (71 y.o. F) ?Treating RN: Levora Dredge ?Primary Care Katai Marsico: Charlynne Cousins Other Clinician: ?Referring Tennyson Kallen: Neomia Dear, Avanti ?Treating Chez Bulnes/Extender: Jeri Cos ?Weeks in Treatment: 0 ?Height (in): 68 ?Weight (lbs): 220 ?Body Mass Index (BMI): 33.4 ?Nutrition Risk Screening Items ?Score Screening ?NUTRITION RISK SCREEN: ?I have an illness or condition that made me change the kind and/or amount of food I eat 0 No ?I eat fewer than two meals per day 0 No ?I eat few fruits and vegetables, or milk products 0 No ?I have three or more drinks of beer, liquor or wine almost every day 0 No ?I have tooth or mouth problems that make it hard for me to eat 0 No ?I don't always have enough money to buy the food I need 0 No ?I eat alone most of the time 0 No ?I take three or more different prescribed or over-the-counter drugs a day 0 No ?Without wanting to, I have lost or gained 10 pounds in the last six months 0 No ?I am not always physically able to shop, cook and/or feed myself 0 No ?Nutrition Protocols ?Good Risk Protocol ?Moderate Risk Protocol 0 Provide education on nutrition ?High Risk Proctocol ?Risk Level: Good Risk ?Score: 0 ?Electronic Signature(s) ?Signed: 09/15/2021 3:51:04 PM By: Levora Dredge ?Entered By: Levora Dredge on 09/15/2021 13:09:40 ?

## 2021-09-15 NOTE — Progress Notes (Signed)
BRANDE, UNCAPHER ([id number]) ?Visit Report for 09/15/2021 ?Chief Complaint Document Details ?Patient Name: Katie Singh, Katie Singh. ?Date of Service: 09/15/2021 12:45 PM ?Medical Record Number: [id number] ?Patient Account Number: [id number] ?Date of Birth/Sex: 06-Jan-1951 (71 y.o. F) ?Treating RN: Levora Dredge ?Primary Care Provider: Charlynne Cousins Other Clinician: ?Referring Provider: Neomia Dear, Avanti ?Treating Provider/Extender: Jeri Cos ?Weeks in Treatment: 0 ?Information Obtained from: Patient ?Chief Complaint ?Sacral pressure ulcer ?Electronic Signature(s) ?Signed: 09/15/2021 1:28:28 PM By: Worthy Keeler PA-C ?Entered By: Worthy Keeler on 09/15/2021 13:28:28 ?Katie Singh, Katie Singh ([id number]) ?-------------------------------------------------------------------------------- ?Debridement Details ?Patient Name: Katie Singh, Katie Singh. ?Date of Service: 09/15/2021 12:45 PM ?Medical Record Number: [id number] ?Patient Account Number: [id number] ?Date of Birth/Sex: 01-17-1951 (71 y.o. F) ?Treating RN: Levora Dredge ?Primary Care Provider: Charlynne Cousins Other Clinician: ?Referring Provider: Neomia Dear, Avanti ?Treating Provider/Extender: Jeri Cos ?Weeks in Treatment: 0 ?Debridement Performed for ?Wound #1 Midline Sacrum ?Assessment: ?Performed By: Physician Tommie Sams., PA-C ?Debridement Type: Chemical/Enzymatic/Mechanical ?Agent Used: saline gauze ?Level of Consciousness (Pre- ?Awake and Alert ?procedure): ?Pre-procedure Verification/Time Out ?Yes - 13:30 ?Taken: ?Pain Control: Lidocaine 4% Topical Solution ?Instrument: ?N/A, Other : saline gauze ?Bleeding: None ?Response to Treatment: Procedure was tolerated well ?Level of Consciousness (Post- ?Awake and Alert ?procedure): ?Post Debridement Measurements of Total Wound ?Length: (cm) 1 ?Stage: Category/Stage III ?Width: (cm) 0.3 ?Depth: (cm) 0.2 ?Volume: (cm?) 0.047 ?Character of Wound/Ulcer Post Debridement: Stable ?Post Procedure Diagnosis ?Same as Pre-procedure ?Electronic  Signature(s) ?Signed: 09/15/2021 1:57:25 PM By: Levora Dredge ?Signed: 09/15/2021 4:41:50 PM By: Worthy Keeler PA-C ?Entered By: Levora Dredge on 09/15/2021 13:57:25 ?Katie Singh, Katie Singh ([id number]) ?-------------------------------------------------------------------------------- ?HPI Details ?Patient Name: Katie Singh, Katie Singh. ?Date of Service: 09/15/2021 12:45 PM ?Medical Record Number: [id number] ?Patient Account Number: [id number] ?Date of Birth/Sex: [birth year] (71 y.o. F) ?Treating RN: Levora Dredge ?Primary Care Provider: Charlynne Cousins Other Clinician: ?Referring Provider: Neomia Dear, Avanti ?Treating Provider/Extender: Jeri Cos ?Weeks in Treatment: 0 ?History of Present Illness ?HPI Description: 09-15-2021 upon evaluation today patient presents for initial evaluation here in the clinic concerning issues she has been having ?a wound over the sacral region. The only thing that we can determine that may have led to this starting is that she did have a period of time ?where she was sleeping on the couch. She has been taking care of her husband he recently passed. Her son has been helping take care of her at ?this point. With that being said she tells me that she is back in the bed now so that should not be an issue. She has been using Santyl as ?recommended by her primary care provider and subsequently this has helped to clean up the surface of the wound quite well which is good news ?but there is a lot of irritation around which I believe may be moisture related. I do not think this is related to yeast but at the same time that ?something that we can have to keep a close eye on. ?Patient has a history of hypertension, chronic kidney disease stage III, atrial flutter for which she has recently undergone an ablation, and ?diabetes mellitus type 2 with diabetic neuropathy. Her most recent hemoglobin A1c was on 08-30-2021 and was 5.8. She has been a diabetic for ?10 years. ?Electronic Signature(s) ?Signed: 09/15/2021 1:37:59 PM  By: Worthy Keeler PA-C ?Entered By: Worthy Keeler on 09/15/2021 13:37:59 ?Katie Singh, Katie Singh ([id number]) ?-------------------------------------------------------------------------------- ?Physical Exam Details ?Patient Name: Katie Singh, Katie Singh. ?Date of Service: 09/15/2021 12:45 PM ?Medical Record Number: [id number] ?Patient Account Number: [id number] ?Date  of Birth/Sex: 1951-04-16 (71 y.o. F) ?Treating RN: Levora Dredge ?Primary Care Provider: Charlynne Cousins Other Clinician: ?Referring Provider: Neomia Dear, Avanti ?Treating Provider/Extender: Jeri Cos ?Weeks in Treatment: 0 ?Constitutional ?sitting or standing blood pressure is within target range for patient.. pulse regular and within target range for patient.Marland Kitchen respirations regular, non- ?labored and within target range for patient.Marland Kitchen temperature within target range for patient.. Well-nourished and well-hydrated in no acute distress. ?Eyes ?conjunctiva clear no eyelid edema noted. pupils equal round and reactive to light and accommodation. ?Ears, Nose, Mouth, and Throat ?no gross abnormality of ear auricles or external auditory canals. normal hearing noted during conversation. mucus membranes moist. ?Respiratory ?normal breathing without difficulty. ?Cardiovascular ?2+ dorsalis pedis/posterior tibialis pulses. no clubbing, cyanosis, significant edema, <3 sec cap refill. ?Musculoskeletal ?normal gait and posture. no significant deformity or arthritic changes, no loss or range of motion, no clubbing. ?Psychiatric ?this patient is able to make decisions and demonstrates good insight into disease process. Alert and Oriented x 3. pleasant and cooperative. ?Notes ?Upon inspection patient's wound bed actually showed signs of being fairly decent as far as the overall appearance there was minimal slough noted ?I was able to clean this off with a sterile Q-tip and gauze she tolerated this today without complication and it appears to be that the wound bed is ?fairly clean. I do see  a lot of moisture around the edges on the recommend silver alginate dressing as probably the initial best step. I will see how ?this does I am going to suggest that they change this daily. ?Electronic Signature(s) ?Signed: 09/15/2021 1:38:30 PM By: Worthy Keeler PA-C ?Entered By: Worthy Keeler on 09/15/2021 13:38:30 ?Katie Singh, Katie Singh ([id number]) ?-------------------------------------------------------------------------------- ?Physician Orders Details ?Patient Name: Katie Singh, Katie Singh. ?Date of Service: 09/15/2021 12:45 PM ?Medical Record Number: [id number] ?Patient Account Number: [id number] ?Date of Birth/Sex: 10/25/1950 (70 y.o. 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 ?o Nurse Visit as needed ?Bathing/ Shower/ Hygiene ?o Wash wounds with antibacterial soap and water. ?o May shower; gently cleanse wound with antibacterial soap, rinse and pat dry prior to dressing wounds ?o No tub bath. ?Off-Loading ?o Turn and reposition every 2 hours ?Wound Treatment ?Wound #1 - Sacrum Wound Laterality: Midline ?Cleanser: Soap and Water 1 x Per Day/30 Days ?Discharge Instructions: Gently cleanse wound with antibacterial soap, rinse and pat dry prior to dressing wounds ?Primary Dressing: Silvercel 4 1/4x 4 1/4 (in/in) (DME) (Generic) 1 x Per Day/30 Days ?Discharge Instructions: Apply Silvercel 4 1/4x 4 1/4 (in/in) as instructed ?Secondary Dressing: (SILICONE BORDER) Zetuvit Plus SILICONE BORDER Dressing  4x4 (in/in) (DME) (Generic) 1 x Per Day/30 Days ?Discharge Instructions: Please do not put silicone bordered dressings under wraps. Use non-bordered dressing only. ?Electronic Signature(s) ?Signed: 09/15/2021 3:51:04 PM

## 2021-09-15 NOTE — Progress Notes (Signed)
MALAYASIA, MIRKIN ([id number]) ?Visit Report for 09/15/2021 ?Allergy List Details ?Patient Name: Katie Singh, Katie Singh. ?Date of Service: 09/15/2021 12:45 PM ?Medical Record Number: [id number] ?Patient Account Number: [id number] ?Date of Birth/Sex: 12-Jan-1951 (71 y.o. F) ?Treating RN: Levora Dredge ?Primary Care Hang Ammon: Charlynne Cousins Other Clinician: ?Referring Katrenia Alkins: Neomia Dear, Avanti ?Treating Ozias Dicenzo/Extender: Jeri Cos ?Weeks in Treatment: 0 ?Allergies ?Active Allergies ?clarithromycin ?Reaction: rash ?Abilify ?amoxicillin ?Reaction: hives ?ciprofloxacin ?Iodinated Contrast Media ?lisinopril ?Reaction: cough ?lorcaserin ?Macrodantin ?olanzapine ?pantoprazole ?Reaction: diarrhea ?Sulfa (Sulfonamide Antibiotics) ?penicillin ?Reaction: rash ?Allergy Notes ?Electronic Signature(s) ?Signed: 09/15/2021 1:18:10 PM By: Levora Dredge ?Entered By: Levora Dredge on 09/15/2021 13:18:09 ?JAZMINA, MUHLENKAMP ([id number]) ?-------------------------------------------------------------------------------- ?Arrival Information Details ?Patient Name: Katie Singh. ?Date of Service: 09/15/2021 12:45 PM ?Medical Record Number: [id number] ?Patient Account Number: [id number] ?Date of Birth/Sex: March 06, 1951 (71 y.o. F) ?Treating RN: Levora Dredge ?Primary Care Raheem Kolbe: Charlynne Cousins Other Clinician: ?Referring Hartley Wyke: Neomia Dear, Avanti ?Treating Hanaa Payes/Extender: Jeri Cos ?Weeks in Treatment: 0 ?Visit Information ?Patient Arrived: Ambulatory ?Arrival Time: 12:43 ?Accompanied By: self ?Transfer Assistance: None ?Patient Identification Verified: Yes ?Secondary Verification Process Completed: Yes ?Patient Has Alerts: Yes ?Patient Alerts: Patient on Blood Thinner ?type 2 diabetic ?Electronic Signature(s) ?Signed: 09/15/2021 1:53:34 PM By: Levora Dredge ?Entered By: Levora Dredge on 09/15/2021 13:53:33 ?DEYANIRA, FESLER ([id number]) ?-------------------------------------------------------------------------------- ?Clinic Level of Care Assessment  Details ?Patient Name: Katie Singh. ?Date of Service: 09/15/2021 12:45 PM ?Medical Record Number: [id number] ?Patient Account Number: [id number] ?Date of Birth/Sex: [birth year] (71 y.o. F) ?Treating RN: Levora Dredge ?Primary Care Shanena Pellegrino: Charlynne Cousins Other Clinician: ?Referring Brena Windsor: Neomia Dear, Avanti ?Treating Tawnie Ehresman/Extender: Jeri Cos ?Weeks in Treatment: 0 ?Clinic Level of Care Assessment Items ?TOOL 2 Quantity Score ?'[]'$  - Use when only an EandM is performed on the INITIAL visit 0 ?ASSESSMENTS - Nursing Assessment / Reassessment ?'[]'$  - General Physical Exam (combine w/ comprehensive assessment (listed just below) when performed on new ?0 ?pt. evals) ?X- 1 25 ?Comprehensive Assessment (HX, ROS, Risk Assessments, Wounds Hx, etc.) ?ASSESSMENTS - Wound and Skin Assessment / Reassessment ?X - Simple Wound Assessment / Reassessment - one wound 1 5 ?'[]'$  - 0 ?Complex Wound Assessment / Reassessment - multiple wounds ?'[]'$  - 0 ?Dermatologic / Skin Assessment (not related to wound area) ?ASSESSMENTS - Ostomy and/or Continence Assessment and Care ?'[]'$  - Incontinence Assessment and Management 0 ?'[]'$  - 0 ?Ostomy Care Assessment and Management (repouching, etc.) ?PROCESS - Coordination of Care ?X - Simple Patient / Family Education for ongoing care 1 15 ?'[]'$  - 0 ?Complex (extensive) Patient / Family Education for ongoing care ?X- 1 10 ?Staff obtains Consents, Records, Test Results / Process Orders ?'[]'$  - 0 ?Staff telephones HHA, Nursing Homes / Clarify orders / etc ?'[]'$  - 0 ?Routine Transfer to another Facility (non-emergent condition) ?'[]'$  - 0 ?Routine Hospital Admission (non-emergent condition) ?X- 1 15 ?New Admissions / Biomedical engineer / Ordering NPWT, Apligraf, etc. ?'[]'$  - 0 ?Emergency Hospital Admission (emergent condition) ?X- 1 10 ?Simple Discharge Coordination ?'[]'$  - 0 ?Complex (extensive) Discharge Coordination ?PROCESS - Special Needs ?'[]'$  - Pediatric / Minor Patient Management 0 ?'[]'$  - 0 ?Isolation Patient  Management ?'[]'$  - 0 ?Hearing / Language / Visual special needs ?'[]'$  - 0 ?Assessment of Community assistance (transportation, D/C planning, etc.) ?'[]'$  - 0 ?Additional assistance / Altered mentation ?'[]'$  - 0 ?Support Surface(s) Assessment (bed, cushion, seat, etc.) ?INTERVENTIONS - Wound Cleansing / Measurement ?X - Wound Imaging (photographs - any number of wounds) 1 5 ?'[]'$  - 0 ?Wound Tracing (instead of photographs) ?X- 1 5 ?Simple  Wound Measurement - one wound ?'[]'$  - 0 ?Complex Wound Measurement - multiple wounds ?GERMANI, GAVILANES ([id number]) ?X- 1 5 ?Simple Wound Cleansing - one wound ?'[]'$  - 0 ?Complex Wound Cleansing - multiple wounds ?INTERVENTIONS - Wound Dressings ?X - Small Wound Dressing one or multiple wounds 1 10 ?'[]'$  - 0 ?Medium Wound Dressing one or multiple wounds ?'[]'$  - 0 ?Large Wound Dressing one or multiple wounds ?'[]'$  - 0 ?Application of Medications - injection ?INTERVENTIONS - Miscellaneous ?'[]'$  - External ear exam 0 ?'[]'$  - 0 ?Specimen Collection (cultures, biopsies, blood, body fluids, etc.) ?'[]'$  - 0 ?Specimen(s) / Culture(s) sent or taken to Lab for analysis ?'[]'$  - 0 ?Patient Transfer (multiple staff / Civil Service fast streamer / Similar devices) ?'[]'$  - 0 ?Simple Staple / Suture removal (25 or less) ?'[]'$  - 0 ?Complex Staple / Suture removal (26 or more) ?'[]'$  - 0 ?Hypo / Hyperglycemic Management (close monitor of Blood Glucose) ?'[]'$  - 0 ?Ankle / Brachial Index (ABI) - do not check if billed separately ?Has the patient been seen at the hospital within the last three years: Yes ?Total Score: 105 ?Level Of Care: New/Established - Level ?3 ?Electronic Signature(s) ?Signed: 09/15/2021 3:51:04 PM By: Levora Dredge ?Entered By: Levora Dredge on 09/15/2021 13:58:34 ?STEFAN, KAREN ([id number]) ?-------------------------------------------------------------------------------- ?Encounter Discharge Information Details ?Patient Name: BRENNEN, GARDINER. ?Date of Service: 09/15/2021 12:45 PM ?Medical Record Number: [id number] ?Patient Account  Number: [id number] ?Date of Birth/Sex: 05-06-1951 (71 y.o. F) ?Treating RN: Levora Dredge ?Primary Care Ciella Obi: Charlynne Cousins Other Clinician: ?Referring Zhanna Melin: Neomia Dear, Avanti ?Treating Aldean Pipe/Extender: Jeri Cos ?Weeks in Treatment: 0 ?Encounter Discharge Information Items Post Procedure Vitals ?Discharge Condition: Stable ?Temperature (?F): 97.9 ?Ambulatory Status: Ambulatory ?Pulse (bpm): 66 ?Discharge Destination: Home ?Respiratory Rate (breaths/min): 18 ?Transportation: Private Auto ?Blood Pressure (mmHg): 114/74 ?Accompanied By: son ?Schedule Follow-up Appointment: Yes ?Clinical Summary of Care: Patient Declined ?Electronic Signature(s) ?Signed: 09/15/2021 1:59:46 PM By: Levora Dredge ?Entered By: Levora Dredge on 09/15/2021 13:59:45 ?MUSLIMA, TOPPINS ([id number]) ?-------------------------------------------------------------------------------- ?Lower Extremity Assessment Details ?Patient Name: ALTAIR, STANKO. ?Date of Service: 09/15/2021 12:45 PM ?Medical Record Number: [id number] ?Patient Account Number: [id number] ?Date of Birth/Sex: 1950-11-11 (71 y.o. F) ?Treating RN: Levora Dredge ?Primary Care Penina Reisner: Charlynne Cousins Other Clinician: ?Referring Lowen Barringer: Neomia Dear, Avanti ?Treating Niaja Stickley/Extender: Jeri Cos ?Weeks in Treatment: 0 ?Electronic Signature(s) ?Signed: 09/15/2021 3:51:04 PM By: Levora Dredge ?Entered By: Levora Dredge on 09/15/2021 13:01:49 ?TAYDEN, DURAN ([id number]) ?-------------------------------------------------------------------------------- ?Multi Wound Chart Details ?Patient Name: CYANNA, NEACE. ?Date of Service: 09/15/2021 12:45 PM ?Medical Record Number: [id number] ?Patient Account Number: [id number] ?Date of Birth/Sex: 23-Aug-1950 (71 y.o. F) ?Treating RN: Levora Dredge ?Primary Care Rachit Grim: Charlynne Cousins Other Clinician: ?Referring Blessing Ozga: Neomia Dear, Avanti ?Treating Nathaly Dawkins/Extender: Jeri Cos ?Weeks in Treatment: 0 ?Vital Signs ?Height(in): 68 ?Pulse(bpm):  66 ?Weight(lbs): 220 ?Blood Pressure(mmHg): 114/74 ?Body Mass Index(BMI): 33.4 ?Temperature(??F): 97.9 ?Respiratory Rate(breaths/min): 18 ?Photos: [N/A:N/A] ?Wound Location: Midline Sacrum N/A N/A ?Wounding Event

## 2021-09-17 DIAGNOSIS — Z8679 Personal history of other diseases of the circulatory system: Secondary | ICD-10-CM | POA: Diagnosis not present

## 2021-09-18 ENCOUNTER — Other Ambulatory Visit: Payer: Self-pay

## 2021-09-18 ENCOUNTER — Emergency Department: Payer: Medicare HMO

## 2021-09-18 ENCOUNTER — Emergency Department
Admission: EM | Admit: 2021-09-18 | Discharge: 2021-09-18 | Disposition: A | Discharge status: Home / Self Care | Payer: Medicare HMO | Attending: Physician Assistant | Admitting: Physician Assistant

## 2021-09-18 DIAGNOSIS — I4891 Unspecified atrial fibrillation: Secondary | ICD-10-CM | POA: Diagnosis not present

## 2021-09-18 DIAGNOSIS — G4733 Obstructive sleep apnea (adult) (pediatric): Secondary | ICD-10-CM | POA: Diagnosis not present

## 2021-09-18 DIAGNOSIS — L89153 Pressure ulcer of sacral region, stage 3: Secondary | ICD-10-CM | POA: Diagnosis not present

## 2021-09-18 DIAGNOSIS — R519 Headache, unspecified: Secondary | ICD-10-CM | POA: Diagnosis not present

## 2021-09-18 DIAGNOSIS — K1121 Acute sialoadenitis: Secondary | ICD-10-CM | POA: Diagnosis not present

## 2021-09-18 DIAGNOSIS — F32A Depression, unspecified: Secondary | ICD-10-CM | POA: Diagnosis not present

## 2021-09-18 DIAGNOSIS — K1120 Sialoadenitis, unspecified: Secondary | ICD-10-CM | POA: Diagnosis not present

## 2021-09-18 DIAGNOSIS — N1831 Chronic kidney disease, stage 3a: Secondary | ICD-10-CM | POA: Diagnosis not present

## 2021-09-18 DIAGNOSIS — R1310 Dysphagia, unspecified: Secondary | ICD-10-CM | POA: Diagnosis not present

## 2021-09-18 DIAGNOSIS — R490 Dysphonia: Secondary | ICD-10-CM | POA: Insufficient documentation

## 2021-09-18 DIAGNOSIS — I5020 Unspecified systolic (congestive) heart failure: Secondary | ICD-10-CM | POA: Diagnosis not present

## 2021-09-18 DIAGNOSIS — R220 Localized swelling, mass and lump, head: Secondary | ICD-10-CM | POA: Diagnosis not present

## 2021-09-18 DIAGNOSIS — R410 Disorientation, unspecified: Secondary | ICD-10-CM | POA: Insufficient documentation

## 2021-09-18 DIAGNOSIS — N183 Chronic kidney disease, stage 3 unspecified: Secondary | ICD-10-CM | POA: Diagnosis not present

## 2021-09-18 DIAGNOSIS — I129 Hypertensive chronic kidney disease with stage 1 through stage 4 chronic kidney disease, or unspecified chronic kidney disease: Secondary | ICD-10-CM | POA: Diagnosis not present

## 2021-09-18 DIAGNOSIS — K117 Disturbances of salivary secretion: Secondary | ICD-10-CM | POA: Diagnosis not present

## 2021-09-18 DIAGNOSIS — E1122 Type 2 diabetes mellitus with diabetic chronic kidney disease: Secondary | ICD-10-CM | POA: Diagnosis not present

## 2021-09-18 DIAGNOSIS — I5022 Chronic systolic (congestive) heart failure: Secondary | ICD-10-CM | POA: Diagnosis not present

## 2021-09-18 DIAGNOSIS — L539 Erythematous condition, unspecified: Secondary | ICD-10-CM | POA: Diagnosis not present

## 2021-09-18 DIAGNOSIS — Z5321 Procedure and treatment not carried out due to patient leaving prior to being seen by health care provider: Secondary | ICD-10-CM | POA: Diagnosis not present

## 2021-09-18 DIAGNOSIS — M6281 Muscle weakness (generalized): Secondary | ICD-10-CM | POA: Diagnosis not present

## 2021-09-18 DIAGNOSIS — I130 Hypertensive heart and chronic kidney disease with heart failure and stage 1 through stage 4 chronic kidney disease, or unspecified chronic kidney disease: Secondary | ICD-10-CM | POA: Diagnosis not present

## 2021-09-18 DIAGNOSIS — R682 Dry mouth, unspecified: Secondary | ICD-10-CM | POA: Diagnosis not present

## 2021-09-18 DIAGNOSIS — I4892 Unspecified atrial flutter: Secondary | ICD-10-CM | POA: Diagnosis not present

## 2021-09-18 DIAGNOSIS — I5023 Acute on chronic systolic (congestive) heart failure: Secondary | ICD-10-CM | POA: Diagnosis not present

## 2021-09-18 DIAGNOSIS — G4700 Insomnia, unspecified: Secondary | ICD-10-CM | POA: Diagnosis not present

## 2021-09-18 DIAGNOSIS — G43909 Migraine, unspecified, not intractable, without status migrainosus: Secondary | ICD-10-CM | POA: Diagnosis not present

## 2021-09-18 DIAGNOSIS — N179 Acute kidney failure, unspecified: Secondary | ICD-10-CM | POA: Diagnosis not present

## 2021-09-18 DIAGNOSIS — R4182 Altered mental status, unspecified: Secondary | ICD-10-CM | POA: Diagnosis not present

## 2021-09-18 DIAGNOSIS — I110 Hypertensive heart disease with heart failure: Secondary | ICD-10-CM | POA: Diagnosis not present

## 2021-09-18 DIAGNOSIS — M1A9XX0 Chronic gout, unspecified, without tophus (tophi): Secondary | ICD-10-CM | POA: Diagnosis not present

## 2021-09-18 DIAGNOSIS — E039 Hypothyroidism, unspecified: Secondary | ICD-10-CM | POA: Diagnosis not present

## 2021-09-18 LAB — CBC WITH DIFFERENTIAL/PLATELET
Abs Immature Granulocytes: 0.09 10*3/uL — ABNORMAL HIGH (ref 0.00–0.07)
Basophils Absolute: 0.1 10*3/uL (ref 0.0–0.1)
Basophils Relative: 0 %
Eosinophils Absolute: 0 10*3/uL (ref 0.0–0.5)
Eosinophils Relative: 0 %
HCT: 43.3 % (ref 36.0–46.0)
Hemoglobin: 14 g/dL (ref 12.0–15.0)
Immature Granulocytes: 1 %
Lymphocytes Relative: 6 %
Lymphs Abs: 0.8 10*3/uL (ref 0.7–4.0)
MCH: 26.1 pg (ref 26.0–34.0)
MCHC: 32.3 g/dL (ref 30.0–36.0)
MCV: 80.6 fL (ref 80.0–100.0)
Monocytes Absolute: 0.9 10*3/uL (ref 0.1–1.0)
Monocytes Relative: 7 %
Neutro Abs: 11.5 10*3/uL — ABNORMAL HIGH (ref 1.7–7.7)
Neutrophils Relative %: 86 %
Platelets: 267 10*3/uL (ref 150–400)
RBC: 5.37 MIL/uL — ABNORMAL HIGH (ref 3.87–5.11)
RDW: 12.5 % (ref 11.5–15.5)
WBC: 13.4 10*3/uL — ABNORMAL HIGH (ref 4.0–10.5)
nRBC: 0 % (ref 0.0–0.2)

## 2021-09-18 LAB — BASIC METABOLIC PANEL
Anion gap: 12 (ref 5–15)
BUN: 45 mg/dL — ABNORMAL HIGH (ref 8–23)
CO2: 21 mmol/L — ABNORMAL LOW (ref 22–32)
Calcium: 9.9 mg/dL (ref 8.9–10.3)
Chloride: 100 mmol/L (ref 98–111)
Creatinine, Ser: 2.78 mg/dL — ABNORMAL HIGH (ref 0.44–1.00)
GFR, Estimated: 18 mL/min — ABNORMAL LOW (ref >60)
Glucose, Bld: 124 mg/dL — ABNORMAL HIGH (ref 70–99)
Potassium: 3.5 mmol/L (ref 3.5–5.1)
Sodium: 133 mmol/L — ABNORMAL LOW (ref 135–145)

## 2021-09-18 LAB — LACTIC ACID, PLASMA: Lactic Acid, Venous: 1.4 mmol/L (ref 0.5–1.9)

## 2021-09-18 NOTE — ED Triage Notes (Signed)
Pt started having right facial swelling, Pt has had difficulty swallowing and a hoarse voice. Pt daughter says she has more confusion today than normal.  ?

## 2021-09-18 NOTE — ED Provider Triage Note (Signed)
Emergency Medicine Provider Triage Evaluation Note ? ?Katie Singh, a 71 y.o. female  was evaluated in triage.  Pt complains of significant swelling to the right angle of the jaw.  Her daughter reports onset of symptoms on Thursday.  Patient reports dry mouth which may be baseline as well as difficulty opening the jaw secondary to pain and tightness in the ear.  She denies any known fevers, chills, or sweats.  No recent dental work is reported. ? ?Review of Systems  ?Positive: Right facial swelling  ?Negative: FCS ? ?Physical Exam  ?There were no vitals taken for this visit. ?Gen:   Awake, no distress   ?Resp:  Normal effort CTA ?MSK:   Moves extremities without difficulty  ?HENT:  Significant swelling, firmness and tenderness at the angle of the jaw causing protrusion of the right ear.  Patient also with some erythema noted to the submandible region. ? ?Medical Decision Making  ?Medically screening exam initiated at 8:16 PM.  Appropriate orders placed.  Katie Singh was informed that the remainder of the evaluation will be completed by another provider, this initial triage assessment does not replace that evaluation, and the importance of remaining in the ED until their evaluation is complete. ? ?Geriatric patient with history of CKD, hypertension, diabetes, presents to the ED with significant swelling to the angle of the right jaw and erythema to the submental region. ?  ?Melvenia Needles, PA-C ?09/18/21 2024 ? ?

## 2021-09-18 NOTE — ED Notes (Signed)
Reviewed pt's chart & CT results with Dr Archie Balboa; pt does not need immediate rooming at this time per MD ?

## 2021-09-19 DIAGNOSIS — K1120 Sialoadenitis, unspecified: Secondary | ICD-10-CM | POA: Diagnosis not present

## 2021-09-20 DIAGNOSIS — K1120 Sialoadenitis, unspecified: Secondary | ICD-10-CM | POA: Diagnosis not present

## 2021-09-22 ENCOUNTER — Ambulatory Visit: Payer: Medicare HMO | Admitting: Physician Assistant

## 2021-09-25 ENCOUNTER — Ambulatory Visit: Payer: Self-pay | Admitting: *Deleted

## 2021-09-25 NOTE — Telephone Encounter (Signed)
?  Chief Complaint: diarrhea- on antibiotic ?Symptoms: diarrhea, nausea ?Frequency: 09/18/21- ED parotis ?Pertinent Negatives: Patient denies dry mouth [not just dry lips], too weak to stand, dizziness, new weight loss ?Disposition: '[]'$ ED /'[]'$ Urgent Care (no appt availability in office) / '[x]'$ Appointment(In office/virtual)/ '[]'$  [facility]/ '[]'$ Home Care/ '[]'$ Refused Recommended Disposition /'[]'$ [place] [facility]/ '[]'$  Follow-up with PCP ?Additional Notes: Patient states the diarrhea is so bad she can't leave the house- virtual visit scheduled- aptient advised ED if she has any signs of dehydration   ?

## 2021-09-25 NOTE — Telephone Encounter (Signed)
Summary: advice - diarrhea  ? Pt called in stating she was recently in the ED and was given medications that she believes have given her diarrhea, pt wanted to know if something could be sent in for this and needed advice.   ?  ? ?Reason for Disposition ?? [1] SEVERE diarrhea (e.g., 7 or more times / day more than normal) AND [2] age > 60 years ? ?Answer Assessment - Initial Assessment Questions ?1. DIARRHEA SEVERITY: "How bad is the diarrhea?" "How many more stools have you had in the past 24 hours than normal?"  ?  - NO DIARRHEA (SCALE 0) ?  - MILD (SCALE 1-3): Few loose or mushy BMs; increase of 1-3 stools over normal daily number of stools; mild increase in ostomy output. ?  -  MODERATE (SCALE 4-7): Increase of 4-6 stools daily over normal; moderate increase in ostomy output. ?* SEVERE (SCALE 8-10; OR 'WORST POSSIBLE'): Increase of 7 or more stools daily over normal; moderate increase in ostomy output; incontinence. ?    severe ?2. ONSET: "When did the diarrhea begin?"  ?    09/23/21 ?3. BM CONSISTENCY: "How loose or watery is the diarrhea?"  ?    loose ?4. VOMITING: "Are you also vomiting?" If Yes, ask: "How many times in the past 24 hours?"  ?    No- nausea ?5. ABDOMINAL PAIN: "Are you having any abdominal pain?" If Yes, ask: "What does it feel like?" (e.g., crampy, dull, intermittent, constant)  ?    yes ?6. ABDOMINAL PAIN SEVERITY: If present, ask: "How bad is the pain?"  (e.g., Scale 1-10; mild, moderate, or severe) ?  - MILD (1-3): doesn't interfere with normal activities, abdomen soft and not tender to touch  ?  - MODERATE (4-7): interferes with normal activities or awakens from sleep, abdomen tender to touch  ?  - SEVERE (8-10): excruciating pain, doubled over, unable to do any normal activities   ?    Moderate/severe ?7. ORAL INTAKE: If vomiting, "Have you been able to drink liquids?" "How much liquids have you had in the past 24 hours?" ?    Water- 20 oz ?8. HYDRATION: "Any signs of dehydration?" (e.g.,  dry mouth [not just dry lips], too weak to stand, dizziness, new weight loss) "When did you last urinate?" ?    No- every time she has to have BM ?9. EXPOSURE: "Have you traveled to a foreign country recently?" "Have you been exposed to anyone with diarrhea?" "Could you have eaten any food that was spoiled?" ?    no ?10. ANTIBIOTIC USE: "Are you taking antibiotics now or have you taken antibiotics in the past 2 months?" ?      Started antibiotic 1 week ago- treating infection - parotis ?11. OTHER SYMPTOMS: "Do you have any other symptoms?" (e.g., fever, blood in stool) ?      fever ?12. PREGNANCY: "Is there any chance you are pregnant?" "When was your last menstrual period?" ?      *No Answer* ? ?Protocols used: Diarrhea-A-AH ? ?

## 2021-09-26 ENCOUNTER — Telehealth: Payer: Medicare HMO | Admitting: Internal Medicine

## 2021-09-27 ENCOUNTER — Telehealth: Payer: Self-pay

## 2021-09-27 NOTE — Telephone Encounter (Signed)
[facility] calling to let Dr Neomia Dear know that they have had issues getting in touch with pt/family got in touch with son today and scheduled for inpatient PT to start tomorrow on 09/28/21.  ?

## 2021-09-28 ENCOUNTER — Ambulatory Visit: Payer: Medicare HMO | Admitting: Internal Medicine

## 2021-09-28 ENCOUNTER — Other Ambulatory Visit: Payer: Self-pay | Admitting: Internal Medicine

## 2021-09-28 ENCOUNTER — Telehealth: Payer: Self-pay | Admitting: Internal Medicine

## 2021-09-28 NOTE — Telephone Encounter (Signed)
Requested medication (s) are due for refill today - no  Requested medication (s) are on the active medication list -no- not at this dose  Future visit scheduled -today  Last refill: unsure  Notes to clinic: This dose is not the current dose on patient medication list- patient has appointment today.  Requested Prescriptions  Pending Prescriptions Disp Refills   levothyroxine (SYNTHROID) 50 MCG tablet [Pharmacy Med Name: LEVOTHYROXINE SODIUM 50 MCG TAB] 30 tablet     Sig: TAKE 1 TABLET BY MOUTH DAILY BEFORE BREAKFAST     Endocrinology:  Hypothyroid Agents Passed - 09/28/2021  1:34 PM      Passed - TSH in normal range and within 360 days    TSH  Date Value Ref Range Status  06/20/2021 3.370 0.450 - 4.500 uIU/mL Final         Passed - Valid encounter within last 12 months    Recent Outpatient Visits           4 weeks ago Diabetes mellitus without complication ([place])   [facility] Vigg, Avanti, MD   3 months ago Elevated alkaline phosphatase level   [place] Vigg, Avanti, MD   4 months ago Painful urination   [place] Vigg, Avanti, MD   6 months ago Chronic gout without tophus, unspecified cause, unspecified site   [place] Vigg, Avanti, MD   7 months ago Elevated TSH   [facility] Vigg, Avanti, MD       Future Appointments             Today Vigg, Avanti, MD [place]   In 3 months  [place]                Requested Prescriptions  Pending Prescriptions Disp Refills   levothyroxine (SYNTHROID) 50 MCG tablet [Pharmacy Med Name: LEVOTHYROXINE SODIUM 50 MCG TAB] 30 tablet     Sig: TAKE 1 TABLET BY MOUTH DAILY BEFORE BREAKFAST     Endocrinology:  Hypothyroid Agents Passed - 09/28/2021  1:34 PM      Passed - TSH in normal range and within 360 days    TSH  Date Value Ref Range Status  06/20/2021 3.370 0.450 - 4.500 uIU/mL Final         Passed - Valid  encounter within last 12 months    Recent Outpatient Visits           4 weeks ago Diabetes mellitus without complication ([place])   [facility] Vigg, Avanti, MD   3 months ago Elevated alkaline phosphatase level   [place] Vigg, Avanti, MD   4 months ago Painful urination   [place] Vigg, Avanti, MD   6 months ago Chronic gout without tophus, unspecified cause, unspecified site   [facility] Vigg, Avanti, MD   7 months ago Elevated TSH   [place] Vigg, Avanti, MD       Future Appointments             Today Vigg, Avanti, MD [facility]   In 3 months  [employer], [place]

## 2021-09-28 NOTE — Telephone Encounter (Signed)
Copied from [place] [phone]. Topic: General - Other >> Sep 28, 2021  3:45 PM Tessa Lerner A wrote: Berton Lan / [facility] has caled to share that the patient has declined in home physical therapy   The patient was seen today 09/28/21 at 10 AM approx for their evaluation   The patient would prefer to be seen for Outpatient physical therapy   Please contact further if needed

## 2021-10-01 DIAGNOSIS — R404 Transient alteration of awareness: Secondary | ICD-10-CM | POA: Diagnosis not present

## 2021-10-01 DIAGNOSIS — R739 Hyperglycemia, unspecified: Secondary | ICD-10-CM | POA: Diagnosis not present

## 2021-10-01 DIAGNOSIS — I499 Cardiac arrhythmia, unspecified: Secondary | ICD-10-CM | POA: Diagnosis not present

## 2021-10-01 DIAGNOSIS — I469 Cardiac arrest, cause unspecified: Secondary | ICD-10-CM | POA: Diagnosis not present

## 2021-10-04 NOTE — Telephone Encounter (Signed)
Ok pl refer to OP PT thnx. Needs an appt to see [place] next week

## 2021-10-04 NOTE — Telephone Encounter (Signed)
Unable to reach patient, called son Eddie Dibbles, he stated that Katie Singh had passed away on 2022-08-20

## 2021-10-07 DIAGNOSIS — I5023 Acute on chronic systolic (congestive) heart failure: Secondary | ICD-10-CM | POA: Diagnosis not present

## 2021-10-10 ENCOUNTER — Telehealth: Payer: Self-pay | Admitting: Internal Medicine

## 2021-10-11 NOTE — Telephone Encounter (Signed)
Called [employer] to ask that Death Certificate can be brought to the office so that it can be signed off on.

## 2021-10-11 NOTE — Telephone Encounter (Signed)
[employer] called back and stated that they can not bring it here for signature.

## 2021-10-11 NOTE — Telephone Encounter (Signed)
[employer] Called back in asking if PCP was in the "dave" system, they are unable to bring it and needs it to be signed through that system, or if there is anyone who can. She mentioned this is very urgent and the family is getting upset because they are waiting to get her cremated, please advise.

## 2021-10-12 DIAGNOSIS — 419620001 Death: Secondary | SNOMED CT | POA: Diagnosis not present

## 2021-10-12 NOTE — Telephone Encounter (Signed)
Copied from [place] [phone]. Topic: General - Other >> 2021/10/25 10:38 AM Tessa Lerner A wrote: Reason for CRM: April with [employer] has called to request completion of the patient's death certificate   [facility] [phone]   The patient's family is requesting that cremation be completed promptly

## 2021-10-12 DEATH — deceased

## 2022-01-22 ENCOUNTER — Ambulatory Visit: Payer: Medicare Other

## 2022-01-22 ENCOUNTER — Ambulatory Visit: Payer: Self-pay

## 2022-06-01 IMAGING — MR MR LUMBAR SPINE W/O CM
5 series · 33 of 48 positions shown · non-contrast
Comparison: 09/14/2010

CLINICAL DATA: Low back pain radiating into the right leg for 2
months.

EXAM:
MRI LUMBAR SPINE WITHOUT CONTRAST
TECHNIQUE: Multiplanar, multisequence MR imaging of the lumbar spine was
performed. No intravenous contrast was administered.

[Series 5: T2 · sagittal · 4.0mm · 1.02mm/px · 6 of 17 slices shown (1 of 2)]
[im 1/17]
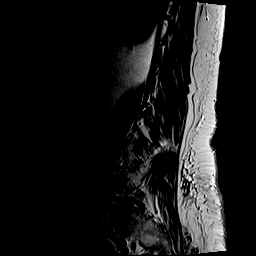
[im 4/17]
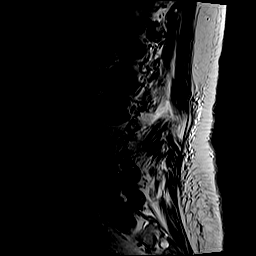
[im 7/17]
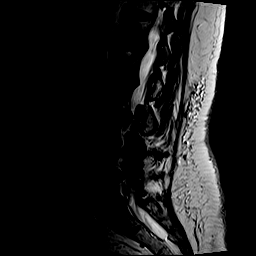
[im 10/17]
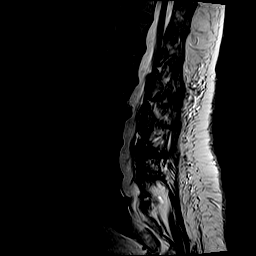
[im 13/17]
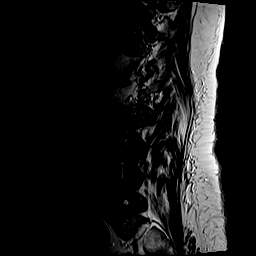
[im 17/17]
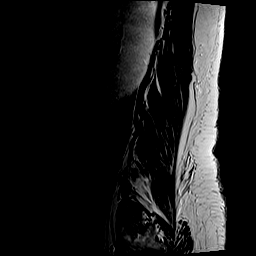

[Series 6: T1 · sagittal · 4.0mm · 1.02mm/px · 7 of 17 slices shown (1 of 2)]
[im 1/17]
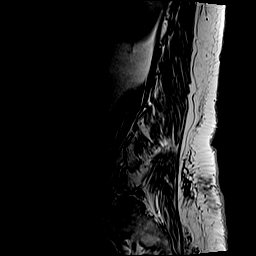
[im 3/17]
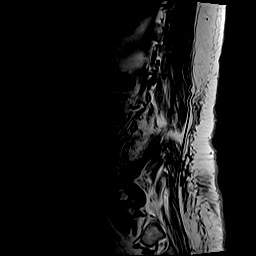
[im 6/17]
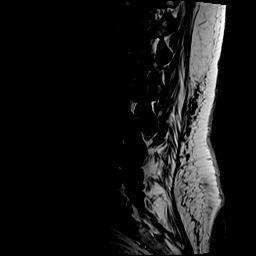
[im 9/17]
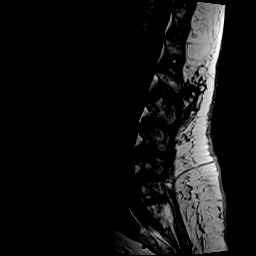
[im 11/17]
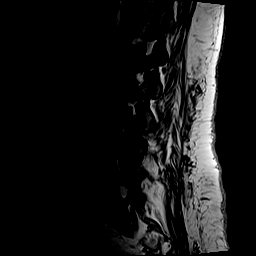
[im 14/17]
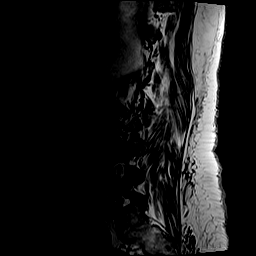
[im 17/17]
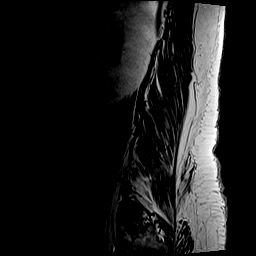

[Series 7: STIR · sagittal · 4.0mm · 0.51mm/px · 4 of 17 slices shown]
[im 1/17]
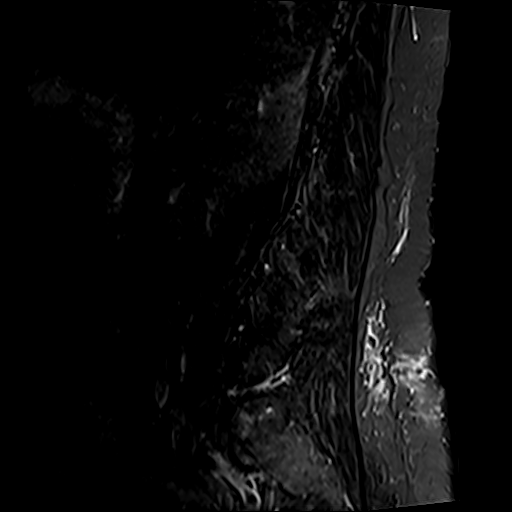
[im 3/17]
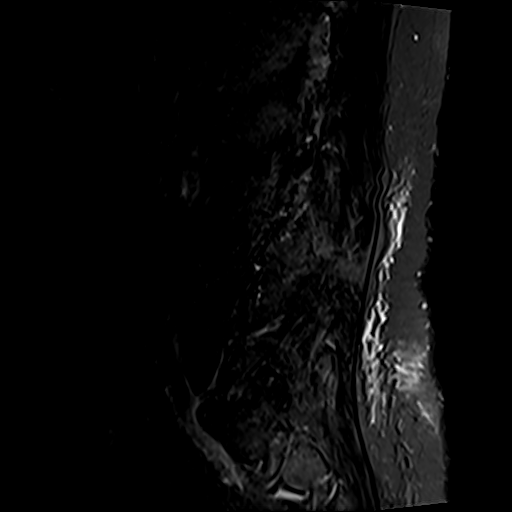
[im 6/17]
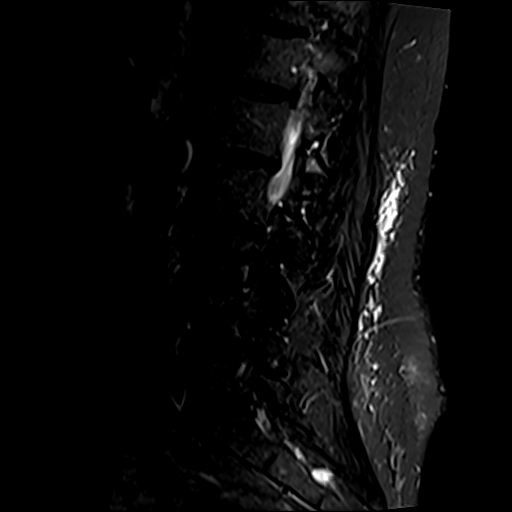
[im 9/17]
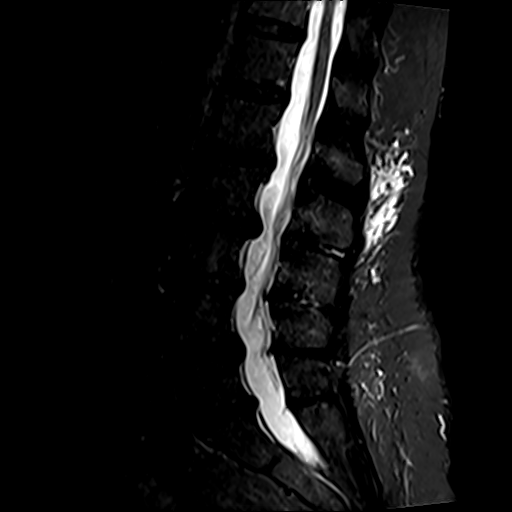

[Series 8: T2 · axial · 4.0mm · 0.78mm/px · z∈[-58,+154]mm · 8 of 36 slices shown (2 of 2)]
[im 1/36]
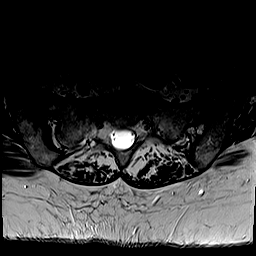
[im 6/36]
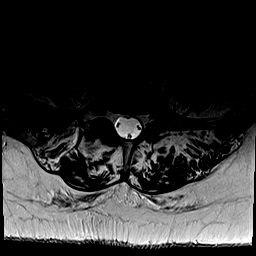
[im 11/36]
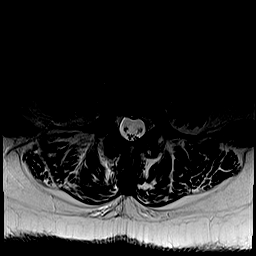
[im 17/36]
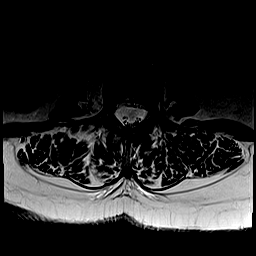
[im 19/36]
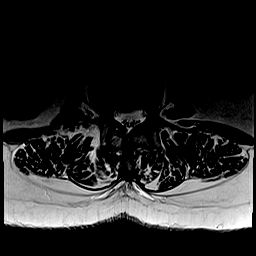
[im 25/36]
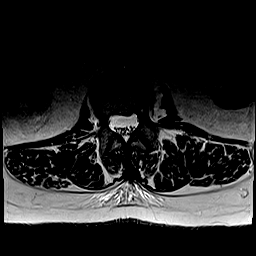
[im 30/36]
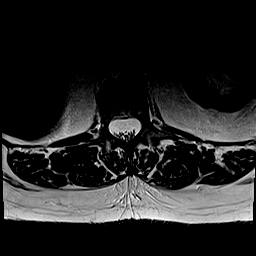
[im 36/36]
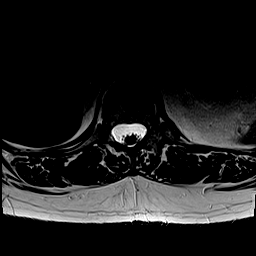

[Series 9: T1 · axial · 4.0mm · 0.39mm/px · z∈[-58,+154]mm · 8 of 36 slices shown (2 of 2)]
[im 1/36]
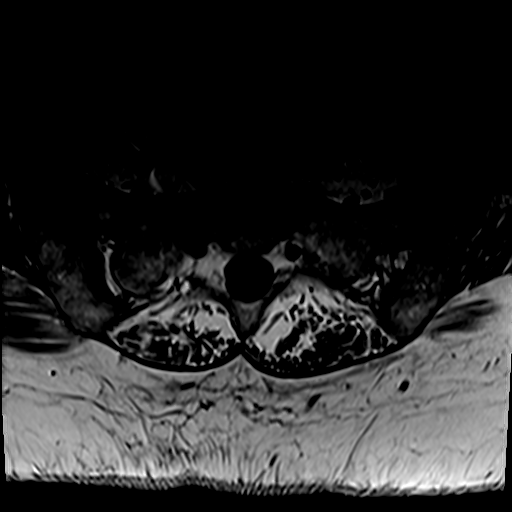
[im 6/36]
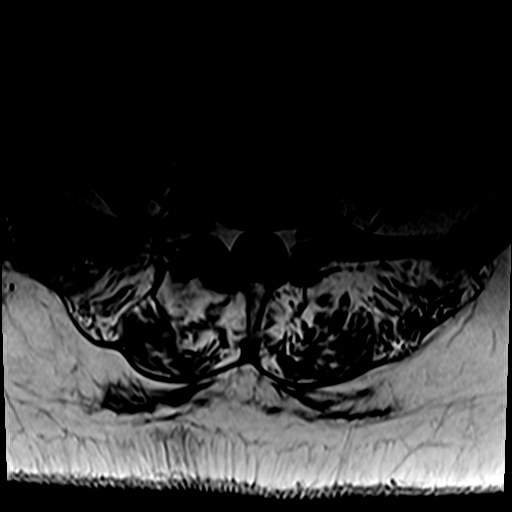
[im 11/36]
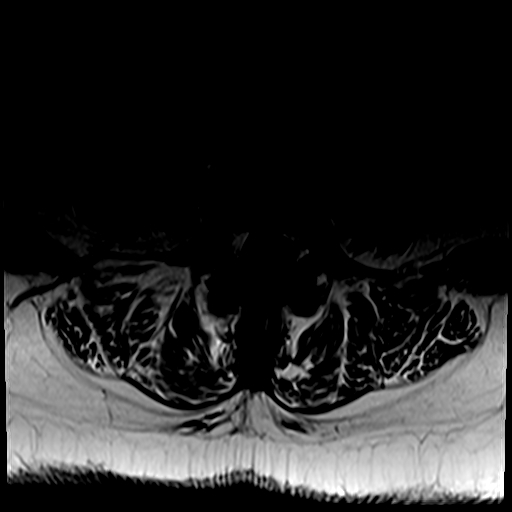
[im 17/36]
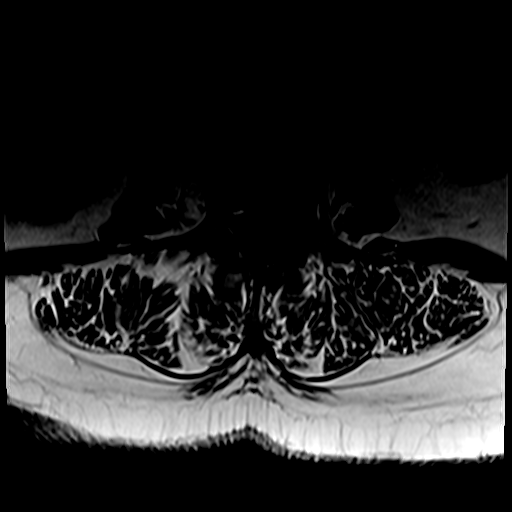
[im 19/36]
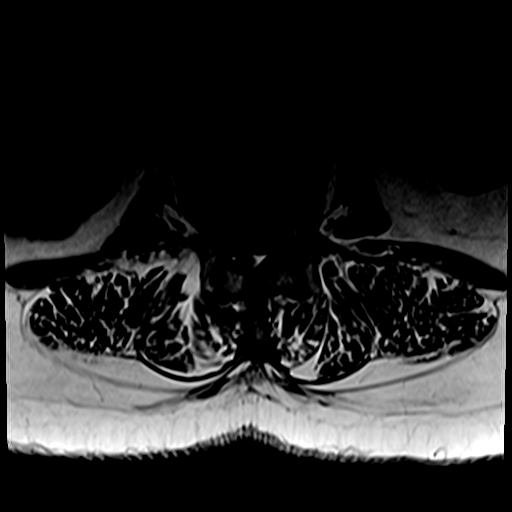
[im 25/36]
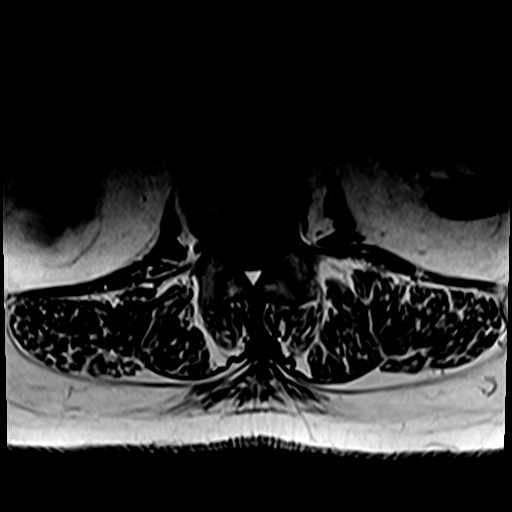
[im 30/36]
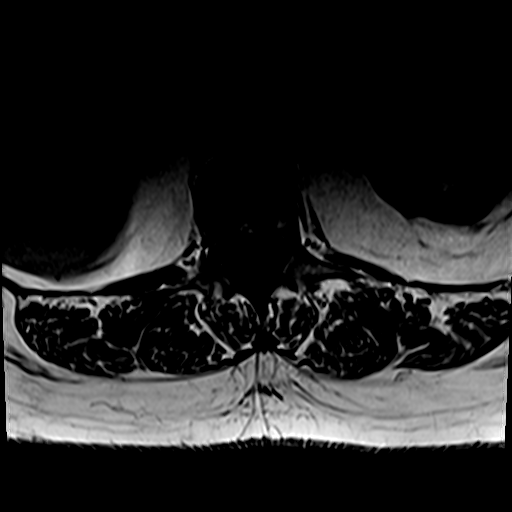
[im 36/36]
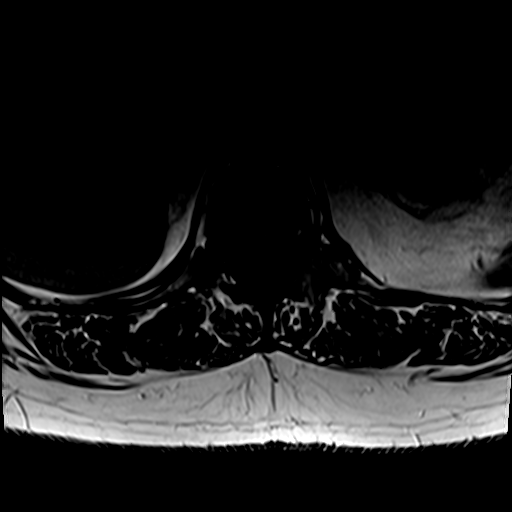

[33 of 48 positions shown; findings below may reference images not displayed]

FINDINGS: Segmentation:  Standard.

Alignment:  Mild S-shaped lumbar scoliosis.  No listhesis.

Vertebrae: No fracture, suspicious osseous lesion, or significant
marrow edema. Progressive chronic degenerative endplate changes
throughout the lumbar spine, greatest at L2-3.

Conus medullaris and cauda equina: Conus extends to the L1 level.
Conus and cauda equina appear normal.

Paraspinal and other soft tissues: Approximately 1.5 cm T2
hyperintense lesions in both kidneys, likely cysts.

Disc levels:

Disc desiccation throughout the lumbar spine. New advanced disc
space narrowing at L2-3 with moderate asymmetric right-sided
narrowing at L4-5 and milder narrowing at all other levels.

T12-L1: Minimal disc bulging without stenosis.

L1-2: Mild disc bulging, a tiny left central disc extrusion with
mild caudal migration, and mild facet hypertrophy without stenosis.

L2-3: Circumferential disc bulging, a new or larger left foraminal
disc protrusion, and mild facet and ligamentum flavum hypertrophy
result in mild spinal stenosis and mild left neural foraminal
stenosis.

L3-4: Circumferential disc bulging and mild-to-moderate facet
hypertrophy result in increased, mild bilateral neural foraminal
stenosis without spinal stenosis.

L4-5: Disc bulging, a new small right subarticular disc extrusion,
and mild facet hypertrophy result in mild right lateral recess
stenosis and mild right neural foraminal stenosis without spinal
stenosis.

L5-S1: Disc bulging, a left subarticular to left foraminal disc
protrusion, and mild facet hypertrophy result in moderate left
lateral recess stenosis and moderate left neural foraminal stenosis
potentially affecting the left L5 and left S1 nerve roots, similar
to the prior study. No spinal stenosis.
IMPRESSION: 1. Progressive multilevel lumbar disc and facet degeneration.
2. New small right subarticular disc extrusion at L4-5 with mild
right lateral recess and mild right neural foraminal stenosis.
3. Mild spinal stenosis and mild left neural foraminal stenosis at
L2-3.
4. Unchanged moderate left lateral recess and left neural foraminal
stenosis at L5-S1.
